# Patient Record
Sex: Female | Born: 1935 | Race: White | Hispanic: No | Smoking: Never smoker
Health system: Southern US, Community
[De-identification: ages and names within clinical notes are randomized; demographics above are authoritative.]

## PROBLEM LIST (undated history)

## (undated) DIAGNOSIS — Z974 Presence of external hearing-aid: Secondary | ICD-10-CM

## (undated) DIAGNOSIS — I10 Essential (primary) hypertension: Secondary | ICD-10-CM

## (undated) DIAGNOSIS — F32A Depression, unspecified: Secondary | ICD-10-CM

## (undated) DIAGNOSIS — G43909 Migraine, unspecified, not intractable, without status migrainosus: Secondary | ICD-10-CM

## (undated) DIAGNOSIS — F329 Major depressive disorder, single episode, unspecified: Secondary | ICD-10-CM

## (undated) DIAGNOSIS — E039 Hypothyroidism, unspecified: Secondary | ICD-10-CM

## (undated) DIAGNOSIS — M1990 Unspecified osteoarthritis, unspecified site: Secondary | ICD-10-CM

## (undated) DIAGNOSIS — C50919 Malignant neoplasm of unspecified site of unspecified female breast: Secondary | ICD-10-CM

## (undated) HISTORY — PX: JOINT REPLACEMENT: SHX530

## (undated) HISTORY — PX: BREAST LUMPECTOMY: SHX2

---

## 1997-08-19 ENCOUNTER — Ambulatory Visit (HOSPITAL_COMMUNITY): Admission: RE | Admit: 1997-08-19 | Discharge: 1997-08-19 | Payer: Self-pay | Admitting: Obstetrics & Gynecology

## 2004-01-21 ENCOUNTER — Ambulatory Visit: Payer: Self-pay | Admitting: Oncology

## 2004-05-10 ENCOUNTER — Ambulatory Visit: Payer: Self-pay | Admitting: Oncology

## 2004-06-01 ENCOUNTER — Ambulatory Visit: Payer: Self-pay | Admitting: Oncology

## 2004-10-11 ENCOUNTER — Ambulatory Visit: Payer: Self-pay | Admitting: Oncology

## 2005-01-11 ENCOUNTER — Ambulatory Visit: Payer: Self-pay | Admitting: Oncology

## 2005-04-14 ENCOUNTER — Ambulatory Visit: Payer: Self-pay | Admitting: Oncology

## 2005-05-04 ENCOUNTER — Ambulatory Visit: Payer: Self-pay | Admitting: Oncology

## 2005-10-13 ENCOUNTER — Ambulatory Visit: Payer: Self-pay | Admitting: Oncology

## 2005-11-01 ENCOUNTER — Ambulatory Visit: Payer: Self-pay | Admitting: Oncology

## 2006-01-12 ENCOUNTER — Ambulatory Visit: Payer: Self-pay | Admitting: Oncology

## 2006-04-17 ENCOUNTER — Ambulatory Visit: Payer: Self-pay | Admitting: Oncology

## 2006-04-21 ENCOUNTER — Ambulatory Visit: Payer: Self-pay | Admitting: Internal Medicine

## 2006-05-04 ENCOUNTER — Ambulatory Visit: Payer: Self-pay | Admitting: Oncology

## 2006-06-13 ENCOUNTER — Encounter: Payer: Self-pay | Admitting: Unknown Physician Specialty

## 2006-07-03 ENCOUNTER — Encounter: Payer: Self-pay | Admitting: Unknown Physician Specialty

## 2006-07-20 ENCOUNTER — Ambulatory Visit: Payer: Self-pay | Admitting: Physician Assistant

## 2006-10-02 ENCOUNTER — Ambulatory Visit: Payer: Self-pay | Admitting: Oncology

## 2006-10-15 ENCOUNTER — Ambulatory Visit: Payer: Self-pay | Admitting: Oncology

## 2006-11-02 ENCOUNTER — Ambulatory Visit: Payer: Self-pay | Admitting: Oncology

## 2007-01-14 ENCOUNTER — Ambulatory Visit: Payer: Self-pay | Admitting: Oncology

## 2007-01-23 ENCOUNTER — Emergency Department: Payer: Self-pay | Admitting: Emergency Medicine

## 2007-04-04 ENCOUNTER — Ambulatory Visit: Payer: Self-pay | Admitting: Oncology

## 2007-05-02 ENCOUNTER — Ambulatory Visit: Payer: Self-pay | Admitting: Oncology

## 2007-05-05 ENCOUNTER — Ambulatory Visit: Payer: Self-pay | Admitting: Oncology

## 2007-09-05 ENCOUNTER — Ambulatory Visit: Payer: Self-pay | Admitting: Oncology

## 2007-10-02 ENCOUNTER — Ambulatory Visit: Payer: Self-pay | Admitting: Oncology

## 2007-10-24 ENCOUNTER — Ambulatory Visit: Payer: Self-pay | Admitting: Oncology

## 2007-10-30 ENCOUNTER — Ambulatory Visit: Payer: Self-pay | Admitting: Unknown Physician Specialty

## 2007-11-02 ENCOUNTER — Ambulatory Visit: Payer: Self-pay | Admitting: Oncology

## 2008-01-16 ENCOUNTER — Ambulatory Visit: Payer: Self-pay | Admitting: Oncology

## 2008-04-07 ENCOUNTER — Ambulatory Visit: Payer: Self-pay | Admitting: Internal Medicine

## 2008-05-04 ENCOUNTER — Ambulatory Visit: Payer: Self-pay | Admitting: Oncology

## 2008-05-22 ENCOUNTER — Ambulatory Visit: Payer: Self-pay | Admitting: Oncology

## 2008-06-01 ENCOUNTER — Ambulatory Visit: Payer: Self-pay | Admitting: Oncology

## 2008-10-14 ENCOUNTER — Ambulatory Visit: Payer: Self-pay | Admitting: Unknown Physician Specialty

## 2008-11-17 ENCOUNTER — Ambulatory Visit: Payer: Self-pay | Admitting: Oncology

## 2008-12-02 ENCOUNTER — Ambulatory Visit: Payer: Self-pay | Admitting: Oncology

## 2009-01-18 ENCOUNTER — Ambulatory Visit: Payer: Self-pay | Admitting: Oncology

## 2009-05-04 ENCOUNTER — Ambulatory Visit: Payer: Self-pay | Admitting: Oncology

## 2009-05-25 ENCOUNTER — Ambulatory Visit: Payer: Self-pay | Admitting: Oncology

## 2009-06-01 ENCOUNTER — Ambulatory Visit: Payer: Self-pay | Admitting: Oncology

## 2010-01-19 ENCOUNTER — Ambulatory Visit: Payer: Self-pay | Admitting: Oncology

## 2010-01-26 ENCOUNTER — Ambulatory Visit: Payer: Self-pay | Admitting: Oncology

## 2010-02-01 ENCOUNTER — Ambulatory Visit: Payer: Self-pay | Admitting: Oncology

## 2010-10-11 ENCOUNTER — Emergency Department: Payer: Self-pay | Admitting: Emergency Medicine

## 2010-11-28 ENCOUNTER — Emergency Department: Payer: Self-pay | Admitting: Emergency Medicine

## 2011-01-23 ENCOUNTER — Ambulatory Visit: Payer: Self-pay | Admitting: Oncology

## 2011-01-31 ENCOUNTER — Ambulatory Visit: Payer: Self-pay | Admitting: Oncology

## 2011-02-17 ENCOUNTER — Ambulatory Visit: Payer: Self-pay | Admitting: Oncology

## 2011-03-04 ENCOUNTER — Ambulatory Visit: Payer: Self-pay | Admitting: Oncology

## 2011-05-27 ENCOUNTER — Emergency Department: Payer: Self-pay | Admitting: Emergency Medicine

## 2011-05-27 LAB — COMPREHENSIVE METABOLIC PANEL
BUN: 25 mg/dL — ABNORMAL HIGH (ref 7–18)
Bilirubin,Total: 0.8 mg/dL (ref 0.2–1.0)
Calcium, Total: 9.4 mg/dL (ref 8.5–10.1)
Chloride: 105 mmol/L (ref 98–107)
Co2: 27 mmol/L (ref 21–32)
Creatinine: 0.97 mg/dL (ref 0.60–1.30)
EGFR (African American): >60
EGFR (Non-African Amer.): 58 — ABNORMAL LOW
SGPT (ALT): 22 U/L
Sodium: 143 mmol/L (ref 136–145)
Total Protein: 7.6 g/dL (ref 6.4–8.2)

## 2011-05-27 LAB — CBC
HGB: 15.9 g/dL (ref 12.0–16.0)
MCH: 32 pg (ref 26.0–34.0)
MCV: 96 fL (ref 80–100)
Platelet: 232 10*3/uL (ref 150–440)
RBC: 4.96 10*6/uL (ref 3.80–5.20)
WBC: 12.4 10*3/uL — ABNORMAL HIGH (ref 3.6–11.0)

## 2011-05-27 LAB — URINALYSIS, COMPLETE
Bilirubin,UR: NEGATIVE
Blood: NEGATIVE
Ketone: NEGATIVE
Ph: 7 (ref 4.5–8.0)
Squamous Epithelial: <1

## 2012-01-31 ENCOUNTER — Ambulatory Visit: Payer: Self-pay | Admitting: Oncology

## 2012-02-16 ENCOUNTER — Ambulatory Visit: Payer: Self-pay | Admitting: Oncology

## 2012-02-16 LAB — COMPREHENSIVE METABOLIC PANEL
Albumin: 3.7 g/dL (ref 3.4–5.0)
Anion Gap: 7 (ref 7–16)
BUN: 22 mg/dL — ABNORMAL HIGH (ref 7–18)
Bilirubin,Total: 0.5 mg/dL (ref 0.2–1.0)
Co2: 31 mmol/L (ref 21–32)
Creatinine: 1.05 mg/dL (ref 0.60–1.30)
EGFR (Non-African Amer.): 47 — ABNORMAL LOW
Glucose: 90 mg/dL (ref 65–99)
Osmolality: 288 (ref 275–301)
Potassium: 3.9 mmol/L (ref 3.5–5.1)
SGOT(AST): 21 U/L (ref 15–37)
Sodium: 143 mmol/L (ref 136–145)
Total Protein: 7 g/dL (ref 6.4–8.2)

## 2012-02-16 LAB — CBC CANCER CENTER
Basophil %: 0.9 %
Eosinophil %: 3.2 %
HCT: 44.6 % (ref 35.0–47.0)
HGB: 15.1 g/dL (ref 12.0–16.0)
Lymphocyte #: 1.4 x10 3/mm (ref 1.0–3.6)
MCH: 32 pg (ref 26.0–34.0)
MCV: 95 fL (ref 80–100)
Monocyte #: 0.5 x10 3/mm (ref 0.2–0.9)
Monocyte %: 7.4 %
Neutrophil #: 4 x10 3/mm (ref 1.4–6.5)
RBC: 4.72 10*6/uL (ref 3.80–5.20)
RDW: 13.2 % (ref 11.5–14.5)
WBC: 6.1 x10 3/mm (ref 3.6–11.0)

## 2012-02-18 LAB — CANCER ANTIGEN 27.29: CA 27.29: 23.4 U/mL (ref 0.0–38.6)

## 2012-03-03 ENCOUNTER — Ambulatory Visit: Payer: Self-pay | Admitting: Oncology

## 2013-02-11 ENCOUNTER — Ambulatory Visit: Payer: Self-pay | Admitting: Oncology

## 2013-03-11 ENCOUNTER — Ambulatory Visit: Payer: Self-pay | Admitting: Oncology

## 2013-03-11 LAB — CBC CANCER CENTER
Basophil #: 0.1 x10 3/mm (ref 0.0–0.1)
Basophil %: 0.9 %
Eosinophil #: 0.2 x10 3/mm (ref 0.0–0.7)
HGB: 14.7 g/dL (ref 12.0–16.0)
Lymphocyte #: 2.2 x10 3/mm (ref 1.0–3.6)
MCH: 31.5 pg (ref 26.0–34.0)
MCHC: 33.1 g/dL (ref 32.0–36.0)
MCV: 95 fL (ref 80–100)
Neutrophil %: 62.4 %
RBC: 4.67 10*6/uL (ref 3.80–5.20)
RDW: 12.9 % (ref 11.5–14.5)

## 2013-03-11 LAB — COMPREHENSIVE METABOLIC PANEL
Albumin: 3.8 g/dL (ref 3.4–5.0)
Alkaline Phosphatase: 84 U/L
BUN: 24 mg/dL — ABNORMAL HIGH (ref 7–18)
Bilirubin,Total: 0.5 mg/dL (ref 0.2–1.0)
Calcium, Total: 9.8 mg/dL (ref 8.5–10.1)
Co2: 29 mmol/L (ref 21–32)
EGFR (Non-African Amer.): 59 — ABNORMAL LOW
Osmolality: 281 (ref 275–301)
Potassium: 4.2 mmol/L (ref 3.5–5.1)
SGOT(AST): 24 U/L (ref 15–37)
SGPT (ALT): 23 U/L (ref 12–78)
Total Protein: 7.4 g/dL (ref 6.4–8.2)

## 2013-04-03 ENCOUNTER — Ambulatory Visit: Payer: Self-pay | Admitting: Oncology

## 2013-07-17 ENCOUNTER — Ambulatory Visit: Payer: Self-pay | Admitting: General Practice

## 2014-02-12 ENCOUNTER — Ambulatory Visit: Payer: Self-pay | Admitting: Internal Medicine

## 2015-06-16 ENCOUNTER — Encounter: Payer: Self-pay | Admitting: *Deleted

## 2015-06-17 NOTE — Discharge Instructions (Signed)

## 2015-06-21 ENCOUNTER — Encounter
Admission: RE | Disposition: A | Discharge status: Home / Self Care | Payer: Self-pay | Source: Ambulatory Visit | Attending: Ophthalmology

## 2015-06-21 ENCOUNTER — Ambulatory Visit: Payer: Medicare Other | Admitting: Anesthesiology

## 2015-06-21 ENCOUNTER — Ambulatory Visit
Admission: RE | Admit: 2015-06-21 | Discharge: 2015-06-21 | Disposition: A | Discharge status: Home / Self Care | Payer: Medicare Other | Source: Ambulatory Visit | Attending: Ophthalmology | Admitting: Ophthalmology

## 2015-06-21 DIAGNOSIS — I10 Essential (primary) hypertension: Secondary | ICD-10-CM | POA: Diagnosis not present

## 2015-06-21 DIAGNOSIS — Z880 Allergy status to penicillin: Secondary | ICD-10-CM | POA: Diagnosis not present

## 2015-06-21 DIAGNOSIS — Z7982 Long term (current) use of aspirin: Secondary | ICD-10-CM | POA: Insufficient documentation

## 2015-06-21 DIAGNOSIS — Z853 Personal history of malignant neoplasm of breast: Secondary | ICD-10-CM | POA: Insufficient documentation

## 2015-06-21 DIAGNOSIS — H9193 Unspecified hearing loss, bilateral: Secondary | ICD-10-CM | POA: Diagnosis not present

## 2015-06-21 DIAGNOSIS — Z888 Allergy status to other drugs, medicaments and biological substances status: Secondary | ICD-10-CM | POA: Insufficient documentation

## 2015-06-21 DIAGNOSIS — Z9889 Other specified postprocedural states: Secondary | ICD-10-CM | POA: Insufficient documentation

## 2015-06-21 DIAGNOSIS — H269 Unspecified cataract: Secondary | ICD-10-CM | POA: Diagnosis present

## 2015-06-21 DIAGNOSIS — Z79899 Other long term (current) drug therapy: Secondary | ICD-10-CM | POA: Diagnosis not present

## 2015-06-21 DIAGNOSIS — Z885 Allergy status to narcotic agent status: Secondary | ICD-10-CM | POA: Diagnosis not present

## 2015-06-21 DIAGNOSIS — H2512 Age-related nuclear cataract, left eye: Secondary | ICD-10-CM | POA: Diagnosis not present

## 2015-06-21 DIAGNOSIS — Z91048 Other nonmedicinal substance allergy status: Secondary | ICD-10-CM | POA: Insufficient documentation

## 2015-06-21 DIAGNOSIS — Z96642 Presence of left artificial hip joint: Secondary | ICD-10-CM | POA: Diagnosis not present

## 2015-06-21 DIAGNOSIS — E079 Disorder of thyroid, unspecified: Secondary | ICD-10-CM | POA: Insufficient documentation

## 2015-06-21 HISTORY — DX: Essential (primary) hypertension: I10

## 2015-06-21 HISTORY — PX: CATARACT EXTRACTION W/PHACO: SHX586

## 2015-06-21 HISTORY — DX: Depression, unspecified: F32.A

## 2015-06-21 HISTORY — DX: Unspecified osteoarthritis, unspecified site: M19.90

## 2015-06-21 HISTORY — DX: Migraine, unspecified, not intractable, without status migrainosus: G43.909

## 2015-06-21 HISTORY — DX: Hypothyroidism, unspecified: E03.9

## 2015-06-21 HISTORY — DX: Malignant neoplasm of unspecified site of unspecified female breast: C50.919

## 2015-06-21 HISTORY — DX: Presence of external hearing-aid: Z97.4

## 2015-06-21 HISTORY — DX: Major depressive disorder, single episode, unspecified: F32.9

## 2015-06-21 SURGERY — PHACOEMULSIFICATION, CATARACT, WITH IOL INSERTION
Anesthesia: Monitor Anesthesia Care | Site: Eye | Laterality: Left | Wound class: Clean

## 2015-06-21 MED ORDER — LIDOCAINE HCL (PF) 4 % IJ SOLN
INTRAOCULAR | Status: DC | PRN
  Administered 2015-06-21: 4 mL via OPHTHALMIC

## 2015-06-21 MED ORDER — MOXIFLOXACIN HCL 0.5 % OP SOLN
OPHTHALMIC | Status: DC | PRN
  Administered 2015-06-21: 1 [drp] via OPHTHALMIC

## 2015-06-21 MED ORDER — ARMC OPHTHALMIC DILATING GEL
1.0000 "application " | OPHTHALMIC | Status: DC | PRN
  Administered 2015-06-21 (×2): 1 via OPHTHALMIC

## 2015-06-21 MED ORDER — MIDAZOLAM HCL 2 MG/2ML IJ SOLN
INTRAMUSCULAR | Status: DC | PRN
  Administered 2015-06-21: 1.5 mg via INTRAVENOUS

## 2015-06-21 MED ORDER — TIMOLOL MALEATE 0.5 % OP SOLN
OPHTHALMIC | Status: DC | PRN
  Administered 2015-06-21: 1 [drp] via OPHTHALMIC

## 2015-06-21 MED ORDER — TETRACAINE HCL 0.5 % OP SOLN
1.0000 [drp] | OPHTHALMIC | Status: DC | PRN
  Administered 2015-06-21: 1 [drp] via OPHTHALMIC

## 2015-06-21 MED ORDER — ACETAMINOPHEN 160 MG/5ML PO SOLN: 325.0000 mg | ORAL | Status: DC | PRN

## 2015-06-21 MED ORDER — ACETAMINOPHEN 325 MG PO TABS: 325.0000 mg | ORAL_TABLET | ORAL | Status: DC | PRN

## 2015-06-21 MED ORDER — NA HYALUR & NA CHOND-NA HYALUR 0.4-0.35 ML IO KIT
PACK | INTRAOCULAR | Status: DC | PRN
  Administered 2015-06-21: 1 mL via INTRAOCULAR

## 2015-06-21 MED ORDER — POVIDONE-IODINE 5 % OP SOLN
1.0000 "application " | OPHTHALMIC | Status: DC | PRN
  Administered 2015-06-21: 1 via OPHTHALMIC

## 2015-06-21 MED ORDER — BSS IO SOLN
INTRAOCULAR | Status: DC | PRN
  Administered 2015-06-21: 78 mL via OPHTHALMIC

## 2015-06-21 MED ORDER — BRIMONIDINE TARTRATE 0.2 % OP SOLN
OPHTHALMIC | Status: DC | PRN
  Administered 2015-06-21: 1 [drp] via OPHTHALMIC

## 2015-06-21 MED ORDER — LACTATED RINGERS IV SOLN: INTRAVENOUS | Status: DC

## 2015-06-21 MED ORDER — FENTANYL CITRATE (PF) 100 MCG/2ML IJ SOLN
INTRAMUSCULAR | Status: DC | PRN
  Administered 2015-06-21: 50 ug via INTRAVENOUS

## 2015-06-21 SURGICAL SUPPLY — 31 items
APL FBRTP 3 NS LF CTTN WD (MISCELLANEOUS) ×1
APPLICATOR COTTON TIP 3IN (MISCELLANEOUS) ×3 IMPLANT
CANNULA ANT/CHMB 27G (MISCELLANEOUS) ×1 IMPLANT
CANNULA ANT/CHMB 27GA (MISCELLANEOUS) ×3 IMPLANT
DISSECTOR HYDRO NUCLEUS 50X22 (MISCELLANEOUS) ×3 IMPLANT
GLOVE BIO SURGEON STRL SZ7 (GLOVE) ×3 IMPLANT
GLOVE SURG LX 6.5 MICRO (GLOVE) ×2
GLOVE SURG LX STRL 6.5 MICRO (GLOVE) ×1 IMPLANT
GOWN STRL REUS W/ TWL LRG LVL3 (GOWN DISPOSABLE) ×2 IMPLANT
GOWN STRL REUS W/TWL LRG LVL3 (GOWN DISPOSABLE) ×6
LENS IOL ACRYSOF IQ 21.5 (Intraocular Lens) ×2 IMPLANT
MARKER SKIN DUAL TIP RULER LAB (MISCELLANEOUS) ×3 IMPLANT
NDL FILTER BLUNT 18X1 1/2 (NEEDLE) ×1 IMPLANT
NEEDLE FILTER BLUNT 18X 1/2SAF (NEEDLE) ×2
NEEDLE FILTER BLUNT 18X1 1/2 (NEEDLE) ×1 IMPLANT
PACK CATARACT BRASINGTON (MISCELLANEOUS) ×3 IMPLANT
PACK EYE AFTER SURG (MISCELLANEOUS) ×3 IMPLANT
PACK OPTHALMIC (MISCELLANEOUS) ×3 IMPLANT
RING MALYGIN 7.0 (MISCELLANEOUS) IMPLANT
SOL BAL SALT 15ML (MISCELLANEOUS)
SOLUTION BAL SALT 15ML (MISCELLANEOUS) IMPLANT
SUT ETHILON 10-0 CS-B-6CS-B-6 (SUTURE)
SUT VICRYL  9 0 (SUTURE)
SUT VICRYL 9 0 (SUTURE) IMPLANT
SUTURE EHLN 10-0 CS-B-6CS-B-6 (SUTURE) IMPLANT
SYR 3ML LL SCALE MARK (SYRINGE) ×3 IMPLANT
SYR TB 1ML LUER SLIP (SYRINGE) ×3 IMPLANT
WATER STERILE IRR 250ML POUR (IV SOLUTION) ×3 IMPLANT
WATER STERILE IRR 500ML POUR (IV SOLUTION) IMPLANT
WICK EYE OCUCEL (MISCELLANEOUS) IMPLANT
WIPE NON LINTING 3.25X3.25 (MISCELLANEOUS) ×3 IMPLANT

## 2015-06-21 NOTE — Transfer of Care (Signed)
Immediate Anesthesia Transfer of Care Note  Patient: Destiny Neal  Procedure(s) Performed: Procedure(s): CATARACT EXTRACTION PHACO AND INTRAOCULAR LENS PLACEMENT ([facility]) (Left)  Patient Location: PACU  Anesthesia Type: MAC  Level of Consciousness: awake, alert  and patient cooperative  Airway and Oxygen Therapy: Patient Spontanous Breathing and Patient connected to supplemental oxygen  Post-op Assessment: Post-op Vital signs reviewed, Patient's Cardiovascular Status Stable, Respiratory Function Stable, Patent Airway and No signs of Nausea or vomiting  Post-op Vital Signs: Reviewed and stable  Complications: No apparent anesthesia complications

## 2015-06-21 NOTE — Anesthesia Procedure Notes (Signed)
Procedure Name: MAC Performed by: Ramzy Cappelletti Pre-anesthesia Checklist: Patient identified, Emergency Drugs available, Suction available, Timeout performed and Patient being monitored Patient Re-evaluated:Patient Re-evaluated prior to inductionOxygen Delivery Method: Nasal cannula Placement Confirmation: positive ETCO2       

## 2015-06-21 NOTE — Anesthesia Postprocedure Evaluation (Signed)
Anesthesia Post Note  Patient: Destiny Neal  Procedure(s) Performed: Procedure(s) (LRB): CATARACT EXTRACTION PHACO AND INTRAOCULAR LENS PLACEMENT ([place]) (Left)  Patient location during evaluation: PACU Anesthesia Type: MAC Level of consciousness: awake and alert Pain management: pain level controlled Vital Signs Assessment: post-procedure vital signs reviewed and stable Respiratory status: spontaneous breathing, nonlabored ventilation and respiratory function stable Cardiovascular status: stable and blood pressure returned to baseline Anesthetic complications: no    Trecia Rogers

## 2015-06-21 NOTE — H&P (Signed)
H+P reviewed and is up to date, please see paper chart.  

## 2015-06-21 NOTE — Op Note (Signed)
Date of Surgery: 06/21/2015  PREOPERATIVE DIAGNOSES: Visually significant nuclear sclerotic cataract, left eye.  POSTOPERATIVE DIAGNOSES: Same  PROCEDURES PERFORMED: Cataract extraction with intraocular lens implant, left eye.  SURGEON: Almon Hercules, M.D.  ANESTHESIA: MAC and topical  IMPLANTS: UltraSert +21.5 D  Implant Name Type Inv. Item Serial No. Manufacturer Lot No. LRB No. Used  acrysof iq ultrasert pre-loaded     [id number] [employer]   Left 1    COMPLICATIONS: None.  DESCRIPTION OF PROCEDURE: Therapeutic options were discussed with the patient preoperatively, including a discussion of risks and benefits of surgery. Informed consent was obtained. An IOL-Master and immersion biometry were used to take the lens measurements, and a dilated fundus exam was performed within 6 months of the surgical date.  The patient was premedicated and brought to the operating room and placed on the operating table in the supine position. After adequate anesthesia, the patient was prepped and draped in the usual sterile ophthalmic fashion. A wire lid speculum was inserted and the microscope was positioned. A Superblade was used to create a paracentesis site at the limbus and a small amount of dilute preservative free lidocaine was instilled into the anterior chamber, followed by dispersive viscoelastic. A clear corneal incision was created temporally using a 2.4 mm keratome blade. Capsulorrhexis was then performed. In situ phacoemulsification was performed.  Cortical material was removed with the irrigation-aspiration unit. Dispersive viscoelastic was instilled to open the capsular bag. A posterior chamber intraocular lens with the specifications above was inserted and positioned. Irrigation-aspiration was used to remove all viscoelastic. Vigamox 1cc was instilled into the anterior chamber, and the corneal incision was checked and found to be water tight. The eyelid speculum was removed.  The operative eye  was covered with protective goggles after instilling 1 drop of timolol and brimonidine. The patient tolerated the procedure well. There were no complications.

## 2015-06-21 NOTE — Anesthesia Preprocedure Evaluation (Signed)
Anesthesia Evaluation  Patient identified by MRN, date of birth, ID band Patient awake    Reviewed: Allergy & Precautions, H&P , NPO status , Patient's Chart, lab work & pertinent test results, reviewed documented beta blocker date and time   Airway Mallampati: III  TM Distance: >3 FB Neck ROM: full    Dental no notable dental hx.    Pulmonary neg pulmonary ROS,    Pulmonary exam normal breath sounds clear to auscultation       Cardiovascular Exercise Tolerance: Good hypertension, Normal cardiovascular exam Rhythm:regular Rate:Normal     Neuro/Psych PSYCHIATRIC DISORDERS negative neurological ROS     GI/Hepatic negative GI ROS, Neg liver ROS,   Endo/Other  Hypothyroidism   Renal/GU negative Renal ROS  negative genitourinary   Musculoskeletal   Abdominal   Peds  Hematology negative hematology ROS (+)   Anesthesia Other Findings   Reproductive/Obstetrics negative OB ROS                             Anesthesia Physical Anesthesia Plan  ASA: II  Anesthesia Plan: MAC   Post-op Pain Management:    Induction: Intravenous  Airway Management Planned: Nasal Cannula  Additional Equipment:   Intra-op Plan:   Post-operative Plan:   Informed Consent: I have reviewed the patients History and Physical, chart, labs and discussed the procedure including the risks, benefits and alternatives for the proposed anesthesia with the patient or authorized representative who has indicated his/her understanding and acceptance.   Dental Advisory Given  Plan Discussed with: CRNA  Anesthesia Plan Comments:         Anesthesia Quick Evaluation

## 2015-06-22 ENCOUNTER — Encounter: Payer: Self-pay | Admitting: Ophthalmology

## 2015-07-07 ENCOUNTER — Encounter: Payer: Self-pay | Admitting: *Deleted

## 2015-07-09 NOTE — Discharge Instructions (Signed)

## 2015-07-12 ENCOUNTER — Ambulatory Visit: Payer: Medicare Other | Admitting: Anesthesiology

## 2015-07-12 ENCOUNTER — Ambulatory Visit
Admission: RE | Admit: 2015-07-12 | Discharge: 2015-07-12 | Disposition: A | Discharge status: Home / Self Care | Payer: Medicare Other | Source: Ambulatory Visit | Attending: Ophthalmology | Admitting: Ophthalmology

## 2015-07-12 ENCOUNTER — Encounter
Admission: RE | Disposition: A | Discharge status: Home / Self Care | Payer: Self-pay | Source: Ambulatory Visit | Attending: Ophthalmology

## 2015-07-12 DIAGNOSIS — F329 Major depressive disorder, single episode, unspecified: Secondary | ICD-10-CM | POA: Insufficient documentation

## 2015-07-12 DIAGNOSIS — Z7982 Long term (current) use of aspirin: Secondary | ICD-10-CM | POA: Diagnosis not present

## 2015-07-12 DIAGNOSIS — I10 Essential (primary) hypertension: Secondary | ICD-10-CM | POA: Diagnosis not present

## 2015-07-12 DIAGNOSIS — Z880 Allergy status to penicillin: Secondary | ICD-10-CM | POA: Insufficient documentation

## 2015-07-12 DIAGNOSIS — Z9889 Other specified postprocedural states: Secondary | ICD-10-CM | POA: Diagnosis not present

## 2015-07-12 DIAGNOSIS — H2511 Age-related nuclear cataract, right eye: Secondary | ICD-10-CM | POA: Diagnosis not present

## 2015-07-12 DIAGNOSIS — H9193 Unspecified hearing loss, bilateral: Secondary | ICD-10-CM | POA: Diagnosis not present

## 2015-07-12 DIAGNOSIS — Z888 Allergy status to other drugs, medicaments and biological substances status: Secondary | ICD-10-CM | POA: Diagnosis not present

## 2015-07-12 DIAGNOSIS — Z96642 Presence of left artificial hip joint: Secondary | ICD-10-CM | POA: Diagnosis not present

## 2015-07-12 DIAGNOSIS — Z885 Allergy status to narcotic agent status: Secondary | ICD-10-CM | POA: Insufficient documentation

## 2015-07-12 DIAGNOSIS — H269 Unspecified cataract: Secondary | ICD-10-CM | POA: Diagnosis present

## 2015-07-12 DIAGNOSIS — E039 Hypothyroidism, unspecified: Secondary | ICD-10-CM | POA: Insufficient documentation

## 2015-07-12 DIAGNOSIS — Z79899 Other long term (current) drug therapy: Secondary | ICD-10-CM | POA: Insufficient documentation

## 2015-07-12 HISTORY — PX: CATARACT EXTRACTION W/PHACO: SHX586

## 2015-07-12 SURGERY — PHACOEMULSIFICATION, CATARACT, WITH IOL INSERTION
Anesthesia: Monitor Anesthesia Care | Site: Eye | Laterality: Right | Wound class: Clean

## 2015-07-12 MED ORDER — LACTATED RINGERS IV SOLN: INTRAVENOUS | Status: DC

## 2015-07-12 MED ORDER — FENTANYL CITRATE (PF) 100 MCG/2ML IJ SOLN
INTRAMUSCULAR | Status: DC | PRN
  Administered 2015-07-12: 50 ug via INTRAVENOUS
  Administered 2015-07-12 (×2): 25 ug via INTRAVENOUS

## 2015-07-12 MED ORDER — NA HYALUR & NA CHOND-NA HYALUR 0.4-0.35 ML IO KIT
PACK | INTRAOCULAR | Status: DC | PRN
Start: 2015-07-12 — End: 2015-07-12
  Administered 2015-07-12: 1 mL via INTRAOCULAR

## 2015-07-12 MED ORDER — ARMC OPHTHALMIC DILATING GEL
1.0000 | OPHTHALMIC | Status: DC | PRN
Start: 2015-07-12 — End: 2015-07-12
  Administered 2015-07-12 (×2): 1 via OPHTHALMIC

## 2015-07-12 MED ORDER — ACETAMINOPHEN 325 MG PO TABS: 325.0000 mg | ORAL_TABLET | ORAL | Status: DC | PRN

## 2015-07-12 MED ORDER — TETRACAINE HCL 0.5 % OP SOLN
1.0000 [drp] | OPHTHALMIC | Status: DC | PRN
  Administered 2015-07-12: 1 [drp] via OPHTHALMIC

## 2015-07-12 MED ORDER — BRIMONIDINE TARTRATE 0.2 % OP SOLN
OPHTHALMIC | Status: DC | PRN
  Administered 2015-07-12: 1 [drp] via OPHTHALMIC

## 2015-07-12 MED ORDER — MOXIFLOXACIN HCL 0.5 % OP SOLN
OPHTHALMIC | Status: DC | PRN
  Administered 2015-07-12: 1 [drp] via OPHTHALMIC

## 2015-07-12 MED ORDER — ACETAMINOPHEN 160 MG/5ML PO SOLN: 325.0000 mg | ORAL | Status: DC | PRN

## 2015-07-12 MED ORDER — POVIDONE-IODINE 5 % OP SOLN
1.0000 "application " | OPHTHALMIC | Status: DC | PRN
  Administered 2015-07-12: 1 via OPHTHALMIC

## 2015-07-12 MED ORDER — LIDOCAINE HCL (PF) 4 % IJ SOLN
INTRAMUSCULAR | Status: DC | PRN
  Administered 2015-07-12: 1 mL via OPHTHALMIC

## 2015-07-12 MED ORDER — EPINEPHRINE HCL 1 MG/ML IJ SOLN
INTRAMUSCULAR | Status: DC | PRN
  Administered 2015-07-12: 138 mL via OPHTHALMIC

## 2015-07-12 SURGICAL SUPPLY — 31 items
APL FBRTP 3 NS LF CTTN WD (MISCELLANEOUS) ×1
APPLICATOR COTTON TIP 3IN (MISCELLANEOUS) ×3 IMPLANT
CANNULA ANT/CHMB 27G (MISCELLANEOUS) ×1 IMPLANT
CANNULA ANT/CHMB 27GA (MISCELLANEOUS) ×3 IMPLANT
DISSECTOR HYDRO NUCLEUS 50X22 (MISCELLANEOUS) ×3 IMPLANT
GLOVE BIO SURGEON STRL SZ7 (GLOVE) ×3 IMPLANT
GLOVE SURG LX 6.5 MICRO (GLOVE) ×2
GLOVE SURG LX STRL 6.5 MICRO (GLOVE) ×1 IMPLANT
GOWN STRL REUS W/ TWL LRG LVL3 (GOWN DISPOSABLE) ×2 IMPLANT
GOWN STRL REUS W/TWL LRG LVL3 (GOWN DISPOSABLE) ×6
LENS IOL ACRYSOF IQ 22.0 (Intraocular Lens) ×2 IMPLANT
MARKER SKIN DUAL TIP RULER LAB (MISCELLANEOUS) ×3 IMPLANT
NDL FILTER BLUNT 18X1 1/2 (NEEDLE) ×1 IMPLANT
NEEDLE FILTER BLUNT 18X 1/2SAF (NEEDLE) ×2
NEEDLE FILTER BLUNT 18X1 1/2 (NEEDLE) ×1 IMPLANT
PACK CATARACT BRASINGTON (MISCELLANEOUS) ×3 IMPLANT
PACK EYE AFTER SURG (MISCELLANEOUS) ×3 IMPLANT
PACK OPTHALMIC (MISCELLANEOUS) ×3 IMPLANT
RING MALYGIN 7.0 (MISCELLANEOUS) IMPLANT
SOL BAL SALT 15ML (MISCELLANEOUS)
SOLUTION BAL SALT 15ML (MISCELLANEOUS) IMPLANT
SUT ETHILON 10-0 CS-B-6CS-B-6 (SUTURE)
SUT VICRYL  9 0 (SUTURE)
SUT VICRYL 9 0 (SUTURE) IMPLANT
SUTURE EHLN 10-0 CS-B-6CS-B-6 (SUTURE) IMPLANT
SYR 3ML LL SCALE MARK (SYRINGE) ×3 IMPLANT
SYR TB 1ML LUER SLIP (SYRINGE) ×3 IMPLANT
WATER STERILE IRR 250ML POUR (IV SOLUTION) ×3 IMPLANT
WATER STERILE IRR 500ML POUR (IV SOLUTION) IMPLANT
WICK EYE OCUCEL (MISCELLANEOUS) ×2 IMPLANT
WIPE NON LINTING 3.25X3.25 (MISCELLANEOUS) ×3 IMPLANT

## 2015-07-12 NOTE — Anesthesia Procedure Notes (Signed)
Procedure Name: MAC Performed by: Winston Misner Pre-anesthesia Checklist: Patient identified, Emergency Drugs available, Suction available, Timeout performed and Patient being monitored Patient Re-evaluated:Patient Re-evaluated prior to inductionOxygen Delivery Method: Nasal cannula Placement Confirmation: positive ETCO2     

## 2015-07-12 NOTE — Op Note (Signed)
Date of Surgery: 07/12/2015  PREOPERATIVE DIAGNOSES: Visually significant nuclear sclerotic cataract, right eye.  POSTOPERATIVE DIAGNOSES: Same  PROCEDURES PERFORMED: Cataract extraction with intraocular lens implant, right eye.  SURGEON: Almon Hercules, M.D.  ANESTHESIA: MAC and topical  IMPLANTS: [id number] +22.0 D   Implant Name Type Inv. Item Serial No. Manufacturer Lot No. LRB No. Used  [id number].IQ IOL PRE-LOADED     [id number] [facility]   Right 1     COMPLICATIONS: None.  DESCRIPTION OF PROCEDURE: Therapeutic options were discussed with the patient preoperatively, including a discussion of risks and benefits of surgery. Informed consent was obtained. An IOL-Master and immersion biometry were used to take the lens measurements, and a dilated fundus exam was performed within 6 months of the surgical date.  The patient was premedicated and brought to the operating room and placed on the operating table in the supine position. After adequate anesthesia, the patient was prepped and draped in the usual sterile ophthalmic fashion. A wire lid speculum was inserted and the microscope was positioned. A Superblade was used to create a paracentesis site at the limbus and a small amount of dilute preservative free lidocaine was instilled into the anterior chamber, followed by dispersive viscoelastic. A clear corneal incision was created temporally using a 2.4 mm keratome blade. Capsulorrhexis was then performed. In situ phacoemulsification was performed.  Cortical material was removed with the irrigation-aspiration unit. Dispersive viscoelastic was instilled to open the capsular bag. A posterior chamber intraocular lens with the specifications above was inserted and positioned. Irrigation-aspiration was used to remove all viscoelastic. Cefuroxime 1cc was instilled into the anterior chamber, and the corneal incision was checked and found to be water tight. The eyelid speculum was removed.  The  operative eye was covered with protective goggles after instilling 1 drop of timolol and brimonidine. The patient tolerated the procedure well. There were no complications.

## 2015-07-12 NOTE — Anesthesia Preprocedure Evaluation (Signed)
Anesthesia Evaluation  Patient identified by MRN, date of birth, ID band Patient awake    Reviewed: Allergy & Precautions, H&P , NPO status , Patient's Chart, lab work & pertinent test results  Airway Mallampati: II  TM Distance: >3 FB Neck ROM: full    Dental no notable dental hx.    Pulmonary neg pulmonary ROS,    Pulmonary exam normal        Cardiovascular hypertension, On Medications Normal cardiovascular exam     Neuro/Psych    GI/Hepatic negative GI ROS, Neg liver ROS,   Endo/Other  Hypothyroidism   Renal/GU negative Renal ROS     Musculoskeletal   Abdominal   Peds  Hematology negative hematology ROS (+)   Anesthesia Other Findings   Reproductive/Obstetrics                             Anesthesia Physical Anesthesia Plan  ASA: II  Anesthesia Plan: MAC   Post-op Pain Management:    Induction:   Airway Management Planned:   Additional Equipment:   Intra-op Plan:   Post-operative Plan:   Informed Consent: I have reviewed the patients History and Physical, chart, labs and discussed the procedure including the risks, benefits and alternatives for the proposed anesthesia with the patient or authorized representative who has indicated his/her understanding and acceptance.     Plan Discussed with: CRNA  Anesthesia Plan Comments:         Anesthesia Quick Evaluation

## 2015-07-12 NOTE — Transfer of Care (Signed)
Immediate Anesthesia Transfer of Care Note  Patient: Destiny Neal  Procedure(s) Performed: Procedure(s): CATARACT EXTRACTION PHACO AND INTRAOCULAR LENS PLACEMENT ([facility]) right (Right)  Patient Location: PACU  Anesthesia Type: MAC  Level of Consciousness: awake, alert  and patient cooperative  Airway and Oxygen Therapy: Patient Spontanous Breathing and Patient connected to supplemental oxygen  Post-op Assessment: Post-op Vital signs reviewed, Patient's Cardiovascular Status Stable, Respiratory Function Stable, Patent Airway and No signs of Nausea or vomiting  Post-op Vital Signs: Reviewed and stable  Complications: No apparent anesthesia complications

## 2015-07-12 NOTE — Anesthesia Postprocedure Evaluation (Signed)
Anesthesia Post Note  Patient: Destiny Neal  Procedure(s) Performed: Procedure(s) (LRB): CATARACT EXTRACTION PHACO AND INTRAOCULAR LENS PLACEMENT ([place]) right (Right)  Patient location during evaluation: PACU Anesthesia Type: MAC Level of consciousness: awake and alert Pain management: pain level controlled Vital Signs Assessment: post-procedure vital signs reviewed and stable Respiratory status: spontaneous breathing and respiratory function stable Cardiovascular status: stable Anesthetic complications: no    Jaydalynn Olivero, III,  Nicolaas Savo D

## 2015-07-13 ENCOUNTER — Emergency Department
Admission: EM | Admit: 2015-07-13 | Discharge: 2015-07-13 | Disposition: A | Discharge status: Home / Self Care | Payer: Medicare Other | Attending: Emergency Medicine | Admitting: Emergency Medicine

## 2015-07-13 ENCOUNTER — Emergency Department: Payer: Medicare Other

## 2015-07-13 ENCOUNTER — Encounter: Payer: Self-pay | Admitting: Ophthalmology

## 2015-07-13 DIAGNOSIS — M1990 Unspecified osteoarthritis, unspecified site: Secondary | ICD-10-CM | POA: Diagnosis not present

## 2015-07-13 DIAGNOSIS — F329 Major depressive disorder, single episode, unspecified: Secondary | ICD-10-CM | POA: Insufficient documentation

## 2015-07-13 DIAGNOSIS — C50919 Malignant neoplasm of unspecified site of unspecified female breast: Secondary | ICD-10-CM | POA: Diagnosis not present

## 2015-07-13 DIAGNOSIS — R519 Headache, unspecified: Secondary | ICD-10-CM

## 2015-07-13 DIAGNOSIS — E039 Hypothyroidism, unspecified: Secondary | ICD-10-CM | POA: Diagnosis not present

## 2015-07-13 DIAGNOSIS — Z7982 Long term (current) use of aspirin: Secondary | ICD-10-CM | POA: Diagnosis not present

## 2015-07-13 DIAGNOSIS — Z974 Presence of external hearing-aid: Secondary | ICD-10-CM | POA: Insufficient documentation

## 2015-07-13 DIAGNOSIS — R51 Headache: Secondary | ICD-10-CM | POA: Diagnosis present

## 2015-07-13 DIAGNOSIS — I10 Essential (primary) hypertension: Secondary | ICD-10-CM | POA: Diagnosis not present

## 2015-07-13 NOTE — ED Notes (Signed)
Pt reports headaches in the back of her head, had cataracts surgery recently

## 2015-07-13 NOTE — ED Provider Notes (Addendum)
[facility] Emergency Department Provider Note  ____________________________________________  Time seen: Approximately 2:06 PM  I have reviewed the triage vital signs and the nursing notes.   HISTORY  Chief Complaint Headache    HPI Destiny Neal is a 82 y.o. female who is quite well-appearing for her age and very functional at baseline who presents for evaluation of intermittent episodic headaches for the last 3-4 days.  She has not had any falls or trauma.  She reports that the onset is acute and the intensity is severe while it is occurring but it rarely lasts for more than 15-30 minutes.  It completely resolves and then will start again.  She describes it as a pulsatile sharp pain.  She denies visual changes, neck pain, nausea, vomiting, chest pain, shortness of breath, abdominal pain.  Nothing in particular makes it worse.  She states that many years ago she had migraines and this feels nothing like her migraines.  She reports having many bad reactions to medications so she is very cautious with which she takes; she has been taking 1 ibuprofen 200 mg tablet to help with the symptoms and says that it may help a little bitbut she is not certain.  She denies focal numbness and weakness in his had no difficulty with ambulation.  She had cataract surgery on one side about 3 weeks ago and she had the other side done yesterday.  She states that the headache started occurring after the first surgery of before the one yesterday.   Past Medical History  Diagnosis Date  . Migraines     none in 8-10 yrs  . Hypothyroidism   . Arthritis     hips  . Hypertension   . Depression   . Wears hearing aid     bilateral  . Breast cancer ([place])     There are no active problems to display for this patient.   Past Surgical History  Procedure Laterality Date  . Breast lumpectomy    . Joint replacement Left     total hip  . Cataract extraction w/phaco Left 06/21/2015   Procedure: CATARACT EXTRACTION PHACO AND INTRAOCULAR LENS PLACEMENT ([facility]);  Surgeon: Ronnell Freshwater, MD;  Location: [facility];  Service: Ophthalmology;  Laterality: Left;  . Cataract extraction w/phaco Right 07/12/2015    Procedure: CATARACT EXTRACTION PHACO AND INTRAOCULAR LENS PLACEMENT ([place]) right;  Surgeon: Ronnell Freshwater, MD;  Location: [facility];  Service: Ophthalmology;  Laterality: Right;    Current Outpatient Rx  Name  Route  Sig  Dispense  Refill  . amLODipine (NORVASC) 2.5 MG tablet   Oral   Take 2.5 mg by mouth daily.         Marland Kitchen aspirin 81 MG tablet   Oral   Take 81 mg by mouth daily.         . Calcium Citrate-Vitamin D (CALCIUM + D PO)   Oral   Take by mouth.         . dorzolamide (TRUSOPT) 2 % ophthalmic solution   Right Eye   Place 1 drop into the right eye 2 (two) times daily.         Marland Kitchen latanoprost (XALATAN) 0.005 % ophthalmic solution   Both Eyes   Place 1 drop into both eyes at bedtime.         Marland Kitchen levothyroxine (SYNTHROID, LEVOTHROID) 25 MCG tablet   Oral   Take 25 mcg by mouth daily before breakfast.         .  Multiple Vitamin (MULTIVITAMIN) capsule   Oral   Take 1 capsule by mouth daily.         . Nutritional Supplements (GRAPESEED EXTRACT PO)   Oral   Take by mouth daily.         Marland Kitchen PARoxetine (PAXIL) 10 MG tablet   Oral   Take 5 mg by mouth daily.         . vitamin B-12 (CYANOCOBALAMIN) 1000 MCG tablet   Oral   Take 2,000 mcg by mouth daily.           Allergies Lincocin; Codeine; Metronidazole; Penicillins; Tramadol; Betadine; and Iodine  History reviewed. No pertinent family history.  Social History Social History  Substance Use Topics  . Smoking status: Never Smoker   . Smokeless tobacco: None  . Alcohol Use: No    Review of Systems Constitutional: No fever/chills Eyes: No visual changes. ENT: No sore throat. Cardiovascular: Denies chest pain. Respiratory: Denies  shortness of breath. Gastrointestinal: No abdominal pain.  No nausea, no vomiting.  No diarrhea.  No constipation. Genitourinary: Negative for dysuria. Musculoskeletal: Negative for back pain. Skin: Negative for rash. Neurological: Intermittent episodic headaches  10-point ROS otherwise negative.  ____________________________________________   PHYSICAL EXAM:  VITAL SIGNS: ED Triage Vitals  Enc Vitals Group     BP 07/13/15 1300 186/98 mmHg     Pulse Rate 07/13/15 1300 64     Resp 07/13/15 1300 18     Temp 07/13/15 1300 97.9 F (36.6 C)     Temp Source 07/13/15 1300 Oral     SpO2 07/13/15 1300 96 %     Weight 07/13/15 1300 160 lb (72.576 kg)     Height 07/13/15 1300 5\' 5"  (1.651 m)     Head Cir --      Peak Flow --      Pain Score 07/13/15 1301 8     Pain Loc --      Pain Edu? --      Excl. in [place]? --     Constitutional: Alert and oriented. Well appearing and in no acute distress. Appears younger than chronological age. Eyes: Conjunctivae are Slightly injected but the patient just had cataract surgery yesterday. PERRL. EOMI. Head: Atraumatic.  No tenderness to palpation of the temples. Nose: No congestion/rhinnorhea. Mouth/Throat: Mucous membranes are moist.  Oropharynx non-erythematous. Neck: No stridor.  No meningeal signs.   Cardiovascular: Normal rate, regular rhythm. Good peripheral circulation. Grossly normal heart sounds.   Respiratory: Normal respiratory effort.  No retractions. Lungs CTAB. Gastrointestinal: Soft and nontender. No distention.  Musculoskeletal: No lower extremity tenderness nor edema. No gross deformities of extremities. Neurologic:  Normal speech and language. No gross focal neurologic deficits are appreciated.  Skin:  Skin is warm, dry and intact. No rash noted. Psychiatric: Mood and affect are normal. Speech and behavior are normal.  ____________________________________________   LABS (all labs ordered are listed, but only abnormal results  are displayed)  Labs Reviewed - No data to display ____________________________________________  EKG  None ____________________________________________  RADIOLOGY   Ct Head Wo Contrast  07/13/2015  CLINICAL DATA:  Acute headache without known injury. EXAM: CT HEAD WITHOUT CONTRAST TECHNIQUE: Contiguous axial images were obtained from the base of the skull through the vertex without intravenous contrast. COMPARISON:  CT scan of May 27, 2011. FINDINGS: Bony calvarium appears intact. Mild diffuse cortical atrophy is noted. Mild chronic ischemic white matter disease is noted. No mass effect or midline shift is noted. Ventricular size  is within normal limits. There is no evidence of mass lesion, hemorrhage or acute infarction. IMPRESSION: Mild diffuse cortical atrophy. Mild chronic ischemic white matter disease. No acute intracranial abnormality seen. Electronically Signed   By: Marijo Conception, M.D.   On: 07/13/2015 14:06    ____________________________________________   PROCEDURES  Procedure(s) performed: None  Critical Care performed: No ____________________________________________   INITIAL IMPRESSION / ASSESSMENT AND PLAN / ED COURSE  Pertinent labs & imaging results that were available during my care of the patient were reviewed by me and considered in my medical decision making (see chart for details).  The patient is well-appearing and in no acute distress and states she does not have a headache at this time.  She also states she does not want to be here and her daughter made her come.  CT is unremarkable with no acute findings.  I discussed various treatment options such as Tylenol together with her ibuprofen.  We also discussed treatment here, but she does not want anything and just wants to go home.  Her daughter is comfortable with this plan.  I gave my usual and customary return precautions.     ____________________________________________  FINAL CLINICAL  IMPRESSION(S) / ED DIAGNOSES  Final diagnoses:  Nonintractable episodic headache, unspecified headache type      NEW MEDICATIONS STARTED DURING THIS VISIT:  New Prescriptions   No medications on file      Note:  This document was prepared using Dragon voice recognition software and may include unintentional dictation errors.   Hinda Kehr, MD 07/13/15 1415  Hinda Kehr, MD 07/13/15 1420

## 2015-07-13 NOTE — Discharge Instructions (Signed)
You have been seen in the Emergency Department (ED) for a headache.  Your CT scan was reassuring and unremarkable.  Please use Tylenol or Motrin as needed for symptoms, but only as written on the box.  As we have discussed, please follow up with your primary care doctor as soon as possible regarding todays Emergency Department (ED) visit and your headache symptoms.    Call your doctor or return to the ED if you have a worsening headache, sudden and severe headache, confusion, slurred speech, facial droop, weakness or numbness in any arm or leg, extreme fatigue, vision problems, or other symptoms that concern you.   General Headache Without Cause A headache is pain or discomfort felt around the head or neck area. The specific cause of a headache may not be found. There are many causes and types of headaches. A few common ones are:  Tension headaches.  Migraine headaches.  Cluster headaches.  Chronic daily headaches. HOME CARE INSTRUCTIONS  Watch your condition for any changes. Take these steps to help with your condition: Managing Pain  Take over-the-counter and prescription medicines only as told by your health care provider.  Lie down in a dark, quiet room when you have a headache.  If directed, apply ice to the head and neck area:  Put ice in a plastic bag.  Place a towel between your skin and the bag.  Leave the ice on for 20 minutes, 2-3 times per day.  Use a heating pad or hot shower to apply heat to the head and neck area as told by your health care provider.  Keep lights dim if bright lights bother you or make your headaches worse. Eating and Drinking  Eat meals on a regular schedule.  Limit alcohol use.  Decrease the amount of caffeine you drink, or stop drinking caffeine. General Instructions  Keep all follow-up visits as told by your health care provider. This is important.  Keep a headache journal to help find out what may trigger your headaches. For example,  write down:  What you eat and drink.  How much sleep you get.  Any change to your diet or medicines.  Try massage or other relaxation techniques.  Limit stress.  Sit up straight, and do not tense your muscles.  Do not use tobacco products, including cigarettes, chewing tobacco, or e-cigarettes. If you need help quitting, ask your health care provider.  Exercise regularly as told by your health care provider.  Sleep on a regular schedule. Get 7-9 hours of sleep, or the amount recommended by your health care provider. SEEK MEDICAL CARE IF:   Your symptoms are not helped by medicine.  You have a headache that is different from the usual headache.  You have nausea or you vomit.  You have a fever. SEEK IMMEDIATE MEDICAL CARE IF:   Your headache becomes severe.  You have repeated vomiting.  You have a stiff neck.  You have a loss of vision.  You have problems with speech.  You have pain in the eye or ear.  You have muscular weakness or loss of muscle control.  You lose your balance or have trouble walking.  You feel faint or pass out.  You have confusion.   This information is not intended to replace advice given to you by your health care provider. Make sure you discuss any questions you have with your health care provider.   Document Released: 03/20/2005 Document Revised: 12/09/2014 Document Reviewed: 07/13/2014 Elsevier Interactive Patient Education 2016  [employer] ° °

## 2015-07-13 NOTE — ED Notes (Signed)
Pt from [facility] clinic with headache x 3-4 days. Denies injury or trauma. She states headache is intermittent pain to back of head.

## 2015-08-02 ENCOUNTER — Emergency Department: Payer: Medicare Other

## 2015-08-02 ENCOUNTER — Emergency Department
Admission: EM | Admit: 2015-08-02 | Discharge: 2015-08-02 | Disposition: A | Discharge status: Home / Self Care | Payer: Medicare Other | Attending: Emergency Medicine | Admitting: Emergency Medicine

## 2015-08-02 DIAGNOSIS — I10 Essential (primary) hypertension: Secondary | ICD-10-CM | POA: Diagnosis not present

## 2015-08-02 DIAGNOSIS — F329 Major depressive disorder, single episode, unspecified: Secondary | ICD-10-CM | POA: Insufficient documentation

## 2015-08-02 DIAGNOSIS — R51 Headache: Secondary | ICD-10-CM | POA: Insufficient documentation

## 2015-08-02 DIAGNOSIS — Z853 Personal history of malignant neoplasm of breast: Secondary | ICD-10-CM | POA: Diagnosis not present

## 2015-08-02 DIAGNOSIS — Z79899 Other long term (current) drug therapy: Secondary | ICD-10-CM | POA: Diagnosis not present

## 2015-08-02 DIAGNOSIS — Z7982 Long term (current) use of aspirin: Secondary | ICD-10-CM | POA: Diagnosis not present

## 2015-08-02 DIAGNOSIS — E039 Hypothyroidism, unspecified: Secondary | ICD-10-CM | POA: Insufficient documentation

## 2015-08-02 DIAGNOSIS — M542 Cervicalgia: Secondary | ICD-10-CM | POA: Insufficient documentation

## 2015-08-02 DIAGNOSIS — R519 Headache, unspecified: Secondary | ICD-10-CM

## 2015-08-02 LAB — CBC WITH DIFFERENTIAL/PLATELET
BASOS PCT: 0 %
Basophils Absolute: 0 10*3/uL (ref 0–0.1)
EOS ABS: 0.2 10*3/uL (ref 0–0.7)
Eosinophils Relative: 2 %
HEMATOCRIT: 42.5 % (ref 35.0–47.0)
Hemoglobin: 14.6 g/dL (ref 12.0–16.0)
LYMPHS PCT: 16 %
Lymphs Abs: 1.8 10*3/uL (ref 1.0–3.6)
MCH: 31.8 pg (ref 26.0–34.0)
MCHC: 34.3 g/dL (ref 32.0–36.0)
MCV: 92.5 fL (ref 80.0–100.0)
Monocytes Absolute: 0.6 10*3/uL (ref 0.2–0.9)
Monocytes Relative: 5 %
NEUTROS ABS: 8.4 10*3/uL — AB (ref 1.4–6.5)
Neutrophils Relative %: 77 %
Platelets: 210 10*3/uL (ref 150–440)
RBC: 4.6 MIL/uL (ref 3.80–5.20)
RDW: 13.1 % (ref 11.5–14.5)
WBC: 11 10*3/uL (ref 3.6–11.0)

## 2015-08-02 LAB — BASIC METABOLIC PANEL
Anion gap: 9 (ref 5–15)
BUN: 16 mg/dL (ref 6–20)
CALCIUM: 9.3 mg/dL (ref 8.9–10.3)
CO2: 25 mmol/L (ref 22–32)
CREATININE: 0.78 mg/dL (ref 0.44–1.00)
Chloride: 103 mmol/L (ref 101–111)
GFR calc Af Amer: >60 mL/min (ref >60)
GFR calc non Af Amer: >60 mL/min (ref >60)
GLUCOSE: 107 mg/dL — AB (ref 65–99)
Potassium: 3.7 mmol/L (ref 3.5–5.1)
Sodium: 137 mmol/L (ref 135–145)

## 2015-08-02 LAB — SEDIMENTATION RATE: Sed Rate: 18 mm/hr (ref 0–30)

## 2015-08-02 MED ORDER — GADOBENATE DIMEGLUMINE 529 MG/ML IV SOLN
20.0000 mL | Freq: Once | INTRAVENOUS | Status: AC | PRN
  Administered 2015-08-02: 16 mL via INTRAVENOUS

## 2015-08-02 MED ORDER — DIPHENHYDRAMINE HCL 50 MG/ML IJ SOLN
12.5000 mg | Freq: Once | INTRAMUSCULAR | Status: AC
  Administered 2015-08-02: 12.5 mg via INTRAVENOUS
  Filled 2015-08-02: qty 1

## 2015-08-02 MED ORDER — OXYCODONE HCL 5 MG PO TABS
2.5000 mg | ORAL_TABLET | Freq: Four times a day (QID) | ORAL | Status: DC | PRN
Start: 2015-08-02 — End: 2016-07-02

## 2015-08-02 MED ORDER — PROCHLORPERAZINE MALEATE 5 MG PO TABS: 5.0000 mg | ORAL_TABLET | Freq: Four times a day (QID) | ORAL | Status: DC | PRN

## 2015-08-02 MED ORDER — BUTALBITAL-APAP-CAFFEINE 50-325-40 MG PO TABS
1.0000 | ORAL_TABLET | Freq: Once | ORAL | Status: AC
  Administered 2015-08-02: 1 via ORAL

## 2015-08-02 MED ORDER — BUTALBITAL-APAP-CAFFEINE 50-325-40 MG PO TABS
ORAL_TABLET | ORAL | Status: AC
  Administered 2015-08-02: 1 via ORAL
  Filled 2015-08-02: qty 1

## 2015-08-02 NOTE — Discharge Instructions (Signed)

## 2015-08-02 NOTE — ED Provider Notes (Signed)
[facility] Emergency Department Provider Note  ____________________________________________  Time seen: 7:05 AM  I have reviewed the triage vital signs and the nursing notes.   HISTORY  Chief Complaint Headache    HPI Destiny Neal is a 83 y.o. female who complains of right neck and right occipital headache for the past 3 weeks. The headache started in between having to cataract procedures on her 2 eyes. She does not think that it's related. She was seen in the emergency department about 3 weeks ago at which time the symptoms were fairly mild and she was taking Tylenol and 200 mg ibuprofen for it. However, despite workup at that time and follow up with primary care and additional lab tests, her symptoms have continued and seemed to be worsening. She denies any trauma or vision changes. No numbness tingling or weakness. No vomiting. No syncope.  Pain is intermittent, lasting 30-60 minutes at a time. Sharp and aching. No aggravating or alleviating factors. Nonradiating.   Past Medical History  Diagnosis Date  . Migraines     none in 8-10 yrs  . Hypothyroidism   . Arthritis     hips  . Hypertension   . Depression   . Wears hearing aid     bilateral  . Breast cancer ([place])      There are no active problems to display for this patient.    Past Surgical History  Procedure Laterality Date  . Breast lumpectomy    . Joint replacement Left     total hip  . Cataract extraction w/phaco Left 06/21/2015    Procedure: CATARACT EXTRACTION PHACO AND INTRAOCULAR LENS PLACEMENT (IOC);  Surgeon: Ronnell Freshwater, MD;  Location: [facility];  Service: Ophthalmology;  Laterality: Left;  . Cataract extraction w/phaco Right 07/12/2015    Procedure: CATARACT EXTRACTION PHACO AND INTRAOCULAR LENS PLACEMENT ([place]) right;  Surgeon: Ronnell Freshwater, MD;  Location: [facility];  Service: Ophthalmology;  Laterality: Right;     Current  Outpatient Rx  Name  Route  Sig  Dispense  Refill  . amLODipine (NORVASC) 2.5 MG tablet   Oral   Take 2.5 mg by mouth daily.         Marland Kitchen aspirin 81 MG tablet   Oral   Take 81 mg by mouth daily.         . Calcium Citrate-Vitamin D (CALCIUM + D PO)   Oral   Take 1 tablet by mouth 2 (two) times daily.          . dorzolamide (TRUSOPT) 2 % ophthalmic solution   Right Eye   Place 1 drop into the right eye 2 (two) times daily.         Marland Kitchen latanoprost (XALATAN) 0.005 % ophthalmic solution   Both Eyes   Place 1 drop into both eyes at bedtime.         Marland Kitchen levothyroxine (SYNTHROID, LEVOTHROID) 25 MCG tablet   Oral   Take 25 mcg by mouth daily before breakfast.         . Multiple Vitamin (MULTIVITAMIN) capsule   Oral   Take 1 capsule by mouth daily.         . Nutritional Supplements (GRAPESEED EXTRACT PO)   Oral   Take 100 mg by mouth daily.          Marland Kitchen PARoxetine (PAXIL) 10 MG tablet   Oral   Take 5 mg by mouth daily.         Marland Kitchen  vitamin B-12 (CYANOCOBALAMIN) 1000 MCG tablet   Oral   Take 2,000 mcg by mouth daily.         Marland Kitchen oxyCODONE (ROXICODONE) 5 MG immediate release tablet   Oral   Take 0.5 tablets (2.5 mg total) by mouth every 6 (six) hours as needed for breakthrough pain (may increase to 67m if initial dose is ineffective.).   15 tablet   0   . prochlorperazine (COMPAZINE) 5 MG tablet   Oral   Take 1 tablet (5 mg total) by mouth every 6 (six) hours as needed for nausea or vomiting.   30 tablet   0      Allergies Lincocin; Codeine; Hydrocodone; Metronidazole; Penicillins; Tramadol; Betadine; and Iodine   No family history on file.  Social History Social History  Substance Use Topics  . Smoking status: Never Smoker   . Smokeless tobacco: Not on file  . Alcohol Use: No    Review of Systems  Constitutional:   No fever or chills.  Eyes:   No vision changes.  ENT:   No sore throat. No rhinorrhea. Cardiovascular:   No chest pain. Respiratory:    No dyspnea or cough. Gastrointestinal:   Negative for abdominal pain, vomiting and diarrhea.  Genitourinary:   Negative for dysuria or difficulty urinating. Musculoskeletal:   Negative for focal pain or swelling Neurological:   Positive as above for headache 10-point ROS otherwise negative.  ____________________________________________   PHYSICAL EXAM:  VITAL SIGNS: ED Triage Vitals  Enc Vitals Group     BP 08/02/15 0420 164/85 mmHg     Pulse Rate 08/02/15 0420 74     Resp 08/02/15 0420 22     Temp 08/02/15 0420 97.8 F (36.6 C)     Temp Source 08/02/15 0420 Oral     SpO2 08/02/15 0420 97 %     Weight 08/02/15 0417 170 lb (77.111 kg)     Height 08/02/15 0417 '5\' 5"'  (1.651 m)     Head Cir --      Peak Flow --      Pain Score 08/02/15 0417 10     Pain Loc --      Pain Edu? --      Excl. in [place] --     Vital signs reviewed, nursing assessments reviewed.   Constitutional:   Alert and oriented. Uncomfortable appearing. Eyes:   No scleral icterus. No conjunctival pallor. PERRL. EOMI.  No nystagmus. ENT   Head:   Normocephalic and atraumatic.   Nose:   No congestion/rhinnorhea. No septal hematoma   Mouth/Throat:   MMM, no pharyngeal erythema. No peritonsillar mass.    Neck:   No stridor. No SubQ emphysema. No meningismus. There is spasm and tenseness and tenderness of the right paraspinous cervical muscles. Palpation does reproduce her symptoms. Hematological/Lymphatic/Immunilogical:   No cervical lymphadenopathy. Cardiovascular:   RRR. Symmetric bilateral radial and DP pulses.  No murmurs.  Respiratory:   Normal respiratory effort without tachypnea nor retractions. Breath sounds are clear and equal bilaterally. No wheezes/rales/rhonchi. Gastrointestinal:   Soft and nontender. Non distended. There is no CVA tenderness.  No rebound, rigidity, or guarding. Genitourinary:   deferred Musculoskeletal:   Nontender with normal range of motion in all extremities. No joint  effusions.  No lower extremity tenderness.  No edema. Neurologic:   Normal speech and language.  CN 2-10 normal. Motor grossly intact. No gross focal neurologic deficits are appreciated.  Skin:    Skin is warm, dry and intact.  No rash noted.  No petechiae, purpura, or bullae.  ____________________________________________    LABS (pertinent positives/negatives) (all labs ordered are listed, but only abnormal results are displayed) Labs Reviewed  CBC WITH DIFFERENTIAL/PLATELET - Abnormal; Notable for the following:    Neutro Abs 8.4 (*)    All other components within normal limits  BASIC METABOLIC PANEL - Abnormal; Notable for the following:    Glucose, Bld 107 (*)    All other components within normal limits  SEDIMENTATION RATE   ____________________________________________   EKG    ____________________________________________    RADIOLOGY  MRI brain and MRA of the head and neck shows some stenosis and chronic microvascular disease but no acute findings. There is some moderate to severe stenosis in the area of the posterior communicating artery and MCA which does not correspond to her symptoms.  There is a questionable 2 or 3 mm left posterior communicating artery aneurysm.  ____________________________________________   PROCEDURES   ____________________________________________   INITIAL IMPRESSION / ASSESSMENT AND PLAN / ED COURSE  Pertinent labs & imaging results that were available during my care of the patient were reviewed by me and considered in my medical decision making (see chart for details).  Patient presents with subacute to chronic neck and neck pain and occipital headache. Considering the patient's symptoms, medical history, and physical examination today, I have low suspicion for ischemic stroke, intracranial hemorrhage, meningitis, encephalitis, carotid or vertebral dissection, venous sinus thrombosis, MS, intracranial hypertension, glaucoma, CRAO,  CRVO, or temporal arteritis. Despite the finding of possible aneurysm on MRI, I think that this 2 mm finding is not clinically significant at this time and not causing her symptoms. No evidence of subarachnoid hemorrhage. Lumbar puncture does not appear to be warranted at this time. Exam is highly consistent with cervical muscle spasm as the cause of her pain. I'll treat her with Compazine and oxycodone and have her follow-up with neurology and primary care. Labs are otherwise unremarkable including ESR.     ____________________________________________   FINAL CLINICAL IMPRESSION(S) / ED DIAGNOSES  Final diagnoses:  Neck pain, acute  Recurrent occipital headache       Portions of this note were generated with dragon dictation software. Dictation errors may occur despite best attempts at proofreading.   Carrie Mew, MD 08/02/15 1147

## 2015-08-02 NOTE — ED Notes (Addendum)
Patient reports pain to back of head, left side since approximately April 6th.  Patient denies any type of injury.  States pain was come and go at first but no it is constant and severe.  Per daughter she was seen approximately 3 weeks ago for same and had CT which was normal and followed up with her MD.  States pain had resolved but returned a few days ago.

## 2016-05-10 IMAGING — MR MR MRA HEAD W/O CM
10 of 11 series · 33 of 48 positions shown · IV contrast (multihance)
Comparison: None.

CLINICAL DATA: Posterior headaches beginning 4 weeks ago. Pain has
become more progressive vein consistent over time.

EXAM:
MRI HEAD WITHOUT CONTRAST
MRA HEAD WITHOUT CONTRAST
MRA NECK WITHOUT AND WITH CONTRAST
TECHNIQUE: Multiplanar, multiecho pulse sequences of the brain and surrounding
structures were obtained without intravenous contrast. Angiographic
images of the Circle of Willis were obtained using MRA technique
without intravenous contrast. Angiographic images of the neck were
obtained using MRA technique without and with intravenous contrast.
Carotid stenosis measurements (when applicable) are obtained
utilizing NASCET criteria, using the distal internal carotid
diameter as the denominator.
CONTRAST:  16mL MULTIHANCE GADOBENATE DIMEGLUMINE 529 MG/ML IV SOLN

[Series 2: T1 · sagittal · 5.0mm · 0.45mm/px · 2 of 27 slices shown (1 of 2)]
[im 1/27]
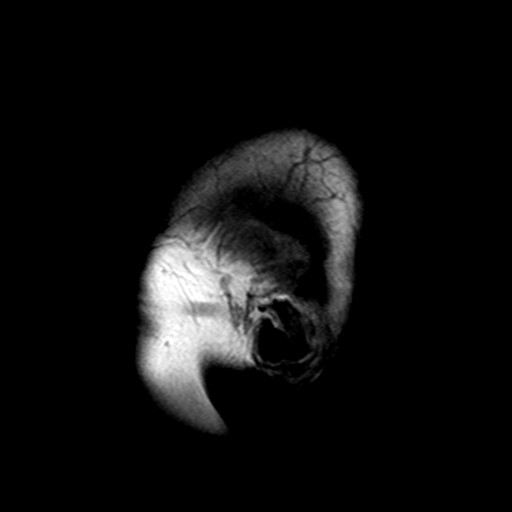
[im 27/27]
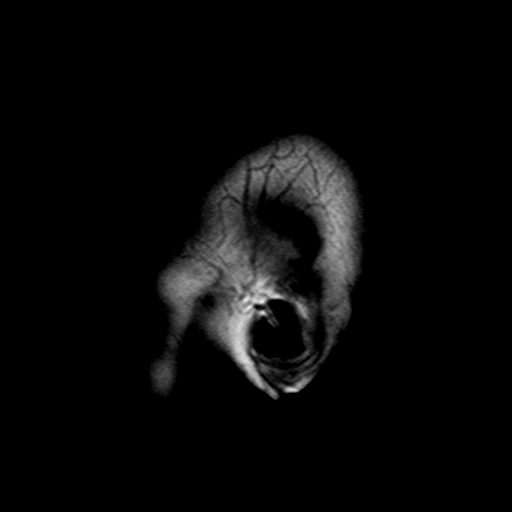

[Series 4: DWI · axial · 4.0mm · 0.94mm/px · z∈[-77,+93]mm · 4 of 43 slices shown (1 of 4)]
[im 1/43]
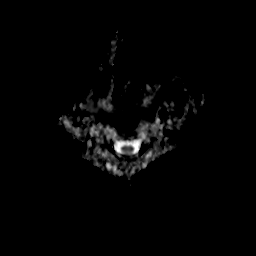
[im 15/43]
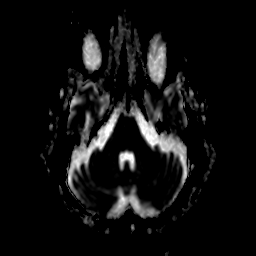
[im 29/43]
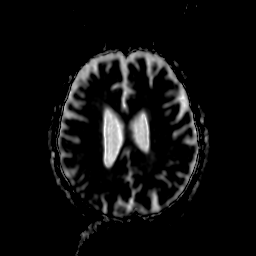
[im 43/43]
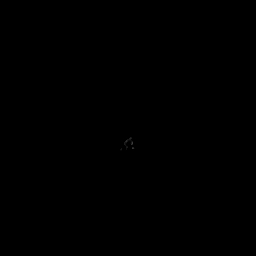

[Series 5: DWI · axial · 4.0mm · 0.94mm/px · z∈[-69,+89]mm · 4 of 41 slices shown (2 of 4)]
[im 1/41]
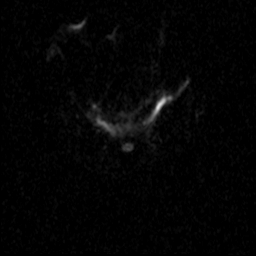
[im 14/41]
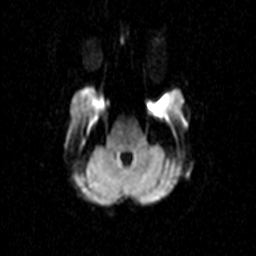
[im 27/41]
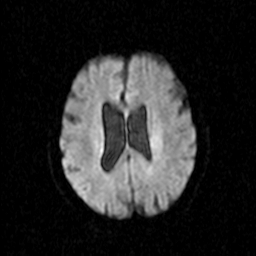
[im 41/41]
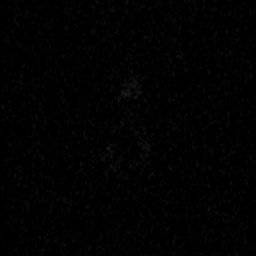

[Series 7: DWI · coronal · 5.0mm · 1.80mm/px · 4 of 37 slices shown (3 of 4)]
[im 1/37]
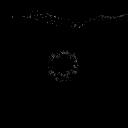
[im 13/37]
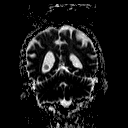
[im 25/37]
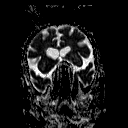
[im 37/37]
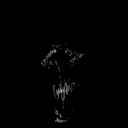

[Series 8: T2 · axial · 5.0mm · 0.45mm/px · z∈[-69,+92]mm · 3 of 26 slices shown (1 of 3)]
[im 1/26]
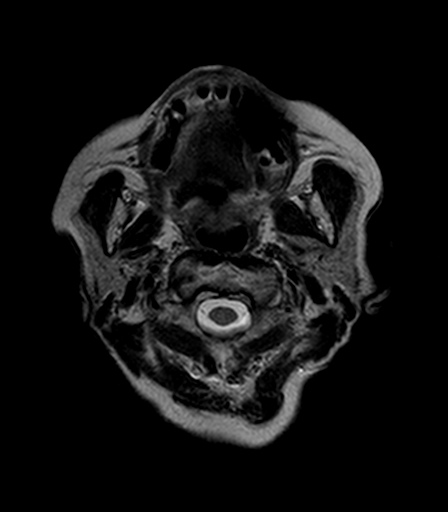
[im 13/26]
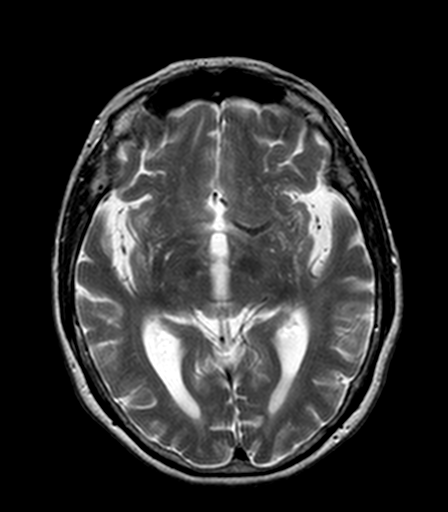
[im 26/26]
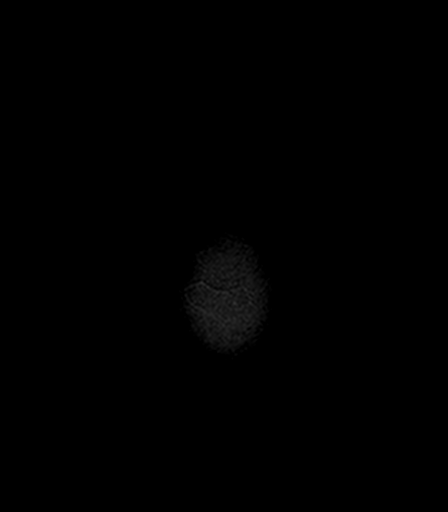

[Series 9: FLAIR · axial · 5.0mm · 0.90mm/px · z∈[-68,+93]mm · 3 of 26 slices shown]
[im 1/26]
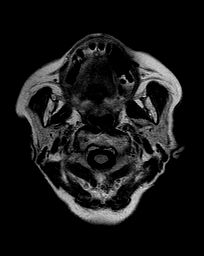
[im 13/26]
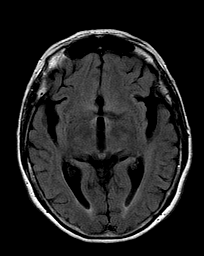
[im 26/26]
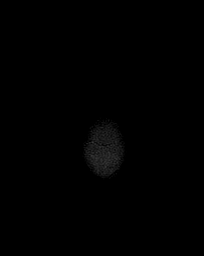

[Series 10: DWI · coronal · 5.0mm · 1.80mm/px · 4 of 36 slices shown (4 of 4)]
[im 1/36]
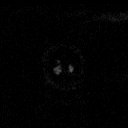
[im 12/36]
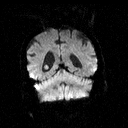
[im 24/36]
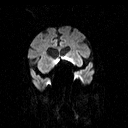
[im 36/36]
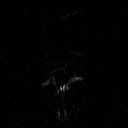

[Series 17: T2 · axial · 5.0mm · 0.45mm/px · z∈[-69,+92]mm · 3 of 26 slices shown (2 of 3)]
[im 1/26]
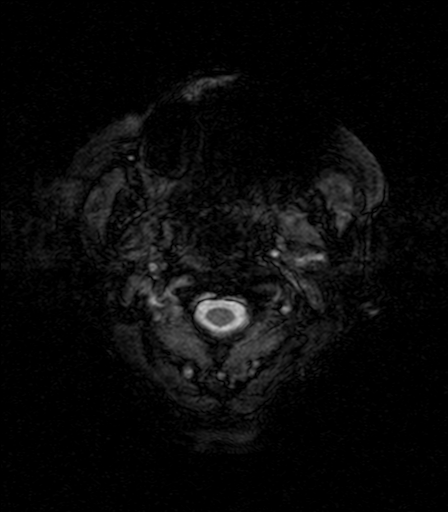
[im 13/26]
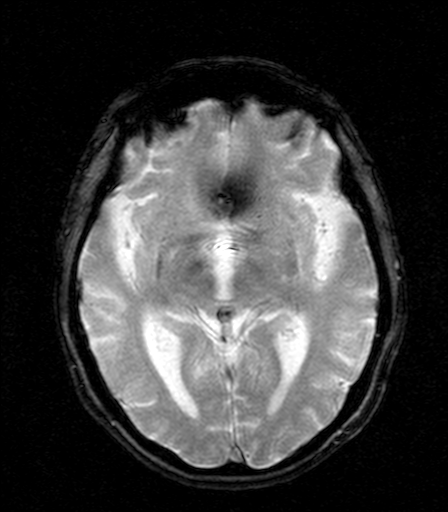
[im 26/26]
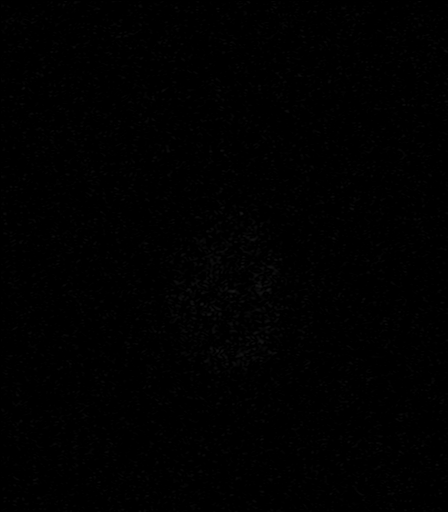

[Series 19: T1 · axial · 3.0mm · 0.45mm/px · z∈[-80,-6]mm · 3 of 64 slices shown (2 of 2)]
[im 1/64]
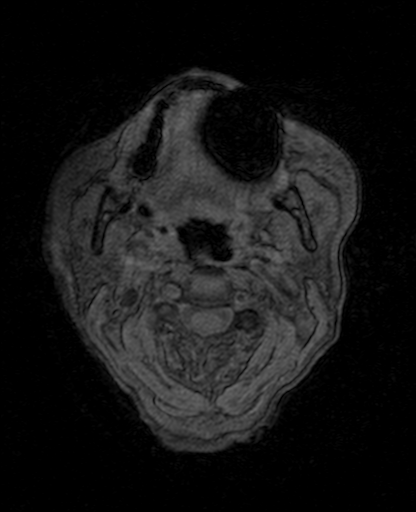
[im 13/64]
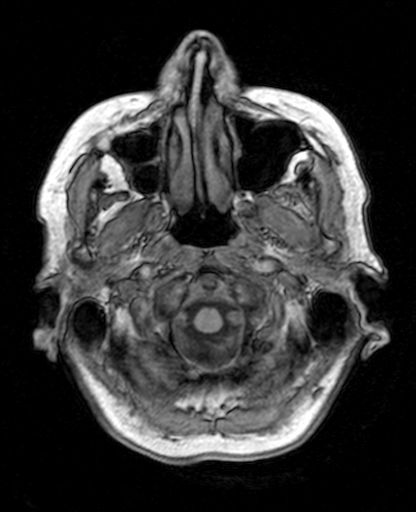
[im 26/64]
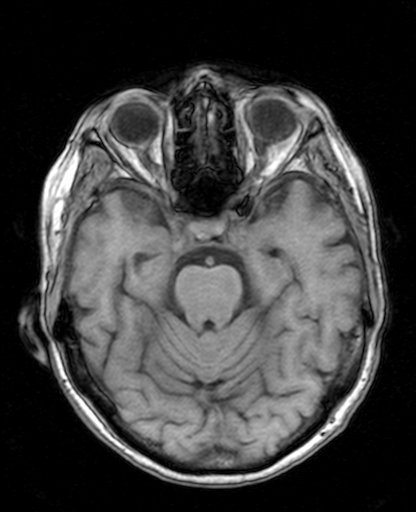

[Series 21: T2 · coronal · 5.0mm · 0.45mm/px · 3 of 28 slices shown (3 of 3)]
[im 1/28]
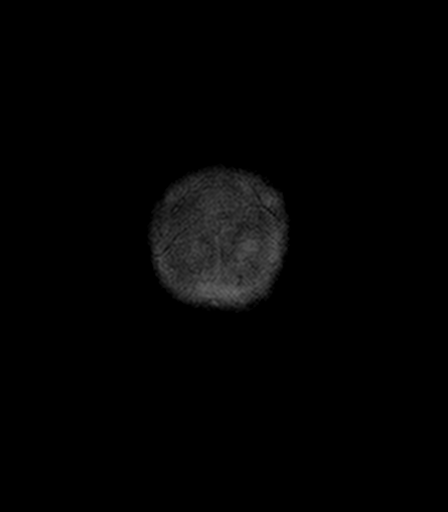
[im 14/28]
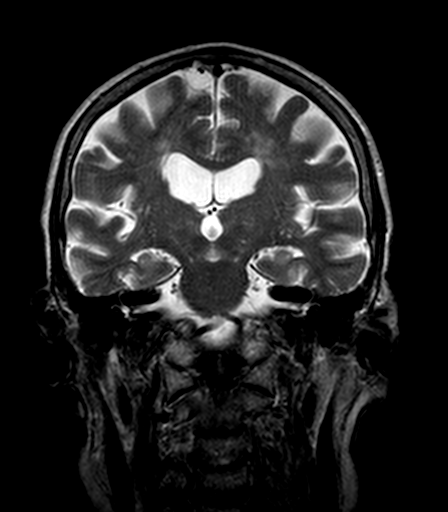
[im 28/28]
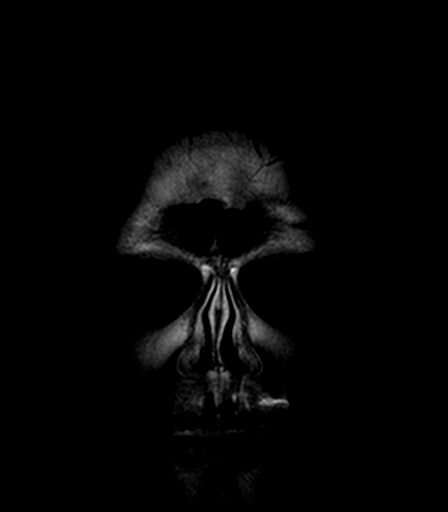

[33 of 48 positions shown; findings below may reference images not displayed]

FINDINGS: MRI HEAD FINDINGS

Mild atrophy and white matter changes are present bilaterally. No
acute infarct, hemorrhage, or mass lesion is present. The ventricles
are proportionate to the degree of atrophy. No significant
extra-axial fluid collection is present.

The brainstem and cerebellum are within normal limits. The internal
auditory canals are normal bilaterally.

Flow is present in the major intracranial arteries. Bilateral lens
replacements are evident bilaterally. Globes and orbits are
otherwise intact.

Bilateral lens replacements are present. The globes and orbits are
intact. The paranasal sinuses are clear. The skullbase is within
normal limits. Midline sagittal images demonstrate mild degenerative
changes in the upper cervical spine. No focal lesions are present.

MRA HEAD FINDINGS

Mild atherosclerotic changes are present in the cavernous internal
carotid arteries bilaterally. There is no significant stenosis
relative to the more distal vessel. A 2-3 mm left posterior
communicating artery aneurysm or infundibulum is noted. There is no
significant posterior communicating artery on the left. The left A1
segment is dominant. A moderate to severe distal right M1 segment
stenosis is present. There is moderate to severe proximal M2 segment
stenoses on the left. This results in significant attenuation of MCA
branch vessels bilaterally, left greater than right.

The left PICA origin is visualized and normal. The right vertebral
artery is the dominant vessel. There is a moderate stenosis in the
distal right vertebral artery. The basilar artery demonstrates mild
atherosclerotic changes proximally without a significant stenosis.
There moderate to high-grade stenoses in the proximal posterior
cerebral arteries bilaterally.

MRA NECK FINDINGS

The time-of-flight images demonstrate no significant flow
disturbance at either carotid bifurcation. Flow is antegrade in the
vertebral arteries bilaterally.

The right common carotid artery is within normal limits. Mild
atherosclerotic changes are present at the bifurcation without
significant stenosis. There is moderate to severe tortuosity of the
cervical right ICA proximally without a significant stenosis.

The left common carotid artery demonstrates mild tortuosity and
atherosclerotic change without significant stenosis. The bifurcation
is unremarkable. Mild tortuosity of the cervical left ICA is present
without significant stenosis.

Both vertebral arteries originate from the subclavian arteries.
Moderate tortuosity is present at the proximal left vertebral
artery. The vertebral arteries are codominant in the neck. There is
moderate stenosis of the left vertebral artery at the dural margin
and more distal lead, just proximal to the vertebrobasilar segment.
IMPRESSION: 1. Mild atrophy and periventricular white matter changes likely
reflect the sequela of chronic microvascular ischemia.
2. No acute intracranial abnormality or focal lesion to explain
headaches otherwise.
3. Moderate distal right M1 and proximal left M2 segment stenoses
with significant attenuation of MCA branch vessels bilaterally.
4. Two 3 mm left posterior communicating artery aneurysm.
Infundibulum is considered less likely without a significantly size
posterior communicating artery on the left.
5. Moderate stenosis of the proximal P2 segments bilaterally.
6. Marked tortuosity of the cervical internal carotid arteries
bilaterally, right greater than left.
7. Mild atherosclerotic changes at both carotid bifurcations without
significant stenosis.
8. Mild to moderate narrowing of the left vertebral artery at the
dural margin and again just proximal to the vertebrobasilar
junction.

## 2016-07-02 ENCOUNTER — Inpatient Hospital Stay
Admission: EM | Admit: 2016-07-02 | Discharge: 2016-07-06 | DRG: 066 | Disposition: A | Discharge status: Skilled Nursing Facility | Payer: Medicare Other | Attending: Internal Medicine | Admitting: Internal Medicine

## 2016-07-02 ENCOUNTER — Emergency Department: Payer: Medicare Other

## 2016-07-02 ENCOUNTER — Encounter: Payer: Self-pay | Admitting: Emergency Medicine

## 2016-07-02 ENCOUNTER — Observation Stay: Payer: Medicare Other

## 2016-07-02 DIAGNOSIS — I671 Cerebral aneurysm, nonruptured: Secondary | ICD-10-CM | POA: Diagnosis present

## 2016-07-02 DIAGNOSIS — Z885 Allergy status to narcotic agent status: Secondary | ICD-10-CM

## 2016-07-02 DIAGNOSIS — I639 Cerebral infarction, unspecified: Secondary | ICD-10-CM | POA: Diagnosis not present

## 2016-07-02 DIAGNOSIS — Z823 Family history of stroke: Secondary | ICD-10-CM

## 2016-07-02 DIAGNOSIS — R42 Dizziness and giddiness: Secondary | ICD-10-CM

## 2016-07-02 DIAGNOSIS — Z888 Allergy status to other drugs, medicaments and biological substances status: Secondary | ICD-10-CM

## 2016-07-02 DIAGNOSIS — R29700 NIHSS score 0: Secondary | ICD-10-CM | POA: Diagnosis present

## 2016-07-02 DIAGNOSIS — Z974 Presence of external hearing-aid: Secondary | ICD-10-CM

## 2016-07-02 DIAGNOSIS — Z833 Family history of diabetes mellitus: Secondary | ICD-10-CM

## 2016-07-02 DIAGNOSIS — Z8249 Family history of ischemic heart disease and other diseases of the circulatory system: Secondary | ICD-10-CM

## 2016-07-02 DIAGNOSIS — Z9841 Cataract extraction status, right eye: Secondary | ICD-10-CM

## 2016-07-02 DIAGNOSIS — R262 Difficulty in walking, not elsewhere classified: Secondary | ICD-10-CM | POA: Diagnosis present

## 2016-07-02 DIAGNOSIS — G459 Transient cerebral ischemic attack, unspecified: Secondary | ICD-10-CM | POA: Diagnosis present

## 2016-07-02 DIAGNOSIS — R680 Hypothermia, not associated with low environmental temperature: Secondary | ICD-10-CM | POA: Diagnosis present

## 2016-07-02 DIAGNOSIS — Z79899 Other long term (current) drug therapy: Secondary | ICD-10-CM

## 2016-07-02 DIAGNOSIS — H409 Unspecified glaucoma: Secondary | ICD-10-CM | POA: Diagnosis present

## 2016-07-02 DIAGNOSIS — E039 Hypothyroidism, unspecified: Secondary | ICD-10-CM | POA: Diagnosis present

## 2016-07-02 DIAGNOSIS — F329 Major depressive disorder, single episode, unspecified: Secondary | ICD-10-CM | POA: Diagnosis present

## 2016-07-02 DIAGNOSIS — Z91048 Other nonmedicinal substance allergy status: Secondary | ICD-10-CM

## 2016-07-02 DIAGNOSIS — M1611 Unilateral primary osteoarthritis, right hip: Secondary | ICD-10-CM | POA: Diagnosis present

## 2016-07-02 DIAGNOSIS — Z961 Presence of intraocular lens: Secondary | ICD-10-CM | POA: Diagnosis present

## 2016-07-02 DIAGNOSIS — I10 Essential (primary) hypertension: Secondary | ICD-10-CM | POA: Diagnosis present

## 2016-07-02 DIAGNOSIS — Z79891 Long term (current) use of opiate analgesic: Secondary | ICD-10-CM

## 2016-07-02 DIAGNOSIS — E785 Hyperlipidemia, unspecified: Secondary | ICD-10-CM | POA: Diagnosis present

## 2016-07-02 DIAGNOSIS — Z853 Personal history of malignant neoplasm of breast: Secondary | ICD-10-CM

## 2016-07-02 DIAGNOSIS — Z881 Allergy status to other antibiotic agents status: Secondary | ICD-10-CM

## 2016-07-02 DIAGNOSIS — Z9842 Cataract extraction status, left eye: Secondary | ICD-10-CM

## 2016-07-02 DIAGNOSIS — Z96642 Presence of left artificial hip joint: Secondary | ICD-10-CM | POA: Diagnosis present

## 2016-07-02 DIAGNOSIS — Z7982 Long term (current) use of aspirin: Secondary | ICD-10-CM

## 2016-07-02 DIAGNOSIS — B029 Zoster without complications: Secondary | ICD-10-CM | POA: Diagnosis present

## 2016-07-02 DIAGNOSIS — F419 Anxiety disorder, unspecified: Secondary | ICD-10-CM | POA: Diagnosis present

## 2016-07-02 DIAGNOSIS — Z880 Allergy status to penicillin: Secondary | ICD-10-CM

## 2016-07-02 LAB — CBC
HEMATOCRIT: 45.7 % (ref 35.0–47.0)
HEMOGLOBIN: 15.3 g/dL (ref 12.0–16.0)
MCH: 31.6 pg (ref 26.0–34.0)
MCHC: 33.6 g/dL (ref 32.0–36.0)
MCV: 94.1 fL (ref 80.0–100.0)
Platelets: 195 10*3/uL (ref 150–440)
RBC: 4.85 MIL/uL (ref 3.80–5.20)
RDW: 13 % (ref 11.5–14.5)
WBC: 12.1 10*3/uL — ABNORMAL HIGH (ref 3.6–11.0)

## 2016-07-02 LAB — COMPREHENSIVE METABOLIC PANEL
ALK PHOS: 64 U/L (ref 38–126)
ALT: 17 U/L (ref 14–54)
ANION GAP: 8 (ref 5–15)
AST: 26 U/L (ref 15–41)
Albumin: 4.1 g/dL (ref 3.5–5.0)
BILIRUBIN TOTAL: 0.6 mg/dL (ref 0.3–1.2)
BUN: 23 mg/dL — ABNORMAL HIGH (ref 6–20)
CALCIUM: 9.4 mg/dL (ref 8.9–10.3)
CO2: 26 mmol/L (ref 22–32)
Chloride: 103 mmol/L (ref 101–111)
Creatinine, Ser: 0.79 mg/dL (ref 0.44–1.00)
GFR calc non Af Amer: >60 mL/min (ref >60)
Glucose, Bld: 139 mg/dL — ABNORMAL HIGH (ref 65–99)
Potassium: 3.5 mmol/L (ref 3.5–5.1)
Sodium: 137 mmol/L (ref 135–145)
TOTAL PROTEIN: 7.3 g/dL (ref 6.5–8.1)

## 2016-07-02 LAB — TROPONIN I: Troponin I: <0.03 ng/mL (ref <0.03)

## 2016-07-02 MED ORDER — SODIUM CHLORIDE 0.9 % IV SOLN
INTRAVENOUS | Status: DC
  Administered 2016-07-03: 21:00:00 via INTRAVENOUS
  Administered 2016-07-03: 1 mL via INTRAVENOUS
  Administered 2016-07-03: 02:00:00 via INTRAVENOUS

## 2016-07-02 MED ORDER — ACETAMINOPHEN 325 MG PO TABS
650.0000 mg | ORAL_TABLET | ORAL | Status: DC | PRN
  Administered 2016-07-03: 14:00:00 650 mg via ORAL
  Filled 2016-07-02: qty 2

## 2016-07-02 MED ORDER — ACETAMINOPHEN 650 MG RE SUPP: 650.0000 mg | RECTAL | Status: DC | PRN

## 2016-07-02 MED ORDER — ACETAMINOPHEN 160 MG/5ML PO SOLN
650.0000 mg | ORAL | Status: DC | PRN
  Filled 2016-07-02: qty 20.3

## 2016-07-02 MED ORDER — SENNOSIDES-DOCUSATE SODIUM 8.6-50 MG PO TABS: 1.0000 | ORAL_TABLET | Freq: Every evening | ORAL | Status: DC | PRN

## 2016-07-02 MED ORDER — ENOXAPARIN SODIUM 40 MG/0.4ML ~~LOC~~ SOLN
40.0000 mg | SUBCUTANEOUS | Status: DC
  Administered 2016-07-03 – 2016-07-05 (×4): 40 mg via SUBCUTANEOUS
  Filled 2016-07-02 (×4): qty 0.4

## 2016-07-02 MED ORDER — STROKE: EARLY STAGES OF RECOVERY BOOK
Freq: Once | Status: AC
  Administered 2016-07-02

## 2016-07-02 NOTE — ED Provider Notes (Signed)
[facility] Emergency Department Provider Note   ____________________________________________    I have reviewed the triage vital signs and the nursing notes.   HISTORY  Chief Complaint Dizziness    HPI Destiny Neal is a 83 y.o. female who presents with complaints of dizziness. Patient reports approximately 6 PM she developed a sensation of the room spinning. It was so severe she was unable to walk, she crawled to her phone and called her friend. Patient does live alone and is able to care for herself. She denies headache. No neuro deficits. No chest pain. No palpitations. She does have nausea and vomiting. She has never had this before. No tinnitus   Past Medical History:  Diagnosis Date  . Arthritis    hips  . Breast cancer ([place])   . Depression   . Hypertension   . Hypothyroidism   . Migraines    none in 8-10 yrs  . Wears hearing aid    bilateral    There are no active problems to display for this patient.   Past Surgical History:  Procedure Laterality Date  . BREAST LUMPECTOMY    . CATARACT EXTRACTION W/PHACO Left 06/21/2015   Procedure: CATARACT EXTRACTION PHACO AND INTRAOCULAR LENS PLACEMENT (IOC);  Surgeon: Ronnell Freshwater, MD;  Location: [facility];  Service: Ophthalmology;  Laterality: Left;  . CATARACT EXTRACTION W/PHACO Right 07/12/2015   Procedure: CATARACT EXTRACTION PHACO AND INTRAOCULAR LENS PLACEMENT ([place]) right;  Surgeon: Ronnell Freshwater, MD;  Location: [facility];  Service: Ophthalmology;  Laterality: Right;  . JOINT REPLACEMENT Left    total hip    Prior to Admission medications   Medication Sig Start Date End Date Taking? Authorizing Provider  amLODipine (NORVASC) 2.5 MG tablet Take 2.5 mg by mouth daily.    Historical Provider, MD  aspirin 81 MG tablet Take 81 mg by mouth daily.    Historical Provider, MD  Calcium Citrate-Vitamin D (CALCIUM + D PO) Take 1 tablet by mouth 2 (two)  times daily.     Historical Provider, MD  dorzolamide (TRUSOPT) 2 % ophthalmic solution Place 1 drop into the right eye 2 (two) times daily.    Historical Provider, MD  latanoprost (XALATAN) 0.005 % ophthalmic solution Place 1 drop into both eyes at bedtime.    Historical Provider, MD  levothyroxine (SYNTHROID, LEVOTHROID) 25 MCG tablet Take 25 mcg by mouth daily before breakfast.    Historical Provider, MD  Multiple Vitamin (MULTIVITAMIN) capsule Take 1 capsule by mouth daily.    Historical Provider, MD  Nutritional Supplements (GRAPESEED EXTRACT PO) Take 100 mg by mouth daily.     Historical Provider, MD  oxyCODONE (ROXICODONE) 5 MG immediate release tablet Take 0.5 tablets (2.5 mg total) by mouth every 6 (six) hours as needed for breakthrough pain (may increase to 5mg  if initial dose is ineffective.). 08/02/15   Carrie Mew, MD  PARoxetine (PAXIL) 10 MG tablet Take 5 mg by mouth daily.    Historical Provider, MD  prochlorperazine (COMPAZINE) 5 MG tablet Take 1 tablet (5 mg total) by mouth every 6 (six) hours as needed for nausea or vomiting. 08/02/15 08/01/16  Carrie Mew, MD  vitamin B-12 (CYANOCOBALAMIN) 1000 MCG tablet Take 2,000 mcg by mouth daily.    Historical Provider, MD     Allergies Lincocin [lincomycin hcl]; Codeine; Hydrocodone; Metronidazole; Penicillins; Tramadol; Betadine [povidone iodine]; and Iodine  History reviewed. No pertinent family history.  Social History Social History  Substance Use  Topics  . Smoking status: Never Smoker  . Smokeless tobacco: Never Used  . Alcohol use No    Review of Systems  Constitutional: No fever/chills Eyes: No visual changes.   Cardiovascular: Denies chest pain.No palpitations Respiratory: Denies shortness of breath. Gastrointestinal: No abdominal pain.   Genitourinary: Negative for dysuria. Musculoskeletal: Negative for back pain. Skin: Negative for rash. Neurological: Negative for headaches   10-point ROS otherwise  negative.  ____________________________________________   PHYSICAL EXAM:  VITAL SIGNS: ED Triage Vitals  Enc Vitals Group     BP 07/02/16 2211 (!) 184/81     Pulse Rate 07/02/16 2149 64     Resp 07/02/16 2149 12     Temp 07/02/16 2149 (!) 94.4 F (34.7 C)     Temp Source 07/02/16 2149 Axillary     SpO2 07/02/16 2142 98 %     Weight 07/02/16 2154 178 lb (80.7 kg)     Height 07/02/16 2154 5\' 5"  (1.651 m)     Head Circumference --      Peak Flow --      Pain Score --      Pain Loc --      Pain Edu? --      Excl. in [place]? --     Constitutional: Alert and oriented. No acute distress. Pleasant and interactive Eyes: Conjunctivae are normal. EOMI, PERRLA  Nose: No congestion/rhinnorhea. Mouth/Throat: Mucous membranes are moist.    Cardiovascular: Normal rate, regular rhythm. Grossly normal heart sounds.  Good peripheral circulation. Respiratory: Normal respiratory effort.  No retractions. Lungs CTAB. Gastrointestinal: Soft and nontender. No distention.  No CVA tenderness. Genitourinary: deferred Musculoskeletal: No lower extremity tenderness nor edema.  Warm and well perfused Neurologic:  Normal speech and language. No gross focal neurologic deficits are appreciated.  Skin:  Skin is warm, dry and intact. No rash noted. Psychiatric: Mood and affect are normal. Speech and behavior are normal.  ____________________________________________   LABS (all labs ordered are listed, but only abnormal results are displayed)  Labs Reviewed  CBC - Abnormal; Notable for the following:       Result Value   WBC 12.1 (*)    All other components within normal limits  COMPREHENSIVE METABOLIC PANEL - Abnormal; Notable for the following:    Glucose, Bld 139 (*)    BUN 23 (*)    All other components within normal limits  TROPONIN I   ____________________________________________  EKG ED ECG REPORT I, Lavonia Drafts, the attending physician, personally viewed and interpreted this  ECG.  Date: 07/02/2016 EKG Time: 9:53 PM Rate: 61 Rhythm: normal sinus rhythm QRS Axis: normal Intervals: normal ST/T Wave abnormalities: normal Conduction Disturbances: none Narrative Interpretation: unremarkable  ____________________________________________  RADIOLOGY  CT head unremarkable ____________________________________________   PROCEDURES  Procedure(s) performed: No    Critical Care performed: No ____________________________________________   INITIAL IMPRESSION / ASSESSMENT AND PLAN / ED COURSE  Pertinent labs & imaging results that were available during my care of the patient were reviewed by me and considered in my medical decision making (see chart for details).  Patient presents with dizziness/vertigo-like symptoms. We will give IV fluids, check labs CT head and reevaluate. No focal weakness.  Lab work and CT are unremarkable. EKG is normal. Patient continues to complain of dizziness. I'll admit for further evaluation       ____________________________________________   FINAL CLINICAL IMPRESSION(S) / ED DIAGNOSES  Final diagnoses:  Vertigo      NEW MEDICATIONS STARTED DURING THIS VISIT:  New Prescriptions   No medications on file     Note:  This document was prepared using Dragon voice recognition software and may include unintentional dictation errors.    Lavonia Drafts, MD 07/02/16 2308

## 2016-07-02 NOTE — H&P (Signed)
[place] @ [facility] Admission History and Physical [employer], D.O.  ---------------------------------------------------------------------------------------------------------------------   PATIENT NAME: Destiny Neal MR#: [id number] DATE OF BIRTH: 1920-11-14 DATE OF ADMISSION: 07/02/2016 PRIMARY CARE PHYSICIAN: No primary care provider on file.  REQUESTING/REFERRING PHYSICIAN: ED Dr. Corky Downs  CHIEF COMPLAINT: Chief Complaint  Patient presents with  . Near Syncope    Pt. reports near syncope episode no LOC  . Nausea    Pt. reports nausea and vomiting today.    HISTORY OF PRESENT ILLNESS: Destiny Neal is a 83 y.o. female with a known history of OA, depression, HTN, hypothyroidism, migraines was in a usual state of health until This evening when she had sudden onset of dizziness associated with left lower extremity weakness, nausea and vomiting.. Patient lives alone and is functionally independent however at this time she was unable to ambulate secondary to dizziness and weakness. She states that she never had this before. She states that her symptoms are resolving however she still reports generalized weakness   Othe and left lower extremity weaknessrwise there has been no change in status. Patient has been taking medication as prescribed and there has been no recent change in medication or diet.  There has been no recent illness, travel or sick contacts.    Patient denies fevers/chills, weakness, dizziness, chest pain, shortness of breath, N/V/C/D, abdominal pain, dysuria/frequency, changes in mental status.   PAST MEDICAL HISTORY: Past Medical History:  Diagnosis Date  . Arthritis    hips  . Breast cancer ([place])   . Depression   . Hypertension   . Hypothyroidism   . Migraines    none in 8-10 yrs  . Wears hearing aid    bilateral      PAST SURGICAL HISTORY: Past Surgical History:  Procedure Laterality Date  . BREAST LUMPECTOMY    . CATARACT  EXTRACTION W/PHACO Left 06/21/2015   Procedure: CATARACT EXTRACTION PHACO AND INTRAOCULAR LENS PLACEMENT (IOC);  Surgeon: Ronnell Freshwater, MD;  Location: [facility];  Service: Ophthalmology;  Laterality: Left;  . CATARACT EXTRACTION W/PHACO Right 07/12/2015   Procedure: CATARACT EXTRACTION PHACO AND INTRAOCULAR LENS PLACEMENT ([place]) right;  Surgeon: Ronnell Freshwater, MD;  Location: [facility];  Service: Ophthalmology;  Laterality: Right;  . JOINT REPLACEMENT Left    total hip      SOCIAL HISTORY: Social History  Substance Use Topics  . Smoking status: Never Smoker  . Smokeless tobacco: Never Used  . Alcohol use No      MEDICATIONS AT HOME: Prior to Admission medications   Medication Sig Start Date End Date Taking? Authorizing Provider  amLODipine (NORVASC) 2.5 MG tablet Take 2.5 mg by mouth daily.    Historical Provider, MD  aspirin 81 MG tablet Take 81 mg by mouth daily.    Historical Provider, MD  Calcium Citrate-Vitamin D (CALCIUM + D PO) Take 1 tablet by mouth 2 (two) times daily.     Historical Provider, MD  dorzolamide (TRUSOPT) 2 % ophthalmic solution Place 1 drop into the right eye 2 (two) times daily.    Historical Provider, MD  latanoprost (XALATAN) 0.005 % ophthalmic solution Place 1 drop into both eyes at bedtime.    Historical Provider, MD  levothyroxine (SYNTHROID, LEVOTHROID) 25 MCG tablet Take 25 mcg by mouth daily before breakfast.    Historical Provider, MD  Multiple Vitamin (MULTIVITAMIN) capsule Take 1 capsule by mouth daily.    Historical Provider, MD  Nutritional Supplements (GRAPESEED EXTRACT PO) Take 100  mg by mouth daily.     Historical Provider, MD  oxyCODONE (ROXICODONE) 5 MG immediate release tablet Take 0.5 tablets (2.5 mg total) by mouth every 6 (six) hours as needed for breakthrough pain (may increase to 5mg  if initial dose is ineffective.). 08/02/15   Carrie Mew, MD  PARoxetine (PAXIL) 10 MG tablet Take 5 mg by mouth  daily.    Historical Provider, MD  prochlorperazine (COMPAZINE) 5 MG tablet Take 1 tablet (5 mg total) by mouth every 6 (six) hours as needed for nausea or vomiting. 08/02/15 08/01/16  Carrie Mew, MD  vitamin B-12 (CYANOCOBALAMIN) 1000 MCG tablet Take 2,000 mcg by mouth daily.    Historical Provider, MD      DRUG ALLERGIES: Allergies  Allergen Reactions  . Lincocin [Lincomycin Hcl] Swelling    angioedema  . Codeine     Severe headache  . Hydrocodone Nausea Only and Other (See Comments)    fainted  . Metronidazole Itching    severe  . Penicillins Swelling  . Tramadol Nausea And Vomiting  . Betadine [Povidone Iodine] Rash    Itching  . Iodine Rash    itching    Family History   Medical History Relation Name Comments  Diabetes Father    Diabetes Mother    High blood pressure (Hypertension) Mother    Stroke Mother     Relation Name Status Comments  Father  Deceased   Mother  Deceased       REVIEW OF SYSTEMS: CONSTITUTIONAL: Positive generalized weakness and dizziness. No fever/chills, fatigue, weight gain/loss, headache EYES: No blurry or double vision. ENT: No tinnitus, postnasal drip, redness or soreness of the oropharynx. RESPIRATORY: No cough, wheeze, hemoptysis, dyspnea. CARDIOVASCULAR: No chest pain, orthopnea, palpitations, syncope. GASTROINTESTINAL: No nausea, vomiting, constipation, diarrhea, abdominal pain, hematemesis, melena or hematochezia. GENITOURINARY: No dysuria or hematuria. ENDOCRINE: No polyuria or nocturia. No heat or cold intolerance. HEMATOLOGY: No anemia, bruising, bleeding. INTEGUMENTARY: No rashes, ulcers, lesions. MUSCULOSKELETAL: No arthritis, swelling, gout. NEUROLOGIC: Positive generalized weakness and left lower extremity weakness. No numbness, tingling. . No seizure-type activity. PSYCHIATRIC: No anxiety, depression, insomnia.  PHYSICAL EXAMINATION: VITAL SIGNS: Blood pressure (!) 184/81, pulse 63, temperature (!)  94.4 F (34.7 C), temperature source Axillary, resp. rate 14, height 5\' 5"  (1.651 m), weight 80.7 kg (178 lb), SpO2 97 %.  GENERAL: 83 y.o.-year-old female patient, well-developed, well-nourished lying in the bed in no acute distress.  Pleasant and cooperative.   HEENT: Head atraumatic, normocephalic. Pupils equal, round, reactive to light and accommodation. No scleral icterus. Extraocular muscles intact. Nares are patent. Oropharynx is clear. Mucus membranes moist. NECK: Supple, full range of motion. No JVD, no bruit heard. No thyroid enlargement, no tenderness, no cervical lymphadenopathy. CHEST: Normal breath sounds bilaterally. No wheezing, rales, rhonchi or crackles. No use of accessory muscles of respiration.  No reproducible chest wall tenderness.  CARDIOVASCULAR: S1, S2 normal. No murmurs, rubs, or gallops. Cap refill <2 seconds. ABDOMEN: Soft, nontender, nondistended. No rebound, guarding, rigidity. Normoactive bowel sounds present in all four quadrants. No organomegaly or mass. EXTREMITIES: Full range of motion. No pedal edema, cyanosis, or clubbing. NEUROLOGIC: Cranial nerves II through XII are grossly intact with no focal sensorimotor deficit. Muscle strength 5/5 in all extremities except left lower extremity which is 3/5. Sensation intact. Gait not checked. PSYCHIATRIC: The patient is alert and oriented x 3. Normal affect, mood, thought content. SKIN: Warm, dry, and intact without obvious rash, lesion, or ulcer.  LABORATORY PANEL:  CBC  Recent  Labs Lab 07/02/16 2202  WBC 12.1*  HGB 15.3  HCT 45.7  PLT 195   ----------------------------------------------------------------------------------------------------------------- Chemistries No results for input(s): NA, K, CL, CO2, GLUCOSE, BUN, CREATININE, CALCIUM, MG, AST, ALT, ALKPHOS, BILITOT in the last 168 hours.  Invalid input(s):  GFRCGP ------------------------------------------------------------------------------------------------------------------ Cardiac Enzymes No results for input(s): TROPONINI in the last 168 hours. ------------------------------------------------------------------------------------------------------------------  RADIOLOGY: No results found.  EKG: Sinus rhythm at 61 bpm with normal axis and nonspecific ST and T wave changes  IMPRESSION AND PLAN:  This is a 83 y.o. female with a history of OA, depression, HTN, hypothyroidism, migraines  now being admitted with:  1. TIA rule out CVA -  - Admit telemetry observation for neuro workup including: - Studies: MRA/MRI, Echo, Carotids - Labs: CBC, BMP, Lipids, TFTs, A1C - Nursing: Neurochecks, O2, dysphagia screen, permissive hypertension.  - Consults: Neurology, PT/OT, S/S consults.  - Meds: Daily aspirin 81mg .   - Fluids: IVNS@75cc /hr.   - Routine DVT Px: with Lovenox, SCDs, early ambulation  2. History of HTN - Continue Norvasc with hold parameters  3. H/O hypothyroidism - Continue Synthriod  4. History of depression - Continue Paxis  Continue eye drops, Vitamin B12, MVI, Ca+D  Admission status: Observation, telemetry IVFs: NS Diet: NPO: --> Heart healthy DVT Px: Lovenox, SCDs, early ambulation Code Status: Full Disposition Status: To home in <24 hours  All the records are reviewed and case discussed with ED provider. Management plans discussed with the patient and/or family who express understanding and agree with plan of care.   TOTAL TIME TAKING CARE OF THIS PATIENT: 60 minutes.   Marian Grandt D.O. on 07/02/2016 at 10:33 PM Between 7am to 6pm - Pager - [phone] After 6pm go to [email or web address] - Marketing executive [facility] Office [phone] CC: Primary care physician; No primary care provider on file.     Note: This dictation was prepared with Dragon dictation along with smaller  phrase technology. Any transcriptional errors that result from this process are unintentional.

## 2016-07-02 NOTE — ED Triage Notes (Signed)
Pt. Reports near syncope episode around 6:30 this evening.  Pt. Reports vomiting up an orange shortly after. Pt. Denies LOC or injury.

## 2016-07-03 ENCOUNTER — Observation Stay: Payer: Medicare Other

## 2016-07-03 ENCOUNTER — Observation Stay (HOSPITAL_COMMUNITY)
Admit: 2016-07-03 | Discharge: 2016-07-03 | Disposition: A | Discharge status: Home / Self Care | Payer: Medicare Other | Attending: Family Medicine | Admitting: Family Medicine

## 2016-07-03 DIAGNOSIS — I639 Cerebral infarction, unspecified: Principal | ICD-10-CM

## 2016-07-03 DIAGNOSIS — G459 Transient cerebral ischemic attack, unspecified: Secondary | ICD-10-CM

## 2016-07-03 LAB — URINALYSIS, DIPSTICK ONLY
Bilirubin Urine: NEGATIVE
GLUCOSE, UA: NEGATIVE mg/dL
KETONES UR: 5 mg/dL — AB
Nitrite: NEGATIVE
PROTEIN: NEGATIVE mg/dL
Specific Gravity, Urine: 1.012 (ref 1.005–1.030)
pH: 7 (ref 5.0–8.0)

## 2016-07-03 LAB — TROPONIN I
Troponin I: <0.03 ng/mL (ref <0.03)
Troponin I: <0.03 ng/mL (ref <0.03)
Troponin I: <0.03 ng/mL (ref <0.03)

## 2016-07-03 LAB — LIPID PANEL
Cholesterol: 190 mg/dL (ref 0–200)
HDL: 63 mg/dL (ref >40)
LDL CALC: 120 mg/dL — AB (ref 0–99)
TRIGLYCERIDES: 36 mg/dL (ref <150)
Total CHOL/HDL Ratio: 3 RATIO
VLDL: 7 mg/dL (ref 0–40)

## 2016-07-03 LAB — URINE DRUG SCREEN, QUALITATIVE (ARMC ONLY)
Amphetamines, Ur Screen: NOT DETECTED
Barbiturates, Ur Screen: NOT DETECTED
Benzodiazepine, Ur Scrn: NOT DETECTED
CANNABINOID 50 NG, UR ~~LOC~~: NOT DETECTED
COCAINE METABOLITE, UR ~~LOC~~: NOT DETECTED
MDMA (ECSTASY) UR SCREEN: NOT DETECTED
Methadone Scn, Ur: NOT DETECTED
Opiate, Ur Screen: NOT DETECTED
Phencyclidine (PCP) Ur S: NOT DETECTED
TRICYCLIC, UR SCREEN: NOT DETECTED

## 2016-07-03 MED ORDER — CALCIUM CITRATE-VITAMIN D 315-200 MG-UNIT PO TABS: 1.0000 | ORAL_TABLET | Freq: Two times a day (BID) | ORAL | Status: DC

## 2016-07-03 MED ORDER — VITAMIN B-12 1000 MCG PO TABS
2000.0000 ug | ORAL_TABLET | Freq: Every day | ORAL | Status: DC
  Administered 2016-07-03 – 2016-07-06 (×4): 2000 ug via ORAL
  Filled 2016-07-03 (×4): qty 2

## 2016-07-03 MED ORDER — MULTIVITAMINS PO CAPS: 1.0000 | ORAL_CAPSULE | Freq: Every day | ORAL | Status: DC

## 2016-07-03 MED ORDER — CALCIUM CARBONATE-VITAMIN D 500-200 MG-UNIT PO TABS
1.0000 | ORAL_TABLET | Freq: Every day | ORAL | Status: DC
  Administered 2016-07-03 – 2016-07-06 (×4): 1 via ORAL
  Filled 2016-07-03 (×4): qty 1

## 2016-07-03 MED ORDER — DORZOLAMIDE HCL-TIMOLOL MAL PF 22.3-6.8 MG/ML OP SOLN
1.0000 [drp] | Freq: Two times a day (BID) | OPHTHALMIC | Status: DC
  Administered 2016-07-03 (×2): 1 [drp] via OPHTHALMIC
  Filled 2016-07-03 (×3): qty 0.1

## 2016-07-03 MED ORDER — IBUPROFEN 600 MG PO TABS
600.0000 mg | ORAL_TABLET | Freq: Four times a day (QID) | ORAL | Status: DC | PRN
  Administered 2016-07-03: 600 mg via ORAL
  Filled 2016-07-03: qty 1

## 2016-07-03 MED ORDER — AMLODIPINE BESYLATE 5 MG PO TABS
2.5000 mg | ORAL_TABLET | Freq: Every day | ORAL | Status: DC
  Administered 2016-07-03: 2.5 mg via ORAL
  Filled 2016-07-03: qty 1

## 2016-07-03 MED ORDER — DORZOLAMIDE HCL-TIMOLOL MAL PF 22.3-6.8 MG/ML OP SOLN
1.0000 [drp] | Freq: Two times a day (BID) | OPHTHALMIC | Status: DC
  Administered 2016-07-03 – 2016-07-06 (×5): 1 [drp] via OPHTHALMIC
  Filled 2016-07-03 (×7): qty 0.1

## 2016-07-03 MED ORDER — CLOPIDOGREL BISULFATE 75 MG PO TABS
75.0000 mg | ORAL_TABLET | Freq: Every day | ORAL | Status: DC
  Administered 2016-07-03 – 2016-07-06 (×4): 75 mg via ORAL
  Filled 2016-07-03 (×4): qty 1

## 2016-07-03 MED ORDER — PAROXETINE HCL 10 MG PO TABS
5.0000 mg | ORAL_TABLET | Freq: Every day | ORAL | Status: DC
  Administered 2016-07-03 – 2016-07-06 (×4): 5 mg via ORAL
  Filled 2016-07-03 (×2): qty 1
  Filled 2016-07-03: qty 0.5
  Filled 2016-07-03: qty 1
  Filled 2016-07-03: qty 0.5

## 2016-07-03 MED ORDER — HYDROCORTISONE 1 % EX CREA
TOPICAL_CREAM | Freq: Two times a day (BID) | CUTANEOUS | Status: DC
  Administered 2016-07-03: 21:00:00 via TOPICAL
  Filled 2016-07-03: qty 28

## 2016-07-03 MED ORDER — ASPIRIN EC 81 MG PO TBEC
81.0000 mg | DELAYED_RELEASE_TABLET | Freq: Every day | ORAL | Status: DC
  Administered 2016-07-03 – 2016-07-06 (×4): 81 mg via ORAL
  Filled 2016-07-03 (×4): qty 1

## 2016-07-03 MED ORDER — LEVOTHYROXINE SODIUM 50 MCG PO TABS
25.0000 ug | ORAL_TABLET | Freq: Every day | ORAL | Status: DC
  Administered 2016-07-03 – 2016-07-06 (×4): 25 ug via ORAL
  Filled 2016-07-03 (×4): qty 1

## 2016-07-03 MED ORDER — ADULT MULTIVITAMIN W/MINERALS CH
1.0000 | ORAL_TABLET | Freq: Every day | ORAL | Status: DC
  Administered 2016-07-03 – 2016-07-06 (×4): 1 via ORAL
  Filled 2016-07-03 (×4): qty 1

## 2016-07-03 MED ORDER — LATANOPROST 0.005 % OP SOLN
1.0000 [drp] | Freq: Every day | OPHTHALMIC | Status: DC
  Administered 2016-07-03 – 2016-07-05 (×4): 1 [drp] via OPHTHALMIC
  Filled 2016-07-03: qty 2.5

## 2016-07-03 MED ORDER — DORZOLAMIDE HCL-TIMOLOL MAL 2-0.5 % OP SOLN
1.0000 [drp] | Freq: Two times a day (BID) | OPHTHALMIC | Status: DC
  Filled 2016-07-03: qty 10

## 2016-07-03 MED ORDER — ASPIRIN 325 MG PO TABS
325.0000 mg | ORAL_TABLET | Freq: Once | ORAL | Status: AC
  Administered 2016-07-03: 02:00:00 325 mg via ORAL
  Filled 2016-07-03: qty 1

## 2016-07-03 MED ORDER — DORZOLAMIDE HCL-TIMOLOL MAL PF 22.3-6.8 MG/ML OP SOLN: 1.0000 [drp] | Freq: Two times a day (BID) | OPHTHALMIC | Status: DC

## 2016-07-03 NOTE — Evaluation (Signed)
Occupational Therapy Evaluation Patient Details Name: Destiny Neal MRN: [id number] DOB: 09/13/1920 Today's Date: 07/03/2016    History of Present Illness 83yo female pt who presented to ED on 4/1 w/ dizziness. PMHx includes arthritis (hips), breast cancer 2009, depression, HTN, hypothyroidism, migraines (none in past 8-75yrs), and RUE fx (2015). CT results negative, MRI indicates acute L PICA infarct.   Clinical Impression   Pt seen for OT evaluation this date. Pt was an active, independent 83yo living by herself and independent with all ADL, IADL, driving and using a quad cane for mobility except for overnight when she used a 2WW for going to the bathroom. Daughter/pt report decreased energy over the past couple weeks, but still able to participate in normal daily activities and attend church. Pt reports having HHPT services to work on balance which "really helped" prior to this hospitalization. No reports of falls in past 12 months. Pt presents with pain (back of L hand where IV is- RN notified), impaired balance/strength/activity tolerance/knowledge of AE/DME, and increased need for assistance with ADL. Pt would benefit from skilled OT services to address noted impairments and functional deficits, including education/training in AE/DME for ADL, home/routines modifications, falls prevention, and energy conservation strategies to support maximal return to PLOF and minimize risk of falls. Pt is a good candidate for HHOT upon discharge.     Follow Up Recommendations  Home health OT    Equipment Recommendations  None recommended by OT    Recommendations for Other Services       Precautions / Restrictions Precautions Precautions: Fall Restrictions Weight Bearing Restrictions: No      Mobility Bed Mobility Overal bed mobility: Needs Assistance Bed Mobility: Supine to Sit;Sit to Supine     Supine to sit: Min assist;HOB elevated Sit to supine: Min guard   General bed mobility  comments: initial Min A for BLE mgt given by RN, but with additional time to perform, pt able to get back in bed with min guard  Transfers Overall transfer level: Needs assistance Equipment used: None Transfers: Stand Pivot Transfers   Stand pivot transfers: Min assist       General transfer comment: SPT to [facility]    Balance Overall balance assessment: Needs assistance Sitting-balance support: Single extremity supported;Feet supported Sitting balance-Leahy Scale: Good     Standing balance support: Bilateral upper extremity supported;During functional activity Standing balance-Leahy Scale: Fair                             ADL either performed or assessed with clinical judgement   ADL Overall ADL's : Needs assistance/impaired Eating/Feeding: Sitting;Set up   Grooming: Set up;Sitting   Upper Body Bathing: Sitting;Set up;Supervision/ safety   Lower Body Bathing: Minimal assistance;Sit to/from stand;Sitting/lateral leans;Set up   Upper Body Dressing : Sitting;Set up;Supervision/safety       Toilet Transfer: Minimal assistance;BSC;Stand-pivot   Toileting- Clothing Manipulation and Hygiene: Min guard;Sit to/from stand;Set up       Functional mobility during ADLs:  (deferred after toileting on BSC due to elevated BP once back in bed) General ADL Comments: Generally Min A for LB ADL     Vision Baseline Vision/History: Wears glasses;Glaucoma (reports glaucoma in L eye) Wears Glasses: At all times Patient Visual Report: Blurring of vision (slightly more blurry vision in past few weeks, has an appointment scheduled with eye doctor) Vision Assessment?: Yes Eye Alignment: Within Functional Limits Ocular Range of Motion: Within Functional Limits Alignment/Gaze  Preference: Within Defined Limits Tracking/Visual Pursuits: Able to track stimulus in all quads without difficulty Visual Fields: No apparent deficits     Perception     Praxis Praxis Praxis tested?:  Within functional limits    Pertinent Vitals/Pain Pain Assessment: 0-10 Pain Score: 6  Pain Location: back of L hand where IV site is Pain Descriptors / Indicators: Burning;Guarding;Grimacing Pain Intervention(s): Patient requesting pain meds-RN notified     Hand Dominance     Extremity/Trunk Assessment Upper Extremity Assessment Upper Extremity Assessment: Generalized weakness (grossly 4/5 bilat, ROM WFL)   Lower Extremity Assessment Lower Extremity Assessment: Defer to PT evaluation;Generalized weakness   Cervical / Trunk Assessment Cervical / Trunk Assessment: Normal   Communication Communication Communication: HOH (has bilateral hearing aids)   Cognition Arousal/Alertness: Awake/alert Behavior During Therapy: WFL for tasks assessed/performed Overall Cognitive Status: Within Functional Limits for tasks assessed                                     General Comments       Exercises     Shoulder Instructions      Home Living Family/patient expects to be discharged to:: Private residence Living Arrangements: Alone Available Help at Discharge: Family;Available 24 hours/day;Available PRN/intermittently Type of Home: House Home Access: Stairs to enter [employer] of Steps: 4 Entrance Stairs-Rails: Can reach both;Left;Right Home Layout: One level     Bathroom Shower/Tub: Tub/shower unit;Door;Walk-in shower (uses walk-in shower)   Bathroom Toilet: Standard     Home Equipment: Environmental consultant - 2 wheels;Cane - quad;Cane - single point;Bedside commode;Shower seat;Adaptive equipment;Hand held shower head;Grab bars - tub/shower Adaptive Equipment: Reacher        Prior Functioning/Environment Level of Independence: Independent with assistive device(s)        Comments: Pt was indep with all ADL, IADL, driving, active in church at baseline, ambulating with quad cane at all times except when using 2WW overnight when going to the bathroom         OT Problem List: Decreased strength;Decreased activity tolerance;Decreased knowledge of use of DME or AE;Impaired balance (sitting and/or standing)      OT Treatment/Interventions: Self-care/ADL training;Energy conservation;Patient/family education;DME and/or AE instruction    OT Goals(Current goals can be found in the care plan section) Acute Rehab OT Goals Patient Stated Goal: go home OT Goal Formulation: With patient/family Time For Goal Achievement: 07/17/16 Potential to Achieve Goals: Good  OT Frequency: Min 1X/week   Barriers to D/C:            Co-evaluation              End of Session Nurse Communication: Patient requests pain meds  Activity Tolerance: Patient tolerated treatment well;Other (comment) (treatment limited by elevated BP) Patient left: in bed;with call bell/phone within reach;with family/visitor present;Other (comment) (with neurologist in room)  OT Visit Diagnosis: Other abnormalities of gait and mobility (R26.89);Pain;Muscle weakness (generalized) (M62.81);Dizziness and giddiness (R42) Pain - Right/Left: Left Pain - part of body: Hand                Time: 1517-6160 OT Time Calculation (min): 28 min Charges:  OT General Charges $OT Visit: 1 Procedure OT Evaluation $OT Eval Low Complexity: 1 Procedure OT Treatments $Self Care/Home Management : 8-22 mins G-Codes: OT G-codes **NOT FOR INPATIENT CLASS** Functional Limitation: Self care Self Care Current Status (V3710): At least 20 percent but less than 40 percent impaired,  limited or restricted Self Care Goal Status [phone]): At least 1 percent but less than 20 percent impaired, limited or restricted   Jeni Salles, MPH, MS, OTR/L ascom [phone] 07/03/16, 12:48 PM

## 2016-07-03 NOTE — Evaluation (Signed)
Physical Therapy Evaluation Patient Details Name: Destiny Neal MRN: [id number] DOB: January 29, 1921 Today's Date: 07/03/2016   History of Present Illness  Pt is a 83 yo female pt who presented to ED on 4/1 w/ dizziness. PMHx includes arthritis (hips), breast cancer 2009, depression, HTN, hypothyroidism, migraines (none in past 8-46yrs), and RUE fx (2015). 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  Clinical Impression  Prior to hospital admission, pt was modified independent ambulating with quad cane during the day but used RW during the night.  Pt lives alone in 1 level home with stairs to enter.  Currently pt is CGA to min assist with bed mobility and transfers using RW (except mod assist to control descent sitting after pt c/o dizziness standing and needing to sit back down d/t feeling "weak").  Pt c/o dizziness after standing and dizziness did not resolve with sitting or laying back down in bed (nursing notified).  Pt would benefit from skilled PT to address noted impairments and functional limitations; anticipate pt would benefit from gaze stabilization exercises.  Recommend pt discharge to STR when medically appropriate.    Follow Up Recommendations SNF    Equipment Recommendations  Rolling walker with 5" wheels    Recommendations for Other Services       Precautions / Restrictions Precautions Precautions: Fall Precaution Comments: Seizures Restrictions Weight Bearing Restrictions: No      Mobility  Bed Mobility Overal bed mobility: Needs Assistance Bed Mobility: Supine to Sit;Sit to Supine     Supine to sit: Min assist;HOB elevated Sit to supine: Min guard   General bed mobility comments: assist for trunk and increased effort and time to perform by pt supine to sit; 2 assist to boost pt up in bed end of session  Transfers Overall transfer level: Needs assistance Equipment used: Rolling walker (2 wheeled) Transfers: Sit  to/from Stand   Stand pivot transfers: Min assist;Mod assist       General transfer comment: min assist to initiate stand up to RW; mod assist to control descent sitting back down onto bed  Ambulation/Gait Ambulation/Gait assistance: Min guard Ambulation Distance (Feet):  (pt marched in place x5 reps B LE's) Assistive device: Rolling walker (2 wheeled)       General Gait Details: CGA for safety; limited ambulation d/t pt c/o dizziness once standing and feeling "weak" needing to sit back down  Stairs            Wheelchair Mobility    Modified Rankin (Stroke Patients Only)       Balance Overall balance assessment: Needs assistance Sitting-balance support: No upper extremity supported;Feet supported Sitting balance-Leahy Scale: Good Sitting balance - Comments: sitting reaching within and outside of BOS   Standing balance support: Bilateral upper extremity supported;During functional activity Standing balance-Leahy Scale: Fair Standing balance comment: standing marching                             Pertinent Vitals/Pain Pain Assessment: 0-10 Pain Score: 5  Pain Location: headache Pain Descriptors / Indicators: Headache Pain Intervention(s): Limited activity within patient's tolerance;Monitored during session;Repositioned  BP 147/61-163/70 during session (nursing notified); HR and O2 on room air WFL during session.    Home Living Family/patient expects to be discharged to:: Private residence Living Arrangements: Alone Available Help at Discharge: Family;Available 24 hours/day;Available PRN/intermittently Type of Home: House Home Access: Stairs to enter Entrance Stairs-Rails: Can reach both;Left;Right Entrance  Stairs-Number of Steps: 3-4 Home Layout: One level Home Equipment: [place] - 2 wheels;Cane - quad;Cane - single point;Bedside commode;Shower seat;Adaptive equipment;Hand held shower head;Grab bars - tub/shower;Grab bars - toilet      Prior  Function Level of Independence: Independent with assistive device(s)         Comments: Pt was indep with all ADL, IADL, driving, active in church at baseline, ambulating with quad cane at all times except when using 2WW overnight when going to the bathroom     Hand Dominance        Extremity/Trunk Assessment   Upper Extremity Assessment Upper Extremity Assessment: Defer to OT evaluation    Lower Extremity Assessment Lower Extremity Assessment:  (4+/5 B hip flexion, knee flexion/extension, and DF strength; sensation intact B LE's; proprioception intact B LE's; normal LE tone; heel to shin coordination B LE's intact)    Cervical / Trunk Assessment Cervical / Trunk Assessment: Normal  Communication   Communication: HOH (wears B hearing aides)  Cognition Arousal/Alertness: Awake/alert Behavior During Therapy: WFL for tasks assessed/performed Overall Cognitive Status: Within Functional Limits for tasks assessed                                        General Comments General comments (skin integrity, edema, etc.): [name] daughter present during session.  Nursing cleared pt for participation in physical therapy.  Pt agreeable to PT session.    Exercises     Assessment/Plan    PT Assessment Patient needs continued PT services  PT Problem List Decreased balance;Decreased mobility;Decreased knowledge of precautions       PT Treatment Interventions DME instruction;Gait training;Stair training;Functional mobility training;Therapeutic activities;Therapeutic exercise;Balance training;Patient/family education;Neuromuscular re-education    PT Goals (Current goals can be found in the Care Plan section)  Acute Rehab PT Goals Patient Stated Goal: go home PT Goal Formulation: With patient Time For Goal Achievement: 07/17/16 Potential to Achieve Goals: Fair    Frequency 7X/week   Barriers to discharge Other (comment) Question level of assist available     Co-evaluation               End of Session Equipment Utilized During Treatment: Gait belt Activity Tolerance: Other (comment) (Limited d/t c/o dizziness when standing) Patient left: in bed;with call bell/phone within reach;with bed alarm set;with family/visitor present Nurse Communication: Mobility status;Precautions PT Visit Diagnosis: Difficulty in walking, not elsewhere classified (R26.2);Dizziness and giddiness (R42)    Time: 6761-9509 PT Time Calculation (min) (ACUTE ONLY): 43 min   Charges:   PT Evaluation $PT Eval Low Complexity: 1 Procedure     PT G Codes:   PT G-Codes **NOT FOR INPATIENT CLASS** Functional Assessment Tool Used: AM-PAC 6 Clicks Basic Mobility Functional Limitation: Mobility: Walking and moving around Mobility: Walking and Moving Around Current Status (T2671): At least 40 percent but less than 60 percent impaired, limited or restricted Mobility: Walking and Moving Around Goal Status [phone]): At least 1 percent but less than 20 percent impaired, limited or restricted    Leitha Bleak, PT 07/03/16, 4:53 PM [phone]

## 2016-07-03 NOTE — Consult Note (Signed)
Referring Physician: Vianne Bulls    Chief Complaint: Dizziness  HPI: Destiny Neal is an 83 y.o. female who reports sudden onset of dizziness on yesterday.  She describes the dizziness as being off balance.  Also reports that although she has some difficulties with her left leg this worsened about a week ago when her leg gave out on her.  She does not feel that her strength has returned to baseline.  Initial NIHSS of 0.    Date last known well: Date: 07/02/2016 Time last known well: Time: 17:30 tPA Given: No: Outside time window  Past Medical History:  Diagnosis Date  . Arthritis    hips  . Breast cancer ([place])   . Depression   . Hypertension   . Hypothyroidism   . Migraines    none in 8-10 yrs  . Wears hearing aid    bilateral    Past Surgical History:  Procedure Laterality Date  . BREAST LUMPECTOMY    . CATARACT EXTRACTION W/PHACO Left 06/21/2015   Procedure: CATARACT EXTRACTION PHACO AND INTRAOCULAR LENS PLACEMENT (IOC);  Surgeon: Ronnell Freshwater, MD;  Location: [facility];  Service: Ophthalmology;  Laterality: Left;  . CATARACT EXTRACTION W/PHACO Right 07/12/2015   Procedure: CATARACT EXTRACTION PHACO AND INTRAOCULAR LENS PLACEMENT ([place]) right;  Surgeon: Ronnell Freshwater, MD;  Location: [facility];  Service: Ophthalmology;  Laterality: Right;  . JOINT REPLACEMENT Left    total hip    Family history: Mother with DM, HTN and stroke.  Father with DM.  Both deceased.    Social History:  reports that she has never smoked. She has never used smokeless tobacco. She reports that she does not drink alcohol. Her drug history is not on file.  Allergies:  Allergies  Allergen Reactions  . Lincocin [Lincomycin Hcl] Swelling    angioedema  . Codeine     Severe headache  . Hydrocodone Nausea Only and Other (See Comments)    fainted  . Metronidazole Itching    severe  . Penicillins Swelling  . Tramadol Nausea And Vomiting  . Betadine [Povidone  Iodine] Rash    Itching  . Iodine Rash    itching    Medications:  I have reviewed the patient's current medications. Prior to Admission:  Prescriptions Prior to Admission  Medication Sig Dispense Refill Last Dose  . amLODipine (NORVASC) 2.5 MG tablet Take 2.5 mg by mouth daily.   08/01/2015 at Unknown time  . aspirin 81 MG tablet Take 81 mg by mouth daily.   08/01/2015 at Unknown time  . Calcium Citrate-Vitamin D (CALCIUM + D PO) Take 1 tablet by mouth 2 (two) times daily.    08/01/2015 at Unknown time  . Cyanocobalamin (B-12 IJ) Inject 1,000 Units as directed every 30 (thirty) days.     . dorzolamide-timolol (COSOPT) 22.3-6.8 MG/ML ophthalmic solution Place 1 drop into both eyes 2 (two) times daily.     Marland Kitchen latanoprost (XALATAN) 0.005 % ophthalmic solution Place 1 drop into both eyes at bedtime.   08/01/2015 at Unknown time  . levothyroxine (SYNTHROID, LEVOTHROID) 25 MCG tablet Take 25 mcg by mouth daily before breakfast.   08/01/2015 at Unknown time  . Multiple Vitamin (MULTIVITAMIN) capsule Take 1 capsule by mouth daily.   08/01/2015 at Unknown time  . Nutritional Supplements (GRAPESEED EXTRACT PO) Take 100 mg by mouth daily.    08/01/2015 at Unknown time  . PARoxetine (PAXIL) 10 MG tablet Take 5 mg by mouth daily.   08/01/2015  at Unknown time  . vitamin B-12 (CYANOCOBALAMIN) 1000 MCG tablet Take 2,000 mcg by mouth daily.   08/01/2015 at Unknown time   Scheduled: . amLODipine  2.5 mg Oral Daily  . aspirin EC  81 mg Oral Daily  . calcium-vitamin D  1 tablet Oral Q breakfast  . dorzolamidel-timolol  1 drop Both Eyes BID  . enoxaparin (LOVENOX) injection  40 mg Subcutaneous Q24H  . latanoprost  1 drop Both Eyes QHS  . levothyroxine  25 mcg Oral QAC breakfast  . multivitamin with minerals  1 tablet Oral Daily  . PARoxetine  5 mg Oral Daily  . vitamin B-12  2,000 mcg Oral Daily    ROS: History obtained from the patient  General ROS: negative for - chills, fatigue, fever, night sweats,  weight gain or weight loss Psychological ROS: negative for - behavioral disorder, hallucinations, memory difficulties, mood swings or suicidal ideation Ophthalmic ROS: negative for - blurry vision, double vision, eye pain or loss of vision ENT ROS: negative for - epistaxis, nasal discharge, oral lesions, sore throat, tinnitus or vertigo Allergy and Immunology ROS: negative for - hives or itchy/watery eyes Hematological and Lymphatic ROS: negative for - bleeding problems, bruising or swollen lymph nodes Endocrine ROS: negative for - galactorrhea, hair pattern changes, polydipsia/polyuria or temperature intolerance Respiratory ROS: negative for - cough, hemoptysis, shortness of breath or wheezing Cardiovascular ROS: negative for - chest pain, dyspnea on exertion, edema or irregular heartbeat Gastrointestinal ROS: negative for - abdominal pain, diarrhea, hematemesis, nausea/vomiting or stool incontinence Genito-Urinary ROS: negative for - dysuria, hematuria, incontinence or urinary frequency/urgency Musculoskeletal ROS: left hip pain Neurological ROS: as noted in HPI Dermatological ROS: negative for rash and skin lesion changes  Physical Examination: Blood pressure (!) 156/62, pulse 65, temperature 98 F (36.7 C), temperature source Oral, resp. rate 20, height 5\' 5"  (1.651 m), weight 79.3 kg (174 lb 12.8 oz), SpO2 97 %.  HEENT-  Normocephalic, no lesions, without obvious abnormality.  Normal external eye and conjunctiva.  Normal TM's bilaterally.  Normal auditory canals and external ears. Normal external nose, mucus membranes and septum.  Normal pharynx. Cardiovascular- S1, S2 normal, pulses palpable throughout   Lungs- chest clear, no wheezing, rales, normal symmetric air entry Abdomen- soft, non-tender; bowel sounds normal; no masses,  no organomegaly Extremities- no edema Lymph-no adenopathy palpable Musculoskeletal-no joint tenderness, deformity or swelling Skin-warm and dry, no  hyperpigmentation, vitiligo, or suspicious lesions  Neurological Examination   Mental Status: Alert, oriented, thought content appropriate.  Speech fluent without evidence of aphasia.  Able to follow 3 step commands without difficulty. Cranial Nerves: II: Discs flat bilaterally; Visual fields grossly normal, pupils equal, round, reactive to light and accommodation III,IV, VI: ptosis not present, extra-ocular motions intact bilaterally V,VII: mild decrease in the left NLF, facial light touch sensation normal bilaterally VIII: hearing normal bilaterally IX,X: gag reflex present XI: bilateral shoulder shrug XII: midline tongue extension Motor: Right : Upper extremity   5/5    Left:     Upper extremity   5/5  Lower extremity   5/5     Lower extremity   4-/5 Tone and bulk:normal tone throughout; no atrophy noted Sensory: Pinprick and light touch intact throughout, bilaterally Deep Tendon Reflexes: 2+ and symmetric with absent AJ's bilaterally Plantars: Right: mute   Left: mute Cerebellar: Normal finger-to-nose and normal heel-to-shin testing bilaterally Gait: not tested due to safety concerns    Laboratory Studies:  Basic Metabolic Panel:  Recent Labs Lab 07/02/16  2202  NA 137  K 3.5  CL 103  CO2 26  GLUCOSE 139*  BUN 23*  CREATININE 0.79  CALCIUM 9.4    Liver Function Tests:  Recent Labs Lab 07/02/16 2202  AST 26  ALT 17  ALKPHOS 64  BILITOT 0.6  PROT 7.3  ALBUMIN 4.1   No results for input(s): LIPASE, AMYLASE in the last 168 hours. No results for input(s): AMMONIA in the last 168 hours.  CBC:  Recent Labs Lab 07/02/16 2202  WBC 12.1*  HGB 15.3  HCT 45.7  MCV 94.1  PLT 195    Cardiac Enzymes:  Recent Labs Lab 07/02/16 2202 07/03/16 0407  TROPONINI <0.03 <0.03    BNP: Invalid input(s): POCBNP  CBG: No results for input(s): GLUCAP in the last 168 hours.  Microbiology: No results found for this or any previous visit.  Coagulation  Studies: No results for input(s): LABPROT, INR in the last 72 hours.  Urinalysis:  Recent Labs Lab 07/03/16 0108  COLORURINE YELLOW*  LABSPEC 1.012  PHURINE 7.0  GLUCOSEU NEGATIVE  HGBUR SMALL*  BILIRUBINUR NEGATIVE  KETONESUR 5*  PROTEINUR NEGATIVE  NITRITE NEGATIVE  LEUKOCYTESUR LARGE*    Lipid Panel:    Component Value Date/Time   CHOL 190 07/03/2016 0407   TRIG 36 07/03/2016 0407   HDL 63 07/03/2016 0407   CHOLHDL 3.0 07/03/2016 0407   VLDL 7 07/03/2016 0407   LDLCALC 120 (H) 07/03/2016 0407    HgbA1C: No results found for: HGBA1C  Urine Drug Screen:     Component Value Date/Time   LABOPIA NONE DETECTED 07/03/2016 0108   COCAINSCRNUR NONE DETECTED 07/03/2016 0108   LABBENZ NONE DETECTED 07/03/2016 0108   AMPHETMU NONE DETECTED 07/03/2016 0108   THCU NONE DETECTED 07/03/2016 0108   LABBARB NONE DETECTED 07/03/2016 0108    Alcohol Level: No results for input(s): ETH in the last 168 hours.   Imaging: Dg Chest 2 View  Result Date: 07/03/2016 CLINICAL DATA:  Transient ischemic attack, emphysema EXAM: CHEST  2 VIEW COMPARISON:  None. FINDINGS: Emphysematous hyperinflation of the lungs with scarring in the left upper lobe. No pneumonic consolidation or CHF. No pneumothorax or effusion. The heart is top-normal in size. There is aortic atherosclerosis. There is dextroconvex curvature of the lower thoracic spine. Small to moderate-sized hiatal hernia with air-fluid level is noted. Chronic impacted right humeral neck fracture with healing. IMPRESSION: 1. Emphysematous hyperinflation of lungs with left upper lobe scarring. 2. Chronic fracture of the right surgical neck. 3. Small to moderate-sized hiatal hernia 4. Aortic atherosclerosis Electronically Signed   By: Ashley Royalty M.D.   On: 07/03/2016 00:09   Ct Head Wo Contrast  Result Date: 07/02/2016 CLINICAL DATA:  Near syncopal episode this evening, subsequently vomited. No loss of consciousness. History of migraines,  hypertension and breast cancer. EXAM: CT HEAD WITHOUT CONTRAST TECHNIQUE: Contiguous axial images were obtained from the base of the skull through the vertex without intravenous contrast. COMPARISON:  MRI of the head Aug 02, 2015 FINDINGS: BRAIN: No intraparenchymal hemorrhage, mass effect nor midline shift. The ventricles and sulci are normal for age. Patchy supratentorial white matter hypodensities less than expected for patient's age, though non-specific are most compatible with chronic small vessel ischemic disease. No acute large vascular territory infarcts. No abnormal extra-axial fluid collections. Basal cisterns are patent. VASCULAR: Mild calcific atherosclerosis of the carotid siphons. SKULL: No skull fracture. Mild temporomandibular osteoarthrosis. No significant scalp soft tissue swelling. SINUSES/ORBITS: Partially imaged trace LEFT sphenoid sinus  air-fluid level. Imaged mastoid air cells are well aerated. The included ocular globes and orbital contents are non-suspicious. OTHER: None. IMPRESSION: Negative CT HEAD for age. Electronically Signed   By: Elon Alas M.D.   On: 07/02/2016 22:54   Mr Brain Wo Contrast  Result Date: 07/03/2016 CLINICAL DATA:  Near syncope. History of migraine headache, hypertension, breast cancer EXAM: MRI HEAD WITHOUT CONTRAST MRA HEAD WITHOUT CONTRAST TECHNIQUE: Multiplanar, multiecho pulse sequences of the brain and surrounding structures were obtained without intravenous contrast. Angiographic images of the head were obtained using MRA technique without contrast. COMPARISON:  CT head 07/02/2016 FINDINGS: MRI HEAD FINDINGS Brain: Acute infarct in the left inferior cerebellum in the left PICA territory. No other acute infarct identified. Cerebral atrophy. Chronic microvascular ischemic changes in the white matter and left anterior basal ganglia. Negative for hemorrhage or mass. Vascular: Normal arterial flow voids. Skull and upper cervical spine: Negative  Sinuses/Orbits: Mild mucosal edema paranasal sinuses. Bilateral cataract extraction. Other: None MRA HEAD FINDINGS Occlusion of the left vertebral artery which may be acute and the cause of the left PICA infarct. There is reconstitution of the distal left vertebral artery. Right vertebral artery is patent. Basilar is diffusely disease with mild stenosis in the midportion. Occlusion of the right posterior cerebral artery. Severe stenosis in the left posterior cerebral artery. Atherosclerotic disease and mild stenosis of the right cavernous carotid. Atherosclerotic disease in the right anterior cerebral artery with moderate stenosis the low right A2 segment. Diffuse disease of the right middle cerebral artery with disease in the right M1 segment at the right middle cerebral artery bifurcation extending into both branches. Left internal carotid artery patent. Mild disease in the left anterior cerebral artery. Severe stenosis in the left M1. Extensive disease distal left M1 segment extending in the proximal MCA branches. 2 x 3 mm infundibulum versus aneurysm of the left posterior cerebral artery origin. IMPRESSION: Acute left PICA infarct. Distal left vertebral artery is occluded in the area of left PICA. Chronic microvascular ischemic change is of a mild-to-moderate degree Extensive intracranial atherosclerotic disease as described above 2 x 3 mm infundibulum versus aneurysm of the left posterior cerebral artery origin. Electronically Signed   By: Franchot Gallo M.D.   On: 07/03/2016 10:59   US Carotid Bilateral  Result Date: 07/03/2016 CLINICAL DATA:  Dizziness, hypertension, syncope EXAM: BILATERAL CAROTID DUPLEX ULTRASOUND TECHNIQUE: Pearline Cables scale imaging, color Doppler and duplex ultrasound were performed of bilateral carotid and vertebral arteries in the neck. COMPARISON:  07/03/2016 brain MRI FINDINGS: Criteria: Quantification of carotid stenosis is based on velocity parameters that correlate the residual  internal carotid diameter with NASCET-based stenosis levels, using the diameter of the distal internal carotid lumen as the denominator for stenosis measurement. The following velocity measurements were obtained: RIGHT ICA:  54/11 cm/sec CCA:  93/8 cm/sec SYSTOLIC ICA/CCA RATIO:  1.01 DIASTOLIC ICA/CCA RATIO:  7.51 ECA:  107 cm/sec LEFT ICA:  95/10 cm/sec CCA:  02/5 cm/sec SYSTOLIC ICA/CCA RATIO:  8.52 DIASTOLIC ICA/CCA RATIO:  7.78 ECA:  105 cm/sec RIGHT CAROTID ARTERY: Minor scattered partially calcified atherosclerosis. right distal ICA is tortuous. No hemodynamically significant right ICA stenosis, velocity elevation, or turbulent flow. Degree of narrowing less than 50%. RIGHT VERTEBRAL ARTERY:  Antegrade LEFT CAROTID ARTERY: Similar scattered minor echogenic plaque formation. No hemodynamically significant left ICA stenosis, velocity elevation, or turbulent flow. LEFT VERTEBRAL ARTERY:  Antegrade IMPRESSION: Minor scattered carotid atherosclerosis. No hemodynamically significant ICA stenosis. Degree of narrowing less than 50% bilaterally. Patent  antegrade vertebral flow bilaterally Electronically Signed   By: Jerilynn Mages.  Shick M.D.   On: 07/03/2016 12:25   Mr Jodene Nam Head/brain QQ Cm  Result Date: 07/03/2016 CLINICAL DATA:  Near syncope. History of migraine headache, hypertension, breast cancer EXAM: MRI HEAD WITHOUT CONTRAST MRA HEAD WITHOUT CONTRAST TECHNIQUE: Multiplanar, multiecho pulse sequences of the brain and surrounding structures were obtained without intravenous contrast. Angiographic images of the head were obtained using MRA technique without contrast. COMPARISON:  CT head 07/02/2016 FINDINGS: MRI HEAD FINDINGS Brain: Acute infarct in the left inferior cerebellum in the left PICA territory. No other acute infarct identified. Cerebral atrophy. Chronic microvascular ischemic changes in the white matter and left anterior basal ganglia. Negative for hemorrhage or mass. Vascular: Normal arterial flow voids.  Skull and upper cervical spine: Negative Sinuses/Orbits: Mild mucosal edema paranasal sinuses. Bilateral cataract extraction. Other: None MRA HEAD FINDINGS Occlusion of the left vertebral artery which may be acute and the cause of the left PICA infarct. There is reconstitution of the distal left vertebral artery. Right vertebral artery is patent. Basilar is diffusely disease with mild stenosis in the midportion. Occlusion of the right posterior cerebral artery. Severe stenosis in the left posterior cerebral artery. Atherosclerotic disease and mild stenosis of the right cavernous carotid. Atherosclerotic disease in the right anterior cerebral artery with moderate stenosis the low right A2 segment. Diffuse disease of the right middle cerebral artery with disease in the right M1 segment at the right middle cerebral artery bifurcation extending into both branches. Left internal carotid artery patent. Mild disease in the left anterior cerebral artery. Severe stenosis in the left M1. Extensive disease distal left M1 segment extending in the proximal MCA branches. 2 x 3 mm infundibulum versus aneurysm of the left posterior cerebral artery origin. IMPRESSION: Acute left PICA infarct. Distal left vertebral artery is occluded in the area of left PICA. Chronic microvascular ischemic change is of a mild-to-moderate degree Extensive intracranial atherosclerotic disease as described above 2 x 3 mm infundibulum versus aneurysm of the left posterior cerebral artery origin. Electronically Signed   By: Franchot Gallo M.D.   On: 07/03/2016 10:59    Assessment: 84 y.o. female presenting with dizziness and left sided weakness.  MRI of the brain reviewed and shows an acute left cerebellar infarct.  Likely secondary to small vessel disease.  Left PICA and left vertebral occluded as well on MRA.  Carotid dopplers show no evidence of hemodynamically significant stenosis.  Echocardiogram pending.  A1c pending, LDL 120.    Stroke Risk  Factors - hyperlipidemia and hypertension  Plan: 1. HgbA1c pending 2. Statin for lipid management with target LDL<70. 3. PT consult, OT consult, Speech consult 4. Echocardiogram 5. Prophylactic therapy-ASA 81mg  and Plavix 75mg  daily 7. NPO until RN stroke swallow screen 8. Telemetry monitoring 9. Frequent neuro checks   Alexis Goodell, MD Neurology [phone] 07/03/2016, 12:51 PM

## 2016-07-03 NOTE — Care Management (Signed)
Admitted to [facility] with the diagnosis of TIA under observation status. Lives alone. Last seen Dr. Ginette Pitman 03/13/16. Daughter is Serrina Minogue [phone]), Prescriptions are filled at [facility]. Grab bars (beside toilet), bedside commode, rolling walker, 4-pronge cane, and Life Alert in the home. Home Health in the past. Doesn't remember name of agency. Daughter will call back with name of agency. 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 No home oxygen. Fell prior to admission. Good appetite. Takes care of all basic activities of daily living herself, drives. Family will transport. Worked in [employer] in the past. Shelbie Ammons RN MSN CCM Care Management

## 2016-07-03 NOTE — Care Management Obs Status (Signed)
[place] NOTIFICATION   Patient Details  Name: AMORETTE CHARRETTE MRN: [id number] Date of Birth: 10-25-1920   Medicare Observation Status Notification Given:  Yes    Shelbie Ammons, RN 07/03/2016, 11:33 AM

## 2016-07-03 NOTE — Progress Notes (Signed)
[place] [facility] [place] NAME: Destiny Neal    MR#:  [id number]  DATE OF BIRTH:  1920/12/02  SUBJECTIVE: Admitted for Sudden onset of dizziness associated with nausea, vomiting, weakness in the legs. Patient didn't have any slurred speech or weakness of one side of the body, double vision, tingling or numbness. no nausea or abdominal pain today. Has dizziness all the time.   CHIEF COMPLAINT:   Chief Complaint  Patient presents with  . Near Syncope    Pt. reports near syncope episode no LOC  . Nausea    Pt. reports nausea and vomiting today.    REVIEW OF SYSTEMS:   ROS CONSTITUTIONAL: No fever, fatigue or weakness.  EYES: No blurred or double vision.  EARS, NOSE, AND THROAT: No tinnitus or ear pain.  RESPIRATORY: No cough, shortness of breath, wheezing or hemoptysis.  CARDIOVASCULAR: No chest pain, orthopnea, edema.  GASTROINTESTINAL: No nausea, vomiting, diarrhea or abdominal pain.  GENITOURINARY: No dysuria, hematuria.  ENDOCRINE: No polyuria, nocturia,  HEMATOLOGY: No anemia, easy bruising or bleeding SKIN: No rash or lesion. MUSCULOSKELETAL: No joint pain or arthritis.   NEUROLOGIC: No tingling, numbness, weakness.  PSYCHIATRY: No anxiety or depression.   DRUG ALLERGIES:   Allergies  Allergen Reactions  . Lincocin [Lincomycin Hcl] Swelling    angioedema  . Codeine     Severe headache  . Hydrocodone Nausea Only and Other (See Comments)    fainted  . Metronidazole Itching    severe  . Penicillins Swelling  . Tramadol Nausea And Vomiting  . Betadine [Povidone Iodine] Rash    Itching  . Iodine Rash    itching    VITALS:  Blood pressure (!) 146/57, pulse 66, temperature 98.3 F (36.8 C), temperature source Oral, resp. rate 20, height 5\' 5"  (1.651 m), weight 79.3 kg (174 lb 12.8 oz), SpO2 96 %.  PHYSICAL EXAMINATION:  GENERAL:  83 y.o.-year-old patient lying in the bed with no acute distress.  EYES: Pupils  equal, round, reactive to light and accommodation. No scleral icterus. Extraocular muscles intact.  HEENT: Head atraumatic, normocephalic. Oropharynx and nasopharynx clear.  NECK:  Supple, no jugular venous distention. No thyroid enlargement, no tenderness.  LUNGS: Normal breath sounds bilaterally, no wheezing, rales,rhonchi or crepitation. No use of accessory muscles of respiration.  CARDIOVASCULAR: S1, S2 normal. No murmurs, rubs, or gallops.  ABDOMEN: Soft, nontender, nondistended. Bowel sounds present. No organomegaly or mass.  EXTREMITIES: No pedal edema, cyanosis, or clubbing.  NEUROLOGIC: Cranial nerves II through XII are intact. Muscle strength 5/5 in all extremities. Sensation intact. Gait not checked.  PSYCHIATRIC: The patient is alert and oriented x 3.  SKIN: No obvious rash, lesion, or ulcer.    LABORATORY PANEL:   CBC  Recent Labs Lab 07/02/16 2202  WBC 12.1*  HGB 15.3  HCT 45.7  PLT 195   ------------------------------------------------------------------------------------------------------------------  Chemistries   Recent Labs Lab 07/02/16 2202  NA 137  K 3.5  CL 103  CO2 26  GLUCOSE 139*  BUN 23*  CREATININE 0.79  CALCIUM 9.4  AST 26  ALT 17  ALKPHOS 64  BILITOT 0.6   ------------------------------------------------------------------------------------------------------------------  Cardiac Enzymes  Recent Labs Lab 07/03/16 0407  TROPONINI <0.03   ------------------------------------------------------------------------------------------------------------------  RADIOLOGY:  Dg Chest 2 View  Result Date: 07/03/2016 CLINICAL DATA:  Transient ischemic attack, emphysema EXAM: CHEST  2 VIEW COMPARISON:  None. FINDINGS: Emphysematous hyperinflation of the lungs with scarring in the left upper lobe. No  pneumonic consolidation or CHF. No pneumothorax or effusion. The heart is top-normal in size. There is aortic atherosclerosis. There is dextroconvex  curvature of the lower thoracic spine. Small to moderate-sized hiatal hernia with air-fluid level is noted. Chronic impacted right humeral neck fracture with healing. IMPRESSION: 1. Emphysematous hyperinflation of lungs with left upper lobe scarring. 2. Chronic fracture of the right surgical neck. 3. Small to moderate-sized hiatal hernia 4. Aortic atherosclerosis Electronically Signed   By: Ashley Royalty M.D.   On: 07/03/2016 00:09   Ct Head Wo Contrast  Result Date: 07/02/2016 CLINICAL DATA:  Near syncopal episode this evening, subsequently vomited. No loss of consciousness. History of migraines, hypertension and breast cancer. EXAM: CT HEAD WITHOUT CONTRAST TECHNIQUE: Contiguous axial images were obtained from the base of the skull through the vertex without intravenous contrast. COMPARISON:  MRI of the head Aug 02, 2015 FINDINGS: BRAIN: No intraparenchymal hemorrhage, mass effect nor midline shift. The ventricles and sulci are normal for age. Patchy supratentorial white matter hypodensities less than expected for patient's age, though non-specific are most compatible with chronic small vessel ischemic disease. No acute large vascular territory infarcts. No abnormal extra-axial fluid collections. Basal cisterns are patent. VASCULAR: Mild calcific atherosclerosis of the carotid siphons. SKULL: No skull fracture. Mild temporomandibular osteoarthrosis. No significant scalp soft tissue swelling. SINUSES/ORBITS: Partially imaged trace LEFT sphenoid sinus air-fluid level. Imaged mastoid air cells are well aerated. The included ocular globes and orbital contents are non-suspicious. OTHER: None. IMPRESSION: Negative CT HEAD for age. Electronically Signed   By: Elon Alas M.D.   On: 07/02/2016 22:54    EKG:   Orders placed or performed during the hospital encounter of 07/02/16  . ED EKG  . ED EKG    ASSESSMENT AND PLAN:   1,sudden onset of dizziness with nausea, vomiting rule out posterior circulation  stroke';continue aspirin. LDL is more than 120: Continue statins. Patient is 83 year old still independent and drives and lives alone. Physical therapy consult. Check MRI and MRA of the brain, echo, ultrasound of carotids. Discussed the plan with patient's daughter.  2. Essential hypertension: Norvasc to be continued with holding parameters'   All the records are reviewed and case discussed with Care Management/Social Workerr. Management plans discussed with the patient, family and they are in agreement.  CODE STATUS:full  TOTAL TIME TAKING CARE OF THIS PATIENT: 35 minutes.   POSSIBLE D/C IN 1-2DAYS, DEPENDING ON CLINICAL CONDITION.   Epifanio Lesches M.D on 07/03/2016 at 9:51 AM  Between 7am to 6pm - Pager - [phone]  After 6pm go to [email or web address] - password [email or web address] [facility]  Office  [phone]  CC: Primary care physician; [facility]   Note: This dictation was prepared with Dragon dictation along with smaller phrase technology. Any transcriptional errors that result from this process are unintentional.

## 2016-07-03 NOTE — Evaluation (Signed)
Speech Language Pathology Screening Patient Details  Name: Destiny Neal MRN: [id number] Date of Birth: 1920-12-14  Today's Date: 07/03/2016 Time:    09:40   SLP Screening:       SLP consulted nursing who states pt does not have any appearance of cognitive linguistic difficulties or swallowing issues.  Pt is able to answer questions and appears oriented appropriately at baseline. No evaluation necessary at this time. NSG educated to notify ST if changes in status occur.   [name] 07/03/2016,11:40 AM

## 2016-07-03 NOTE — Progress Notes (Signed)
OT Cancellation Note  Patient Details Name: Destiny Neal MRN: [id number] DOB: [birth year]   Cancelled Treatment:    Reason Eval/Treat Not Completed: Patient at procedure or test/ unavailable. Pt working with PT upon arrival, OT left handout on energy conservation strategies with pt/daughter. Will re-attempt OT treatment at later date/time as available.  Jeni Salles, MPH, MS, OTR/L ascom [phone] 07/03/16, 2:27 PM

## 2016-07-04 DIAGNOSIS — F329 Major depressive disorder, single episode, unspecified: Secondary | ICD-10-CM | POA: Diagnosis present

## 2016-07-04 DIAGNOSIS — I671 Cerebral aneurysm, nonruptured: Secondary | ICD-10-CM | POA: Diagnosis present

## 2016-07-04 DIAGNOSIS — R29700 NIHSS score 0: Secondary | ICD-10-CM | POA: Diagnosis present

## 2016-07-04 DIAGNOSIS — H409 Unspecified glaucoma: Secondary | ICD-10-CM | POA: Diagnosis present

## 2016-07-04 DIAGNOSIS — Z91048 Other nonmedicinal substance allergy status: Secondary | ICD-10-CM | POA: Diagnosis not present

## 2016-07-04 DIAGNOSIS — B029 Zoster without complications: Secondary | ICD-10-CM | POA: Diagnosis present

## 2016-07-04 DIAGNOSIS — Z961 Presence of intraocular lens: Secondary | ICD-10-CM | POA: Diagnosis present

## 2016-07-04 DIAGNOSIS — Z880 Allergy status to penicillin: Secondary | ICD-10-CM | POA: Diagnosis not present

## 2016-07-04 DIAGNOSIS — Z7982 Long term (current) use of aspirin: Secondary | ICD-10-CM | POA: Diagnosis not present

## 2016-07-04 DIAGNOSIS — Z853 Personal history of malignant neoplasm of breast: Secondary | ICD-10-CM | POA: Diagnosis not present

## 2016-07-04 DIAGNOSIS — Z96642 Presence of left artificial hip joint: Secondary | ICD-10-CM | POA: Diagnosis present

## 2016-07-04 DIAGNOSIS — E039 Hypothyroidism, unspecified: Secondary | ICD-10-CM | POA: Diagnosis present

## 2016-07-04 DIAGNOSIS — R262 Difficulty in walking, not elsewhere classified: Secondary | ICD-10-CM | POA: Diagnosis present

## 2016-07-04 DIAGNOSIS — G459 Transient cerebral ischemic attack, unspecified: Secondary | ICD-10-CM | POA: Diagnosis present

## 2016-07-04 DIAGNOSIS — Z974 Presence of external hearing-aid: Secondary | ICD-10-CM | POA: Diagnosis not present

## 2016-07-04 DIAGNOSIS — I10 Essential (primary) hypertension: Secondary | ICD-10-CM | POA: Diagnosis present

## 2016-07-04 DIAGNOSIS — Z9842 Cataract extraction status, left eye: Secondary | ICD-10-CM | POA: Diagnosis not present

## 2016-07-04 DIAGNOSIS — Z881 Allergy status to other antibiotic agents status: Secondary | ICD-10-CM | POA: Diagnosis not present

## 2016-07-04 DIAGNOSIS — M1611 Unilateral primary osteoarthritis, right hip: Secondary | ICD-10-CM | POA: Diagnosis present

## 2016-07-04 DIAGNOSIS — Z888 Allergy status to other drugs, medicaments and biological substances status: Secondary | ICD-10-CM | POA: Diagnosis not present

## 2016-07-04 DIAGNOSIS — Z9841 Cataract extraction status, right eye: Secondary | ICD-10-CM | POA: Diagnosis not present

## 2016-07-04 DIAGNOSIS — I639 Cerebral infarction, unspecified: Secondary | ICD-10-CM | POA: Diagnosis present

## 2016-07-04 DIAGNOSIS — Z79899 Other long term (current) drug therapy: Secondary | ICD-10-CM | POA: Diagnosis not present

## 2016-07-04 DIAGNOSIS — F419 Anxiety disorder, unspecified: Secondary | ICD-10-CM | POA: Diagnosis present

## 2016-07-04 DIAGNOSIS — Z79891 Long term (current) use of opiate analgesic: Secondary | ICD-10-CM | POA: Diagnosis not present

## 2016-07-04 LAB — ECHOCARDIOGRAM COMPLETE
Height: 65 in
Weight: 2796.8 oz

## 2016-07-04 LAB — HEMOGLOBIN A1C
HEMOGLOBIN A1C: 5.8 % — AB (ref 4.8–5.6)
Mean Plasma Glucose: 120 mg/dL

## 2016-07-04 MED ORDER — AMLODIPINE BESYLATE 5 MG PO TABS
5.0000 mg | ORAL_TABLET | Freq: Every day | ORAL | Status: DC
  Administered 2016-07-04 – 2016-07-06 (×3): 5 mg via ORAL
  Filled 2016-07-04 (×3): qty 1

## 2016-07-04 MED ORDER — ROSUVASTATIN CALCIUM 5 MG PO TABS
5.0000 mg | ORAL_TABLET | Freq: Every day | ORAL | Status: DC
  Administered 2016-07-04 – 2016-07-05 (×2): 5 mg via ORAL
  Filled 2016-07-04 (×2): qty 1

## 2016-07-04 MED ORDER — VALACYCLOVIR HCL 500 MG PO TABS
1000.0000 mg | ORAL_TABLET | Freq: Two times a day (BID) | ORAL | Status: DC
  Administered 2016-07-04 – 2016-07-06 (×5): 1000 mg via ORAL
  Filled 2016-07-04 (×5): qty 2

## 2016-07-04 NOTE — Progress Notes (Addendum)
[name] at [place] NAME: Destiny Neal    MR#:  [id number]  DATE OF BIRTH:  Aug 17, 1920  SUBJECTIVE: Admitted for Sudden onset of dizziness associated with nausea, vomiting, weakness in the legs. Patient didn't have any slurred speech or weakness of one side of the body, double vision, tingling or numbness. no nausea or abdominal pain today. Has dizziness all the time.   CHIEF COMPLAINT:   Chief Complaint  Patient presents with  . Near Syncope    Pt. reports near syncope episode no LOC  . Nausea    Pt. reports nausea and vomiting today.    REVIEW OF SYSTEMS:   ROS CONSTITUTIONAL: No fever, fatigue or weakness.  EYES: No blurred or double vision.  EARS, NOSE, AND THROAT: No tinnitus or ear pain.  RESPIRATORY: No cough, shortness of breath, wheezing or hemoptysis.  CARDIOVASCULAR: No chest pain, orthopnea, edema.  GASTROINTESTINAL: No nausea, vomiting, diarrhea or abdominal pain.  GENITOURINARY: No dysuria, hematuria.  ENDOCRINE: No polyuria, nocturia,  HEMATOLOGY: No anemia, easy bruising or bleeding SKIN: No rash or lesion. MUSCULOSKELETAL: No joint pain or arthritis.   NEUROLOGIC: No tingling, numbness, weakness.  PSYCHIATRY: No anxiety or depression.   DRUG ALLERGIES:   Allergies  Allergen Reactions  . Lincocin [Lincomycin Hcl] Swelling    angioedema  . Codeine     Severe headache  . Hydrocodone Nausea Only and Other (See Comments)    fainted  . Metronidazole Itching    severe  . Penicillins Swelling  . Tramadol Nausea And Vomiting  . Betadine [Povidone Iodine] Rash    Itching  . Iodine Rash    itching    VITALS:  Blood pressure (!) 178/70, pulse 70, temperature 98.2 F (36.8 C), temperature source Oral, resp. rate 18, height 5\' 5"  (1.651 m), weight 79.3 kg (174 lb 12.8 oz), SpO2 94 %.  PHYSICAL EXAMINATION:  GENERAL:  83 y.o.-year-old patient lying in the bed with no acute distress.  EYES: Pupils  equal, round, reactive to light and accommodation. No scleral icterus. Extraocular muscles intact.  HEENT: Head atraumatic, normocephalic. Oropharynx and nasopharynx clear.  NECK:  Supple, no jugular venous distention. No thyroid enlargement, no tenderness.  LUNGS: Normal breath sounds bilaterally, no wheezing, rales,rhonchi or crepitation. No use of accessory muscles of respiration.  CARDIOVASCULAR: S1, S2 normal. No murmurs, rubs, or gallops.  ABDOMEN: Soft, nontender, nondistended. Bowel sounds present. No organomegaly or mass.  EXTREMITIES: No pedal edema, cyanosis, or clubbing.  NEUROLOGIC: Cranial nerves II through XII are intact. Muscle strength 5/5 in all extremities. Sensation intact. Gait not checked.  PSYCHIATRIC: The patient is alert and oriented x 3.  SKIN: No obvious rash, lesion, or ulcer.    LABORATORY PANEL:   CBC  Recent Labs Lab 07/02/16 2202  WBC 12.1*  HGB 15.3  HCT 45.7  PLT 195   ------------------------------------------------------------------------------------------------------------------  Chemistries   Recent Labs Lab 07/02/16 2202  NA 137  K 3.5  CL 103  CO2 26  GLUCOSE 139*  BUN 23*  CREATININE 0.79  CALCIUM 9.4  AST 26  ALT 17  ALKPHOS 64  BILITOT 0.6   ------------------------------------------------------------------------------------------------------------------  Cardiac Enzymes  Recent Labs Lab 07/03/16 1811  TROPONINI <0.03   ------------------------------------------------------------------------------------------------------------------  RADIOLOGY:  Dg Chest 2 View  Result Date: 07/03/2016 CLINICAL DATA:  Transient ischemic attack, emphysema EXAM: CHEST  2 VIEW COMPARISON:  None. FINDINGS: Emphysematous hyperinflation of the lungs with scarring in the left upper lobe. No  pneumonic consolidation or CHF. No pneumothorax or effusion. The heart is top-normal in size. There is aortic atherosclerosis. There is dextroconvex  curvature of the lower thoracic spine. Small to moderate-sized hiatal hernia with air-fluid level is noted. Chronic impacted right humeral neck fracture with healing. IMPRESSION: 1. Emphysematous hyperinflation of lungs with left upper lobe scarring. 2. Chronic fracture of the right surgical neck. 3. Small to moderate-sized hiatal hernia 4. Aortic atherosclerosis Electronically Signed   By: Ashley Royalty M.D.   On: 07/03/2016 00:09   Ct Head Wo Contrast  Result Date: 07/02/2016 CLINICAL DATA:  Near syncopal episode this evening, subsequently vomited. No loss of consciousness. History of migraines, hypertension and breast cancer. EXAM: CT HEAD WITHOUT CONTRAST TECHNIQUE: Contiguous axial images were obtained from the base of the skull through the vertex without intravenous contrast. COMPARISON:  MRI of the head Aug 02, 2015 FINDINGS: BRAIN: No intraparenchymal hemorrhage, mass effect nor midline shift. The ventricles and sulci are normal for age. Patchy supratentorial white matter hypodensities less than expected for patient's age, though non-specific are most compatible with chronic small vessel ischemic disease. No acute large vascular territory infarcts. No abnormal extra-axial fluid collections. Basal cisterns are patent. VASCULAR: Mild calcific atherosclerosis of the carotid siphons. SKULL: No skull fracture. Mild temporomandibular osteoarthrosis. No significant scalp soft tissue swelling. SINUSES/ORBITS: Partially imaged trace LEFT sphenoid sinus air-fluid level. Imaged mastoid air cells are well aerated. The included ocular globes and orbital contents are non-suspicious. OTHER: None. IMPRESSION: Negative CT HEAD for age. Electronically Signed   By: Elon Alas M.D.   On: 07/02/2016 22:54   Mr Brain Wo Contrast  Result Date: 07/03/2016 CLINICAL DATA:  Near syncope. History of migraine headache, hypertension, breast cancer EXAM: MRI HEAD WITHOUT CONTRAST MRA HEAD WITHOUT CONTRAST TECHNIQUE: Multiplanar,  multiecho pulse sequences of the brain and surrounding structures were obtained without intravenous contrast. Angiographic images of the head were obtained using MRA technique without contrast. COMPARISON:  CT head 07/02/2016 FINDINGS: MRI HEAD FINDINGS Brain: Acute infarct in the left inferior cerebellum in the left PICA territory. No other acute infarct identified. Cerebral atrophy. Chronic microvascular ischemic changes in the white matter and left anterior basal ganglia. Negative for hemorrhage or mass. Vascular: Normal arterial flow voids. Skull and upper cervical spine: Negative Sinuses/Orbits: Mild mucosal edema paranasal sinuses. Bilateral cataract extraction. Other: None MRA HEAD FINDINGS Occlusion of the left vertebral artery which may be acute and the cause of the left PICA infarct. There is reconstitution of the distal left vertebral artery. Right vertebral artery is patent. Basilar is diffusely disease with mild stenosis in the midportion. Occlusion of the right posterior cerebral artery. Severe stenosis in the left posterior cerebral artery. Atherosclerotic disease and mild stenosis of the right cavernous carotid. Atherosclerotic disease in the right anterior cerebral artery with moderate stenosis the low right A2 segment. Diffuse disease of the right middle cerebral artery with disease in the right M1 segment at the right middle cerebral artery bifurcation extending into both branches. Left internal carotid artery patent. Mild disease in the left anterior cerebral artery. Severe stenosis in the left M1. Extensive disease distal left M1 segment extending in the proximal MCA branches. 2 x 3 mm infundibulum versus aneurysm of the left posterior cerebral artery origin. IMPRESSION: Acute left PICA infarct. Distal left vertebral artery is occluded in the area of left PICA. Chronic microvascular ischemic change is of a mild-to-moderate degree Extensive intracranial atherosclerotic disease as described above  2 x 3 mm infundibulum versus  aneurysm of the left posterior cerebral artery origin. Electronically Signed   By: Franchot Gallo M.D.   On: 07/03/2016 10:59   US Carotid Bilateral  Result Date: 07/03/2016 CLINICAL DATA:  Dizziness, hypertension, syncope EXAM: BILATERAL CAROTID DUPLEX ULTRASOUND TECHNIQUE: Pearline Cables scale imaging, color Doppler and duplex ultrasound were performed of bilateral carotid and vertebral arteries in the neck. COMPARISON:  07/03/2016 brain MRI FINDINGS: Criteria: Quantification of carotid stenosis is based on velocity parameters that correlate the residual internal carotid diameter with NASCET-based stenosis levels, using the diameter of the distal internal carotid lumen as the denominator for stenosis measurement. The following velocity measurements were obtained: RIGHT ICA:  54/11 cm/sec CCA:  29/5 cm/sec SYSTOLIC ICA/CCA RATIO:  2.84 DIASTOLIC ICA/CCA RATIO:  1.32 ECA:  107 cm/sec LEFT ICA:  95/10 cm/sec CCA:  44/0 cm/sec SYSTOLIC ICA/CCA RATIO:  1.02 DIASTOLIC ICA/CCA RATIO:  7.25 ECA:  105 cm/sec RIGHT CAROTID ARTERY: Minor scattered partially calcified atherosclerosis. right distal ICA is tortuous. No hemodynamically significant right ICA stenosis, velocity elevation, or turbulent flow. Degree of narrowing less than 50%. RIGHT VERTEBRAL ARTERY:  Antegrade LEFT CAROTID ARTERY: Similar scattered minor echogenic plaque formation. No hemodynamically significant left ICA stenosis, velocity elevation, or turbulent flow. LEFT VERTEBRAL ARTERY:  Antegrade IMPRESSION: Minor scattered carotid atherosclerosis. No hemodynamically significant ICA stenosis. Degree of narrowing less than 50% bilaterally. Patent antegrade vertebral flow bilaterally Electronically Signed   By: Jerilynn Mages.  Shick M.D.   On: 07/03/2016 12:25   Mr Jodene Nam Head/brain DG Cm  Result Date: 07/03/2016 CLINICAL DATA:  Near syncope. History of migraine headache, hypertension, breast cancer EXAM: MRI HEAD WITHOUT CONTRAST MRA HEAD WITHOUT  CONTRAST TECHNIQUE: Multiplanar, multiecho pulse sequences of the brain and surrounding structures were obtained without intravenous contrast. Angiographic images of the head were obtained using MRA technique without contrast. COMPARISON:  CT head 07/02/2016 FINDINGS: MRI HEAD FINDINGS Brain: Acute infarct in the left inferior cerebellum in the left PICA territory. No other acute infarct identified. Cerebral atrophy. Chronic microvascular ischemic changes in the white matter and left anterior basal ganglia. Negative for hemorrhage or mass. Vascular: Normal arterial flow voids. Skull and upper cervical spine: Negative Sinuses/Orbits: Mild mucosal edema paranasal sinuses. Bilateral cataract extraction. Other: None MRA HEAD FINDINGS Occlusion of the left vertebral artery which may be acute and the cause of the left PICA infarct. There is reconstitution of the distal left vertebral artery. Right vertebral artery is patent. Basilar is diffusely disease with mild stenosis in the midportion. Occlusion of the right posterior cerebral artery. Severe stenosis in the left posterior cerebral artery. Atherosclerotic disease and mild stenosis of the right cavernous carotid. Atherosclerotic disease in the right anterior cerebral artery with moderate stenosis the low right A2 segment. Diffuse disease of the right middle cerebral artery with disease in the right M1 segment at the right middle cerebral artery bifurcation extending into both branches. Left internal carotid artery patent. Mild disease in the left anterior cerebral artery. Severe stenosis in the left M1. Extensive disease distal left M1 segment extending in the proximal MCA branches. 2 x 3 mm infundibulum versus aneurysm of the left posterior cerebral artery origin. IMPRESSION: Acute left PICA infarct. Distal left vertebral artery is occluded in the area of left PICA. Chronic microvascular ischemic change is of a mild-to-moderate degree Extensive intracranial  atherosclerotic disease as described above 2 x 3 mm infundibulum versus aneurysm of the left posterior cerebral artery origin. Electronically Signed   By: Franchot Gallo M.D.   On: 07/03/2016  10:59    EKG:   Orders placed or performed during the hospital encounter of 07/02/16  . ED EKG  . ED EKG    ASSESSMENT AND PLAN:   1,sudden onset of dizziness with nausea, vomiting due to acute  Left PICA stroke;;;Added aspirin, Plavix, statins, physical therapy recommended skilled nursing. Echocardiogram pending. Ultrasound of carotids not show any hemodynamically significant stenosis. Patient still is feeling nauseous, poor by mouth intake so continue IV hydration. Discussed the plan with patient's daughter.  2. 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 A description of standard precautions is presented elsewhere. (See "Infection prevention: Precautions for preventing transmission of infection", section on 'Standard precautions'.) Suspected airborne or droplet transmission has been reported which suggests that covering herpes zoster lesions with gauze may sometimes be inadequate to prevent aerosolization of varicella virus in immunocompetent hosts [10,11,13,14,16]. However, we support the current CDC recommendations, as only a few reported infections have occurred when CDC recommendations were followed. (See   . All the records are reviewed and case discussed with Care Management/Social  Workerr. Management plans discussed with the patient, family and they are in agreement.  CODE STATUS:full  TOTAL TIME TAKING CARE OF THIS PATIENT: 35 minutes.   POSSIBLE D/C IN 1-2DAYS, DEPENDING ON CLINICAL CONDITION.   Epifanio Lesches M.D on 07/04/2016 at 8:38 AM  Between 7am to 6pm - Pager - [phone]  After 6pm go to [email or web address] - password [email or web address] [place] [facility]  Office  [phone]  CC: Primary care physician; [facility]   Note: This dictation was prepared with Dragon dictation along with smaller phrase technology. Any transcriptional errors that result from this process are unintentional.

## 2016-07-04 NOTE — Progress Notes (Signed)
[name] rash to upper right arm confirmed to be shingles by Dr. Vianne Bulls.  This nurse asked Dr. Vianne Bulls if pt needed to be moved to airborne isolation room and Dr. Vianne Bulls stated that pt did not need isolation due to shingles being crusted over.  Clarise Cruz, RN

## 2016-07-04 NOTE — Progress Notes (Signed)
Physical Therapy Treatment Patient Details Name: Destiny Neal MRN: [id number] DOB: 06-22-1920 Today's Date: 07/04/2016    History of Present Illness Pt is a 83 yo female pt who presented to ED on 4/1 w/ dizziness. PMHx includes arthritis (hips), breast cancer 2009, depression, HTN, hypothyroidism, migraines (none in past 8-20yrs), and RUE fx (2015). 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    PT Comments    Pt in bed with family in room upon entry. Pt very pleasant and agreeable to PT treatment. Pt reported no dizziness lying in bed and anxious/impulsive to sit EOB. Pt was modified independent for bed mobility and CGA to min assist with transfers and ambulation. Pt reported unchanging dizziness throughout session after sitting EOB. Pt exhibits R trunk flexion in sitting and standing. Pt  presented with L eye scanning that was mildly saccadic and impaired convergence with visual tracking. [name] dizziness improved at end of session after VOR x1 exercise. Pt will address gaze stabilization to address dizziness, increase strength (particularly in L trunk muscles in sitting and standing), and increase activity tolerance with ambulation during next session. Recommendation remains that pt d/c to SNF d/t limited physical assistance at home, pt's weakness, pt's decreased balance, and pt's decreased activity tolerance.      Follow Up Recommendations  SNF     Equipment Recommendations  Rolling walker with 5" wheels    Recommendations for Other Services       Precautions / Restrictions Precautions Precautions: Fall Precaution Comments: Seizures, aspiration Restrictions Weight Bearing Restrictions: No    Mobility  Bed Mobility Overal bed mobility: Modified Independent (pt used bed rails and HOB elevated.) Bed Mobility: Supine to Sit;Sit to Supine     Supine to sit: HOB elevated;Min assist Sit to supine: Modified independent  (Device/Increase time);HOB elevated   General bed mobility comments: Pt used bed rails and a HHA to sit EOB and did not reuire assistence to lay back down, but required increased effort and time; pt reported mild increase in dizziness with sitting from supine that did not change with time.  Transfers Overall transfer level: Needs assistance Equipment used: Rolling walker (2 wheeled) Transfers: Sit to/from Stand Sit to Stand: Min guard (for safety.)         General transfer comment: Pt anxious to stand almost impulsive d/t standing before giving pt cues to stand; pt reported no change in dizziness with standing.   Ambulation/Gait Ambulation/Gait assistance: Min assist Ambulation Distance (Feet): 80 Feet Assistive device: Rolling walker (2 wheeled)     Gait velocity interpretation: Below normal speed for age/gender General Gait Details: Pt ambulated with R trunk flexion, decreased B step length, and pt drifted to the left requiring physical assistance to redirect RW; pt reported no change in dizziness with ambulation.    Stairs            Wheelchair Mobility    Modified Rankin (Stroke Patients Only)       Balance Overall balance assessment: Needs assistance Sitting-balance support: No upper extremity supported;Feet supported Sitting balance-Leahy Scale: Good Sitting balance - Comments: Pt sitting with R trunk flexion and increased weight shift on the L hip.   Standing balance support: Bilateral upper extremity supported Standing balance-Leahy Scale: Fair                              Cognition Arousal/Alertness: Awake/alert Behavior During Therapy: WFL for tasks  assessed/performed Overall Cognitive Status: Within Functional Limits for tasks assessed                                        Exercises Other Exercises Other Exercises: VOR x1 with horizontal head turns; 1 min; x1 (Pt reported decreased dizziness with exercise.)     General Comments General comments (skin integrity, edema, etc.): [name] family present during session.      Pertinent Vitals/Pain Pain Assessment: No/denies pain    Home Living                      Prior Function            PT Goals (current goals can now be found in the care plan section) Acute Rehab PT Goals Patient Stated Goal: go home PT Goal Formulation: With patient Time For Goal Achievement: 07/17/16 Potential to Achieve Goals: Fair Additional Goals Additional Goal #1: Perform objective balance assessment. Progress towards PT goals: Progressing toward goals    Frequency    7X/week      PT Plan Current plan remains appropriate    Co-evaluation             End of Session Equipment Utilized During Treatment: Gait belt Activity Tolerance: Patient limited by fatigue;Patient tolerated treatment well (Pt fatigued during ambulation, asking to turn around and go back to bed.) Patient left: in bed;with call bell/phone within reach;with bed alarm set;with family/visitor present Nurse Communication: Mobility status;Precautions (exercise prescription via white board.) PT Visit Diagnosis: Difficulty in walking, not elsewhere classified (R26.2);Dizziness and giddiness (R42)     Time: 9629-5284 PT Time Calculation (min) (ACUTE ONLY): 26 min  Charges:                       G Codes:         Matika Bartell, SPT 07/04/2016, 3:03 PM

## 2016-07-04 NOTE — Progress Notes (Signed)
Suspected shingles in the right arm, dried and crusted with no active drainage. Spoke with Destiny Neal wall.no isolation recommended,. Airborne , contact isolation only if the lesions are draining.

## 2016-07-05 LAB — CBC
HEMATOCRIT: 42.4 % (ref 35.0–47.0)
Hemoglobin: 14.5 g/dL (ref 12.0–16.0)
MCH: 31.6 pg (ref 26.0–34.0)
MCHC: 34.2 g/dL (ref 32.0–36.0)
MCV: 92.2 fL (ref 80.0–100.0)
Platelets: 213 10*3/uL (ref 150–440)
RBC: 4.59 MIL/uL (ref 3.80–5.20)
RDW: 13.2 % (ref 11.5–14.5)
WBC: 8.3 10*3/uL (ref 3.6–11.0)

## 2016-07-05 LAB — CREATININE, SERUM
Creatinine, Ser: 0.82 mg/dL (ref 0.44–1.00)
GFR calc non Af Amer: 60 mL/min — ABNORMAL LOW (ref >60)

## 2016-07-05 MED ORDER — HYDRALAZINE HCL 25 MG PO TABS: 25.0000 mg | ORAL_TABLET | Freq: Three times a day (TID) | ORAL | 0 refills | Status: DC

## 2016-07-05 MED ORDER — AMLODIPINE BESYLATE 5 MG PO TABS: 5.0000 mg | ORAL_TABLET | Freq: Every day | ORAL | 0 refills | Status: AC

## 2016-07-05 MED ORDER — VALACYCLOVIR HCL 1 G PO TABS: 1000.0000 mg | ORAL_TABLET | Freq: Two times a day (BID) | ORAL | 0 refills | Status: DC

## 2016-07-05 MED ORDER — CLOPIDOGREL BISULFATE 75 MG PO TABS: 75.0000 mg | ORAL_TABLET | Freq: Every day | ORAL | 0 refills | Status: AC

## 2016-07-05 MED ORDER — ROSUVASTATIN CALCIUM 5 MG PO TABS: 5.0000 mg | ORAL_TABLET | Freq: Every day | ORAL | 0 refills | Status: AC

## 2016-07-05 NOTE — NC FL2 (Signed)
[place] LEVEL OF CARE SCREENING TOOL     IDENTIFICATION  Patient Name: Destiny Neal Birthdate: 01/23/1921 Sex: female Admission Date (Current Location): 07/02/2016  [place] and [place] Number:  Engineering geologist and Address:  [facility], [address] [place], [place] [address]      Provider Number: [id number]  Attending Physician Name and Address:  Epifanio Lesches, MD  Relative Name and Phone Number:       Current Level of Care: Hospital Recommended Level of Care: [place] Prior Approval Number:    Date Approved/Denied:   PASRR Number:    Discharge Plan: SNF    Current Diagnoses: Patient Active Problem List   Diagnosis Date Noted  . CVA (cerebral vascular accident) ([place]) 07/04/2016  . TIA (transient ischemic attack) 07/02/2016    Orientation RESPIRATION BLADDER Height & Weight     Self, Time, Situation, Place  Normal Continent Weight: 174 lb 12.8 oz (79.3 kg) Height:  5\' 5"  (165.1 cm)  BEHAVIORAL SYMPTOMS/MOOD NEUROLOGICAL BOWEL NUTRITION STATUS      Continent Diet (Heart Healthy, Thin Liquids)  AMBULATORY STATUS COMMUNICATION OF NEEDS Skin   Limited Assist Verbally Normal                       Personal Care Assistance Level of Assistance  Bathing, Feeding, Dressing Bathing Assistance: Limited assistance Feeding assistance: Independent Dressing Assistance: Limited assistance     Functional Limitations Info  Sight, Hearing, Speech Sight Info: Adequate Hearing Info: Adequate Speech Info: Adequate    SPECIAL CARE FACTORS FREQUENCY  PT (By licensed PT)     PT Frequency: 5              Contractures Contractures Info: Not present    Additional Factors Info  Code Status, Allergies, Psychotropic Code Status Info: Full Code Allergies Info: Lincocin Lincomycin Hcl, Codeine, Hydrocodone, Metronidazole, Penicillins, Tramadol, Betadine Povidone Iodine, Iodine Psychotropic Info:  Medications:  Paxil         Current Medications (07/05/2016):  This is the current hospital active medication list Current Facility-Administered Medications  Medication Dose Route Frequency Provider Last Rate Last Dose  . acetaminophen (TYLENOL) tablet 650 mg  650 mg Oral Q4H PRN Alexis Hugelmeyer, DO   650 mg at 07/03/16 1404   Or  . acetaminophen (TYLENOL) solution 650 mg  650 mg Per Tube Q4H PRN Alexis Hugelmeyer, DO       Or  . acetaminophen (TYLENOL) suppository 650 mg  650 mg Rectal Q4H PRN Alexis Hugelmeyer, DO      . amLODipine (NORVASC) tablet 5 mg  5 mg Oral Daily Epifanio Lesches, MD   5 mg at 07/05/16 1123  . aspirin EC tablet 81 mg  81 mg Oral Daily Alexis Hugelmeyer, DO   81 mg at 07/05/16 1123  . calcium-vitamin D (OSCAL WITH D) 500-200 MG-UNIT per tablet 1 tablet  1 tablet Oral Q breakfast Alexis Hugelmeyer, DO   1 tablet at 07/05/16 1123  . clopidogrel (PLAVIX) tablet 75 mg  75 mg Oral Daily Alexis Goodell, MD   75 mg at 07/05/16 1124  . dorzolamidel-timolol (COSOPT) 22.3-6.8 MG/ML ophthalmic solution SOLN 1 drop  1 drop Left Eye BID Epifanio Lesches, MD   1 drop at 07/04/16 2035  . enoxaparin (LOVENOX) injection 40 mg  40 mg Subcutaneous Q24H Alexis Hugelmeyer, DO   40 mg at 07/04/16 2035  . latanoprost (XALATAN) 0.005 % ophthalmic solution 1 drop  1 drop Both Eyes QHS Alexis Hugelmeyer, DO   1 drop at 07/04/16 2035  . levothyroxine (SYNTHROID, LEVOTHROID) tablet 25 mcg  25 mcg Oral QAC breakfast Alexis Hugelmeyer, DO   25 mcg at 07/05/16 1125  . multivitamin with minerals tablet 1 tablet  1 tablet Oral Daily Alexis Hugelmeyer, DO   1 tablet at 07/05/16 1124  . PARoxetine (PAXIL) tablet 5 mg  5 mg Oral Daily Alexis Hugelmeyer, DO   5 mg at 07/05/16 1124  . rosuvastatin (CRESTOR) tablet 5 mg  5 mg Oral q1800 Epifanio Lesches, MD   5 mg at 07/05/16 1656  . senna-docusate (Senokot-S) tablet 1 tablet  1 tablet Oral QHS PRN Alexis Hugelmeyer, DO      . valACYclovir  (VALTREX) tablet 1,000 mg  1,000 mg Oral BID Epifanio Lesches, MD   1,000 mg at 07/05/16 1123  . vitamin B-12 (CYANOCOBALAMIN) tablet 2,000 mcg  2,000 mcg Oral Daily Alexis Hugelmeyer, DO   2,000 mcg at 07/05/16 1124     Discharge Medications: Please see discharge summary for a list of discharge medications.  Relevant Imaging Results:  Relevant Lab Results:   Additional Information SSN:  [id number]  Darden Dates, LCSW

## 2016-07-05 NOTE — Progress Notes (Signed)
Pt stable for discharge

## 2016-07-05 NOTE — Progress Notes (Signed)
Physical Therapy Treatment Patient Details Name: Destiny Neal MRN: [id number] DOB: 02-Mar-1921 Today's Date: 07/05/2016    History of Present Illness Pt is a 83 yo female pt who presented to ED on 4/1 w/ dizziness. PMHx includes arthritis (hips), breast cancer 2009, depression, HTN, hypothyroidism, migraines (none in past 8-24yrs), and RUE fx (2015). 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    PT Comments    Pt found in bed talking with her son and daughter upon entry. Pt very pleasant and agreeable to PT treatment. Pt reported that she was not experiencing any dizziness laying in bed at the beginning of session. Pt required supervision with bed mobility for safety and CGA to min for transfers. Pt experienced mild dizziness upon sitting EOB that only resolved after gaze stabilization exercises were performed when she reported that she no longer felt dizzy. Pt experienced mild dizziness again when standing that did not resolve, but improved after gaze stabilization exercises were performed. Pt required CGA to min assist for basic standing balance exercises with RW available for support. Pt posture is noted to be elongated on the left side of the trunk, improved with sitting and standing interventions and v/c's. PT will continue to work on gaze stabilization to address pt's dizziness, increase level of difficulty of balance exercises for improved balance, and increase ambulation distance during next session. Recommendation for pt to d/c to SNF remains appropriate d/t pts limited activity tolerance, decreased balance, and dizziness.       Follow Up Recommendations  SNF     Equipment Recommendations  Rolling walker with 5" wheels    Recommendations for Other Services       Precautions / Restrictions Precautions Precautions: Fall Precaution Comments: Seizures, aspiration Restrictions Weight Bearing Restrictions: No    Mobility  Bed  Mobility Overal bed mobility: Needs Assistance (pt used bed rails with HOB elevated) Bed Mobility: Supine to Sit;Sit to Supine     Supine to sit: Supervision;HOB elevated Sit to supine: Supervision;HOB elevated   General bed mobility comments: Pt required use of bed rails with HOB elevated for all bed mobility; Pt required mod assist of 2 to slide up in bed at end of session; pt reported mild increase in dizziness with sitting from supine that did not change after ~2 min sitting EOB.  Transfers Overall transfer level: Needs assistance Equipment used: Rolling walker (2 wheeled) Transfers: Sit to/from Stand Sit to Stand: Min guard         General transfer comment: Pt followed cues to stand better today, pt waited for therapist to cue her to stand before standing; pt reported mild dizziness with standing that did not go away in standing; v/c's provided for pt to pull her L shoulder down toward her hip until she reached neutral to correct L lateral trunk elongation in standing posture.  Ambulation/Gait             General Gait Details: Pt did not ambulate today d/t pt fatigue; focus of PT treatment was on gaze stabilization and balance exercises and pt state she was 'very tired' after these exercises.   Stairs            Wheelchair Mobility    Modified Rankin (Stroke Patients Only)       Balance Overall balance assessment: Needs assistance Sitting-balance support: No upper extremity supported;Feet supported Sitting balance-Leahy Scale: Good Sitting balance - Comments: Pt sitting with L trunk elongated causing R trunk  flexion and weight shift into L hip; placed folded blanket under pt's L hip to activate L lateral trunk muscles and promote normal sitting posture.    Standing balance support: Single extremity supported;During functional activity Standing balance-Leahy Scale: Fair Standing balance comment: Pt stood w/o UE supported with feet shoulder width apart for >10s  with eyes open and with eyes closed; Pt stood w/o UE supported with feet together for 10 seconds with eyes open with notibly wavering and for 2 second with eyes closed; pt performed reaching to the low left diagonal with the L arm and high right diabonal with the right arm to touch therapist's hand with requiring increasing reach with each attempt challenging balance and working to strengthen L lateral turnk muscles; pt tolerated balance exercises well with minimal LOB with RW accessable and CGA to min assist from therapist.                            Cognition Arousal/Alertness: Awake/alert Behavior During Therapy: WFL for tasks assessed/performed Overall Cognitive Status: Within Functional Limits for tasks assessed                                        Exercises Other Exercises Other Exercises: Gaze stabilization exercises: VOR x1 with horizontal head movements; 1 min; x1; in sitting and standing (Pt reported decrease in dizziness after 1 trial in sitting; pt reported no change in dizziness in standing and had increased difficulty maintaning gaze compaired to sitting.) Other Exercises: Gaze stabilization exercises: VOR x1 with vertical head movements; 1 min; x1; in sitting and standing (Pt reported no dizziness after 1 trial in sitting; pt reported no change in dizziness after 1 trial in standing and had increased difficulty maintaning gaze compared to sitting.)    General Comments        Pertinent Vitals/Pain Pain Assessment: No/denies pain    Home Living                      Prior Function            PT Goals (current goals can now be found in the care plan section) Acute Rehab PT Goals Patient Stated Goal: go home PT Goal Formulation: With patient Time For Goal Achievement: 07/17/16 Potential to Achieve Goals: Fair Additional Goals Additional Goal #1: Perform objective balance assessment. Progress towards PT goals: Progressing toward  goals    Frequency    7X/week      PT Plan Current plan remains appropriate    Co-evaluation             End of Session Equipment Utilized During Treatment: Gait belt Activity Tolerance: Patient limited by fatigue;Patient tolerated treatment well Patient left: in bed;with call bell/phone within reach;with bed alarm set;with family/visitor present ([name] son and daughter present for full treatment session.) Nurse Communication:  (Exercise prescription via white board.) PT Visit Diagnosis: Difficulty in walking, not elsewhere classified (R26.2);Dizziness and giddiness (R42)     Time: 3159-4585 PT Time Calculation (min) (ACUTE ONLY): 24 min  Charges:                       G Codes:         Orrie Lascano, SPT 07/05/2016, 1:46 PM

## 2016-07-05 NOTE — Discharge Summary (Addendum)
Destiny Neal, is a 83 y.o. female  DOB Apr 28, 1920  MRN [id number].  Admission date:  07/02/2016  Admitting Physician  Harvie Bridge, DO  Discharge Date:  07/06/2016   Primary MD  [facility]  Recommendations for primary care physician for things to follow:    follow Up with primary doctor Dr. Ginette Pitman in one week.,   Admission Diagnosis  TIA (transient ischemic attack) [G45.9] Vertigo [R42] Hypothermia not due to cold exposure [R68.0]   Discharge Diagnosis  TIA (transient ischemic attack) [G45.9] Vertigo [R42] Hypothermia not due to cold exposure [R68.0]    Active Problems:   TIA (transient ischemic attack)   CVA (cerebral vascular accident) [facility])      Past Medical History:  Diagnosis Date  . Arthritis    hips  . Breast cancer ([place])   . Depression   . Hypertension   . Hypothyroidism   . Migraines    none in 8-10 yrs  . Wears hearing aid    bilateral    Past Surgical History:  Procedure Laterality Date  . BREAST LUMPECTOMY    . CATARACT EXTRACTION W/PHACO Left 06/21/2015   Procedure: CATARACT EXTRACTION PHACO AND INTRAOCULAR LENS PLACEMENT (IOC);  Surgeon: Ronnell Freshwater, MD;  Location: [place];  Service: Ophthalmology;  Laterality: Left;  . CATARACT EXTRACTION W/PHACO Right 07/12/2015   Procedure: CATARACT EXTRACTION PHACO AND INTRAOCULAR LENS PLACEMENT ([place]) right;  Surgeon: Ronnell Freshwater, MD;  Location: [place];  Service: Ophthalmology;  Laterality: Right;  . JOINT REPLACEMENT Left    total hip       History of present illness and  Hospital Course:     Kindly see H&P for history of present illness and admission details, please review complete Labs, Consult reports and Test reports for all details in brief  HPI  from the history and  physical done on the day of admission 83 year old female patient admitted for sudden onset of dizziness,  Off balance,   Hospital Course  #1 dizziness, unstable gait secondary to acute stroke left PICA/cerebellar.; Neurology, patient started on aspirin, Plavix, statins, physical therapy recommended skilled nursing. Patient is medically stable for discharge today. Continue aspirin, Plavix, statins. Her echocardiogram showed EF more than 65%, ultrasound of carotids showed no hemodynamically significant stenosis.  #2 essential hypertension: Initially allowed permissive hypertension but the blood pressure is up at 170/80, increase the Norvasc to 5 mg, added hydralazine.  #3 suspected shingles in the right arm: Started on Valtrex 1 g by mouth twice a day, patient finished 3 day so far. Continue for 4 more days and finish the course. The patient had dry /crusted lesion with out discharge. No need for isolation    Discharge Condition:  stable   Allergies as of 07/06/2016      Reactions   Lincocin [lincomycin Hcl] Swelling   angioedema   Codeine    Severe headache   Hydrocodone Nausea Only, Other (See Comments)   fainted   Metronidazole Itching   severe   Penicillins Swelling   Tramadol Nausea And Vomiting   Betadine [povidone Iodine] Rash   Itching   Iodine Rash   itching      Medication List    TAKE these medications   amLODipine 5 MG tablet Commonly known as:  NORVASC Take 1 tablet (5 mg total) by mouth daily. What changed:  medication strength  how much to take   aspirin 81 MG tablet Take 81 mg by mouth  daily.   CALCIUM + D PO Take 1 tablet by mouth 2 (two) times daily.   clopidogrel 75 MG tablet Commonly known as:  PLAVIX Take 1 tablet (75 mg total) by mouth daily.   docusate sodium 100 MG capsule Commonly known as:  COLACE Take 1 capsule (100 mg total) by mouth daily.   dorzolamide-timolol 22.3-6.8 MG/ML ophthalmic solution Commonly known as:  COSOPT Place 1  drop into both eyes 2 (two) times daily.   GRAPESEED EXTRACT PO Take 100 mg by mouth daily.   hydrALAZINE 25 MG tablet Commonly known as:  APRESOLINE Take 1 tablet (25 mg total) by mouth 3 (three) times daily.   latanoprost 0.005 % ophthalmic solution Commonly known as:  XALATAN Place 1 drop into both eyes at bedtime.   levothyroxine 25 MCG tablet Commonly known as:  SYNTHROID, LEVOTHROID Take 25 mcg by mouth daily before breakfast.   multivitamin capsule Take 1 capsule by mouth daily.   PARoxetine 10 MG tablet Commonly known as:  PAXIL Take 5 mg by mouth daily.   rosuvastatin 5 MG tablet Commonly known as:  CRESTOR Take 1 tablet (5 mg total) by mouth daily at 6 PM.   valACYclovir 1000 MG tablet Commonly known as:  VALTREX Take 1 tablet (1,000 mg total) by mouth 2 (two) times daily.   vitamin B-12 1000 MCG tablet Commonly known as:  CYANOCOBALAMIN Take 2,000 mcg by mouth daily.   B-12 IJ Inject 1,000 Units as directed every 30 (thirty) days.         Diet and Activity recommendation Heart healthy diet  Activity as tolerated with assistance   Consults obtained - neuro,PT,   Major procedures and Radiology Reports - PLEASE review detailed and final reports for all details, in brief -      Dg Chest 2 View  Result Date: 07/03/2016 CLINICAL DATA:  Transient ischemic attack, emphysema EXAM: CHEST  2 VIEW COMPARISON:  None. FINDINGS: Emphysematous hyperinflation of the lungs with scarring in the left upper lobe. No pneumonic consolidation or CHF. No pneumothorax or effusion. The heart is top-normal in size. There is aortic atherosclerosis. There is dextroconvex curvature of the lower thoracic spine. Small to moderate-sized hiatal hernia with air-fluid level is noted. Chronic impacted right humeral neck fracture with healing. IMPRESSION: 1. Emphysematous hyperinflation of lungs with left upper lobe scarring. 2. Chronic fracture of the right surgical neck. 3. Small to  moderate-sized hiatal hernia 4. Aortic atherosclerosis Electronically Signed   By: Ashley Royalty M.D.   On: 07/03/2016 00:09   Ct Head Wo Contrast  Result Date: 07/02/2016 CLINICAL DATA:  Near syncopal episode this evening, subsequently vomited. No loss of consciousness. History of migraines, hypertension and breast cancer. EXAM: CT HEAD WITHOUT CONTRAST TECHNIQUE: Contiguous axial images were obtained from the base of the skull through the vertex without intravenous contrast. COMPARISON:  MRI of the head Aug 02, 2015 FINDINGS: BRAIN: No intraparenchymal hemorrhage, mass effect nor midline shift. The ventricles and sulci are normal for age. Patchy supratentorial white matter hypodensities less than expected for patient's age, though non-specific are most compatible with chronic small vessel ischemic disease. No acute large vascular territory infarcts. No abnormal extra-axial fluid collections. Basal cisterns are patent. VASCULAR: Mild calcific atherosclerosis of the carotid siphons. SKULL: No skull fracture. Mild temporomandibular osteoarthrosis. No significant scalp soft tissue swelling. SINUSES/ORBITS: Partially imaged trace LEFT sphenoid sinus air-fluid level. Imaged mastoid air cells are well aerated. The included ocular globes and orbital contents are non-suspicious. OTHER:  None. IMPRESSION: Negative CT HEAD for age. Electronically Signed   By: Elon Alas M.D.   On: 07/02/2016 22:54   Mr Brain Wo Contrast  Result Date: 07/03/2016 CLINICAL DATA:  Near syncope. History of migraine headache, hypertension, breast cancer EXAM: MRI HEAD WITHOUT CONTRAST MRA HEAD WITHOUT CONTRAST TECHNIQUE: Multiplanar, multiecho pulse sequences of the brain and surrounding structures were obtained without intravenous contrast. Angiographic images of the head were obtained using MRA technique without contrast. COMPARISON:  CT head 07/02/2016 FINDINGS: MRI HEAD FINDINGS Brain: Acute infarct in the left inferior cerebellum in  the left PICA territory. No other acute infarct identified. Cerebral atrophy. Chronic microvascular ischemic changes in the white matter and left anterior basal ganglia. Negative for hemorrhage or mass. Vascular: Normal arterial flow voids. Skull and upper cervical spine: Negative Sinuses/Orbits: Mild mucosal edema paranasal sinuses. Bilateral cataract extraction. Other: None MRA HEAD FINDINGS Occlusion of the left vertebral artery which may be acute and the cause of the left PICA infarct. There is reconstitution of the distal left vertebral artery. Right vertebral artery is patent. Basilar is diffusely disease with mild stenosis in the midportion. Occlusion of the right posterior cerebral artery. Severe stenosis in the left posterior cerebral artery. Atherosclerotic disease and mild stenosis of the right cavernous carotid. Atherosclerotic disease in the right anterior cerebral artery with moderate stenosis the low right A2 segment. Diffuse disease of the right middle cerebral artery with disease in the right M1 segment at the right middle cerebral artery bifurcation extending into both branches. Left internal carotid artery patent. Mild disease in the left anterior cerebral artery. Severe stenosis in the left M1. Extensive disease distal left M1 segment extending in the proximal MCA branches. 2 x 3 mm infundibulum versus aneurysm of the left posterior cerebral artery origin. IMPRESSION: Acute left PICA infarct. Distal left vertebral artery is occluded in the area of left PICA. Chronic microvascular ischemic change is of a mild-to-moderate degree Extensive intracranial atherosclerotic disease as described above 2 x 3 mm infundibulum versus aneurysm of the left posterior cerebral artery origin. Electronically Signed   By: Franchot Gallo M.D.   On: 07/03/2016 10:59   US Carotid Bilateral  Result Date: 07/03/2016 CLINICAL DATA:  Dizziness, hypertension, syncope EXAM: BILATERAL CAROTID DUPLEX ULTRASOUND TECHNIQUE:  Pearline Cables scale imaging, color Doppler and duplex ultrasound were performed of bilateral carotid and vertebral arteries in the neck. COMPARISON:  07/03/2016 brain MRI FINDINGS: Criteria: Quantification of carotid stenosis is based on velocity parameters that correlate the residual internal carotid diameter with NASCET-based stenosis levels, using the diameter of the distal internal carotid lumen as the denominator for stenosis measurement. The following velocity measurements were obtained: RIGHT ICA:  54/11 cm/sec CCA:  16/0 cm/sec SYSTOLIC ICA/CCA RATIO:  7.37 DIASTOLIC ICA/CCA RATIO:  1.06 ECA:  107 cm/sec LEFT ICA:  95/10 cm/sec CCA:  26/9 cm/sec SYSTOLIC ICA/CCA RATIO:  4.85 DIASTOLIC ICA/CCA RATIO:  4.62 ECA:  105 cm/sec RIGHT CAROTID ARTERY: Minor scattered partially calcified atherosclerosis. right distal ICA is tortuous. No hemodynamically significant right ICA stenosis, velocity elevation, or turbulent flow. Degree of narrowing less than 50%. RIGHT VERTEBRAL ARTERY:  Antegrade LEFT CAROTID ARTERY: Similar scattered minor echogenic plaque formation. No hemodynamically significant left ICA stenosis, velocity elevation, or turbulent flow. LEFT VERTEBRAL ARTERY:  Antegrade IMPRESSION: Minor scattered carotid atherosclerosis. No hemodynamically significant ICA stenosis. Degree of narrowing less than 50% bilaterally. Patent antegrade vertebral flow bilaterally Electronically Signed   By: Jerilynn Mages.  Shick M.D.   On: 07/03/2016 12:25  Mr Jodene Nam Head/brain LO Cm  Result Date: 07/03/2016 CLINICAL DATA:  Near syncope. History of migraine headache, hypertension, breast cancer EXAM: MRI HEAD WITHOUT CONTRAST MRA HEAD WITHOUT CONTRAST TECHNIQUE: Multiplanar, multiecho pulse sequences of the brain and surrounding structures were obtained without intravenous contrast. Angiographic images of the head were obtained using MRA technique without contrast. COMPARISON:  CT head 07/02/2016 FINDINGS: MRI HEAD FINDINGS Brain: Acute infarct in  the left inferior cerebellum in the left PICA territory. No other acute infarct identified. Cerebral atrophy. Chronic microvascular ischemic changes in the white matter and left anterior basal ganglia. Negative for hemorrhage or mass. Vascular: Normal arterial flow voids. Skull and upper cervical spine: Negative Sinuses/Orbits: Mild mucosal edema paranasal sinuses. Bilateral cataract extraction. Other: None MRA HEAD FINDINGS Occlusion of the left vertebral artery which may be acute and the cause of the left PICA infarct. There is reconstitution of the distal left vertebral artery. Right vertebral artery is patent. Basilar is diffusely disease with mild stenosis in the midportion. Occlusion of the right posterior cerebral artery. Severe stenosis in the left posterior cerebral artery. Atherosclerotic disease and mild stenosis of the right cavernous carotid. Atherosclerotic disease in the right anterior cerebral artery with moderate stenosis the low right A2 segment. Diffuse disease of the right middle cerebral artery with disease in the right M1 segment at the right middle cerebral artery bifurcation extending into both branches. Left internal carotid artery patent. Mild disease in the left anterior cerebral artery. Severe stenosis in the left M1. Extensive disease distal left M1 segment extending in the proximal MCA branches. 2 x 3 mm infundibulum versus aneurysm of the left posterior cerebral artery origin. IMPRESSION: Acute left PICA infarct. Distal left vertebral artery is occluded in the area of left PICA. Chronic microvascular ischemic change is of a mild-to-moderate degree Extensive intracranial atherosclerotic disease as described above 2 x 3 mm infundibulum versus aneurysm of the left posterior cerebral artery origin. Electronically Signed   By: Franchot Gallo M.D.   On: 07/03/2016 10:59    Micro Results     No results found for this or any previous visit (from the past 240 hour(s)).     Today    Subjective:   Valerie Salts today  Stable For discharge.  Objective:   Blood pressure (!) 170/64, pulse 73, temperature 97.7 F (36.5 C), temperature source Oral, resp. rate 18, height 5\' 5"  (1.651 m), weight 79.3 kg (174 lb 12.8 oz), SpO2 91 %.   Intake/Output Summary (Last 24 hours) at 07/05/16 1400 Last data filed at 07/05/16 0600  Gross per 24 hour  Intake              240 ml  Output                0 ml  Net              240 ml    Exam Awake Alert, Oriented x 3, Normal affect [place].AT,PERRAL Supple Neck,No JVD, No cervical lymphadenopathy appriciated.  Symmetrical Chest wall movement, Good air movement bilaterally, CTAB RRR,No Gallops,Rubs or new Murmurs, No Parasternal Heave +ve B.Sounds, Abd Soft, Non tender, No organomegaly appriciated,  Crusted rash RUE  Data Review   CBC w Diff: Lab Results  Component Value Date   WBC 8.3 07/05/2016   HGB 14.5 07/05/2016   HGB 14.7 03/11/2013   HCT 42.4 07/05/2016   HCT 44.4 03/11/2013   PLT 213 07/05/2016   PLT 210 03/11/2013   LYMPHOPCT 16  08/02/2015   LYMPHOPCT 25.9 03/11/2013   MONOPCT 5 08/02/2015   MONOPCT 8.3 03/11/2013   EOSPCT 2 08/02/2015   EOSPCT 2.5 03/11/2013   BASOPCT 0 08/02/2015   BASOPCT 0.9 03/11/2013    CMP:  Lab Results  Component Value Date   NA 137 07/02/2016   NA 139 03/11/2013   K 3.5 07/02/2016   K 4.2 03/11/2013   CL 103 07/02/2016   CL 102 03/11/2013   CO2 26 07/02/2016   CO2 29 03/11/2013   BUN 23 (H) 07/02/2016   BUN 24 (H) 03/11/2013   CREATININE 0.82 07/05/2016   CREATININE 0.87 03/11/2013   PROT 7.3 07/02/2016   PROT 7.4 03/11/2013   ALBUMIN 4.1 07/02/2016   ALBUMIN 3.8 03/11/2013   BILITOT 0.6 07/02/2016   BILITOT 0.5 03/11/2013   ALKPHOS 64 07/02/2016   ALKPHOS 84 03/11/2013   AST 26 07/02/2016   AST 24 03/11/2013   ALT 17 07/02/2016   ALT 23 03/11/2013  .   Total Time in preparing paper work, data evaluation and todays exam - 35 minutes  Hillary Bow R M.D  on 07/06/2016 at 10:09 AM    Note: This dictation was prepared with Dragon dictation along with smaller phrase technology. Any transcriptional errors that result from this process are unintentional.

## 2016-07-06 ENCOUNTER — Encounter
Admission: RE | Admit: 2016-07-06 | Discharge: 2016-07-06 | Disposition: A | Discharge status: Home / Self Care | Payer: Medicare Other | Source: Ambulatory Visit | Attending: Internal Medicine | Admitting: Internal Medicine

## 2016-07-06 MED ORDER — DOCUSATE SODIUM 100 MG PO CAPS
100.0000 mg | ORAL_CAPSULE | Freq: Every day | ORAL | Status: DC
  Administered 2016-07-06: 11:00:00 100 mg via ORAL
  Filled 2016-07-06: qty 1

## 2016-07-06 MED ORDER — VALACYCLOVIR HCL 1 G PO TABS: 1000.0000 mg | ORAL_TABLET | Freq: Two times a day (BID) | ORAL | 0 refills | Status: DC

## 2016-07-06 MED ORDER — DOCUSATE SODIUM 100 MG PO CAPS: 100.0000 mg | ORAL_CAPSULE | Freq: Every day | ORAL | 0 refills | Status: DC

## 2016-07-06 NOTE — Progress Notes (Signed)
MD order in [facility] to discharge pt to SNF today; Care Management previously completed paperwork for pt discharge; attempted to call report to [facility] [phone] for Room 203B, Benjamine Mola RN unable to take report at this time, left number for return telephone call

## 2016-07-06 NOTE — Progress Notes (Signed)
Pt discharged via wheelchair by auxillary to the visitor's entrance 

## 2016-07-06 NOTE — Discharge Instructions (Signed)
-  patient to be discharged to [place] [place]

## 2016-07-06 NOTE — Progress Notes (Signed)
Telephone report called to [facility] [phone] spoke with [facility] RN; pt is now going to be in Room 207A; report given, no questions verbalized; advised her that the pt would be transported via private automobile

## 2016-07-06 NOTE — Progress Notes (Signed)
Physical Therapy Treatment Patient Details Name: Destiny Neal MRN: [id number] DOB: 03-15-1921 Today's Date: 07/06/2016    History of Present Illness Pt is a 83 yo female pt who presented to ED on 4/1 w/ dizziness. PMH includes arthritis (hips), s/p L THR, breast cancer 2009, depression, HTN, hypothyroidism, migraines (none in past 8-7yrs), and RUE fx (2015). CT results negative, MRI indicates acute cerebellar infarct (L PICA and L vertebral artery occluded).    PT Comments    Pt awake with family at bedside upon entry. Pt very pleasant and agreeable to PT treatment. Pt did not report any dizziness during treatment today (monitored throughout session). Pt reported that she has been doing her gaze stabilization exercises in bed. Pt required supervision to min assist for bed mobility and CGA to min assist for transfers and ambulation. Pt continuing to present with L lateral trunk elongation in sitting and standing and drifts to the L with ambulation suspect d/t L lateral trunk weakness and compensation with R lateral trunk muscles. Pt also presented with posterior lean in sitting today,and pt reports not being aware that she is doing this or the L trunk elongation. Pt improving with sitting balance exercises, but still requiring assistance with moderate standing balance exercises that simulate reaching overhead. PT will continue to assess dizziness, continue with balance training, and continue to increase strength and pt awareness of L lateral trunk muscles during next visit. Current recommendations remain appropriate d/t decreased assistance at home, decreased balance, and pt's decreased safety awareness.        Follow Up Recommendations  SNF     Equipment Recommendations  Rolling walker with 5" wheels    Recommendations for Other Services       Precautions / Restrictions Precautions Precautions: Fall Precaution Comments: Seizures, aspiration Restrictions Weight Bearing Restrictions: No     Mobility  Bed Mobility Overal bed mobility: Needs Assistance Bed Mobility: Supine to Sit;Sit to Supine     Supine to sit: Min assist;HOB elevated Sit to supine: Supervision   General bed mobility comments: Pt required min assist to sit EOB with HOB elevated; after demonstrating difficulty sitting up on her own pt. reached for assistance and utilized hand held assist to sit; Pt required supervision for safety with HOB flat to get back into bed, v/c's provided for pt to scoot toward the middle of the bed as she was very close to the edge on the L side.  Transfers Overall transfer level: Needs assistance Equipment used: Rolling walker (2 wheeled) Transfers: Sit to/from Stand Sit to Stand: Min guard         General transfer comment: Pt anxious to stand and required v/c's to wait for the RW to be infront of her before standing.  Ambulation/Gait Ambulation/Gait assistance: Min guard;Min assist Ambulation Distance (Feet): 160 Feet Assistive device: Rolling walker (2 wheeled)   Gait velocity: Pt gait velocity improved today; pt moving quicker and more impulsive Gait velocity interpretation: at or above normal speed for age/gender General Gait Details: Pt ambulated with L lateral trunk elongated and drifting toward the L, suspected d/t L trunk weakness and overcompesation from the R trunk and arm muscles; v/c's provided to pt to stay in the middle of the hallway and to avoid running into objects on the left, pt able to self correct during last 37ft of ambulation. Pt fatigued after ambulation, requiring a rest break before continuing session.    Stairs            Wheelchair  Mobility    Modified Rankin (Stroke Patients Only)       Balance Overall balance assessment: Needs assistance Sitting-balance support: No upper extremity supported;Feet supported Sitting balance-Leahy Scale: Good Sitting balance - Comments: Pt sitting with L trunk elongated causing R trunk flexion and  weight shift into L hip, suspected d/t L lateral trunk weakness; pt exhibited a posterior lean x3 in sitting, able to self correct with v/c's from therapist, but pt reports that she did not realize she was leaning backwards. Postural control: Posterior lean Standing balance support: Single extremity supported;During functional activity Standing balance-Leahy Scale: Fair Standing balance comment: Pt performed reaching to the low left diagonal with the L arm and high right diagonal with the right arm to touch therapist's hand requiring increased reach with each attempt challenging sitting balance and working to strengthen L lateral turnk muscles; Pt performed a reaching task in standing taking a single step with the R leg and reaching toward R upward diagonal with the R hand to grab a glove from the therapist, pt began with RW, but moved to [facility] assist of L hand to allow pt to step and reach w/o RW in the way; pt required min assist to maintain balance with HHA and lost her balance during one trial requiring the pt to plance her hand on the counter infront of her and the theripist to steady her with the gait belt.                High Level Balance Comments: Pt performed head turns R/L/up/down and increasing/decreasing speed during ambulation w/o change in balance, but continued to drift to the L requiring min assist from therapist to correct.            Cognition Arousal/Alertness: Awake/alert Behavior During Therapy: WFL for tasks assessed/performed Overall Cognitive Status: Within Functional Limits for tasks assessed                                        Exercises      General Comments General comments (skin integrity, edema, etc.): [name] family lpresent during session.      Pertinent Vitals/Pain Pain Assessment: No/denies pain    Home Living                      Prior Function            PT Goals (current goals can now be found in the care plan  section) Acute Rehab PT Goals Patient Stated Goal: go home PT Goal Formulation: With patient Time For Goal Achievement: 07/17/16 Potential to Achieve Goals: Good Progress towards PT goals: Progressing toward goals    Frequency    7X/week      PT Plan Current plan remains appropriate    Co-evaluation             End of Session Equipment Utilized During Treatment: Gait belt Activity Tolerance: Patient tolerated treatment well Patient left: in bed;with call bell/phone within reach;with family/visitor present;with bed alarm set   PT Visit Diagnosis: Difficulty in walking, not elsewhere classified (R26.2);Dizziness and giddiness (R42)     Time: 4540-9811 PT Time Calculation (min) (ACUTE ONLY): 23 min  Charges:                       G Codes:         Annamarie Yamaguchi,  SPT 07/06/2016, 11:53 AM

## 2016-07-11 ENCOUNTER — Non-Acute Institutional Stay (SKILLED_NURSING_FACILITY): Payer: Medicare Other | Admitting: Gerontology

## 2016-07-11 DIAGNOSIS — I63432 Cerebral infarction due to embolism of left posterior cerebral artery: Secondary | ICD-10-CM | POA: Diagnosis not present

## 2016-07-13 NOTE — Progress Notes (Signed)
Location:      Place of Service:  SNF (31) Provider:  Toni Arthurs, NP-C  [facility]  Patient Care Team: Katheren Shams as PCP - General  Extended Emergency Contact Information Primary Emergency Contact: Kernodle,Patricia L Address: [address]          [place], [place] [address] [place] Phone: [phone] Work Phone: [phone] Relation: Daughter Secondary Emergency Contact: [place] Phone: [phone] Relation: Son  Code Status:  Full Goals of care: Advanced Directive information Advanced Directives 07/03/2016  Does Patient Have a Medical Advance Directive? Yes  Type of Paramedic of [place];Living will  Does patient want to make changes to medical advance directive? No - Patient declined  Copy of [place] in Chart? Yes     Chief Complaint  Patient presents with  . Follow-up    HPI:  Pt is a 83 y.o. female seen today for follow up visit s/p admission to facility for rehab s/p hospitalization at [facility] for acute CVA. Pt reports her only residual is some mild weakness. She is receiving PT/OT for generalized weakness and deconditioning. Bilateral hand grips, flexion, extension- equal and strong. Bilateral ankle plantar and dorsi-flexion strong and equal. Pt denies coughing with po intake. Pt denies n/v/d/f/c/cp/sob/ha/abd pain/dizziness/cough. Pt denies pain. Pt reports her appetite is good and having regular BMs. Pt is in good spirits. VSS. No other complaints.      Past Medical History:  Diagnosis Date  . Arthritis    hips  . Breast cancer ([place])   . Depression   . Hypertension   . Hypothyroidism   . Migraines    none in 8-10 yrs  . Wears hearing aid    bilateral   Past Surgical History:  Procedure Laterality Date  . BREAST LUMPECTOMY    . CATARACT EXTRACTION W/PHACO Left 06/21/2015   Procedure: CATARACT EXTRACTION PHACO AND INTRAOCULAR LENS  PLACEMENT (IOC);  Surgeon: Ronnell Freshwater, MD;  Location: [facility];  Service: Ophthalmology;  Laterality: Left;  . CATARACT EXTRACTION W/PHACO Right 07/12/2015   Procedure: CATARACT EXTRACTION PHACO AND INTRAOCULAR LENS PLACEMENT ([place]) right;  Surgeon: Ronnell Freshwater, MD;  Location: [facility];  Service: Ophthalmology;  Laterality: Right;  . JOINT REPLACEMENT Left    total hip    Allergies  Allergen Reactions  . Lincocin [Lincomycin Hcl] Swelling    angioedema  . Codeine     Severe headache  . Hydrocodone Nausea Only and Other (See Comments)    fainted  . Metronidazole Itching    severe  . Penicillins Swelling  . Tramadol Nausea And Vomiting  . Betadine [Povidone Iodine] Rash    Itching  . Iodine Rash    itching    Allergies as of 07/11/2016      Reactions   Lincocin [lincomycin Hcl] Swelling   angioedema   Codeine    Severe headache   Hydrocodone Nausea Only, Other (See Comments)   fainted   Metronidazole Itching   severe   Penicillins Swelling   Tramadol Nausea And Vomiting   Betadine [povidone Iodine] Rash   Itching   Iodine Rash   itching      Medication List       Accurate as of 07/11/16 11:59 PM. Always use your most recent med list.          amLODipine 5 MG tablet Commonly known as:  NORVASC Take 1 tablet (5  mg total) by mouth daily.   aspirin 81 MG tablet Take 81 mg by mouth daily.   CALCIUM + D PO Take 1 tablet by mouth 2 (two) times daily.   clopidogrel 75 MG tablet Commonly known as:  PLAVIX Take 1 tablet (75 mg total) by mouth daily.   docusate sodium 100 MG capsule Commonly known as:  COLACE Take 1 capsule (100 mg total) by mouth daily.   dorzolamide-timolol 22.3-6.8 MG/ML ophthalmic solution Commonly known as:  COSOPT Place 1 drop into both eyes 2 (two) times daily.   GRAPESEED EXTRACT PO Take 100 mg by mouth daily.   hydrALAZINE 25 MG tablet Commonly known as:  APRESOLINE Take 1 tablet  (25 mg total) by mouth 3 (three) times daily.   latanoprost 0.005 % ophthalmic solution Commonly known as:  XALATAN Place 1 drop into both eyes at bedtime.   levothyroxine 25 MCG tablet Commonly known as:  SYNTHROID, LEVOTHROID Take 25 mcg by mouth daily before breakfast.   multivitamin capsule Take 1 capsule by mouth daily.   PARoxetine 10 MG tablet Commonly known as:  PAXIL Take 5 mg by mouth daily.   rosuvastatin 5 MG tablet Commonly known as:  CRESTOR Take 1 tablet (5 mg total) by mouth daily at 6 PM.   valACYclovir 1000 MG tablet Commonly known as:  VALTREX Take 1 tablet (1,000 mg total) by mouth 2 (two) times daily.   vitamin B-12 1000 MCG tablet Commonly known as:  CYANOCOBALAMIN Take 2,000 mcg by mouth daily.   B-12 IJ Inject 1,000 Units as directed every 30 (thirty) days.       Review of Systems  Constitutional: Negative for activity change, appetite change, chills, diaphoresis and fever.  HENT: Negative for congestion, sneezing, sore throat, trouble swallowing and voice change.   Respiratory: Negative for apnea, cough, choking, chest tightness, shortness of breath and wheezing.   Cardiovascular: Negative for chest pain, palpitations and leg swelling.  Gastrointestinal: Negative for abdominal distention, abdominal pain, constipation, diarrhea and nausea.  Genitourinary: Negative for difficulty urinating, dysuria, frequency and urgency.  Musculoskeletal: Negative for back pain, gait problem and myalgias. Arthralgias: typical arthritis.  Skin: Negative for color change, pallor, rash and wound.  Neurological: Negative for dizziness, tremors, syncope, speech difficulty, weakness, numbness and headaches.  Psychiatric/Behavioral: Negative for agitation and behavioral problems.  All other systems reviewed and are negative.    There is no immunization history on file for this patient. There are no preventive care reminders to display for this patient. No flowsheet  data found. Functional Status Survey:    Vitals:   07/11/16 0530  BP: (!) 146/66  Pulse: 80  Resp: 18  Temp: 97.7 F (36.5 C)  SpO2: 99%   There is no height or weight on file to calculate BMI. Physical Exam  Constitutional: She is oriented to person, place, and time. Vital signs are normal. She appears well-developed and well-nourished. She is active and cooperative. She does not appear ill. No distress.  HENT:  Head: Normocephalic and atraumatic.  Mouth/Throat: Uvula is midline, oropharynx is clear and moist and mucous membranes are normal. Mucous membranes are not pale, not dry and not cyanotic.  Eyes: Conjunctivae, EOM and lids are normal. Pupils are equal, round, and reactive to light.  Neck: Trachea normal, normal range of motion and full passive range of motion without pain. Neck supple. No JVD present. No tracheal deviation, no edema and no erythema present. No thyromegaly present.  Cardiovascular: Normal rate, regular rhythm,  normal heart sounds, intact distal pulses and normal pulses.  Exam reveals no gallop, no distant heart sounds and no friction rub.   No murmur heard. Pulmonary/Chest: Effort normal and breath sounds normal. No accessory muscle usage. No respiratory distress. She has no wheezes. She has no rales. She exhibits no tenderness.  Abdominal: Normal appearance and bowel sounds are normal. She exhibits no distension and no ascites. There is no tenderness.  Musculoskeletal: Normal range of motion. She exhibits no edema or tenderness.  Expected osteoarthritis, stiffness  Neurological: She is alert and oriented to person, place, and time. She has normal strength.  Skin: Skin is warm, dry and intact. No rash noted. She is not diaphoretic. No cyanosis or erythema. No pallor. Nails show no clubbing.  Psychiatric: She has a normal mood and affect. Her speech is normal and behavior is normal. Judgment and thought content normal. Cognition and memory are normal.  Nursing  note and vitals reviewed.   Labs reviewed:  Recent Labs  08/02/15 0642 07/02/16 2202 07/05/16 0422  NA 137 137  --   K 3.7 3.5  --   CL 103 103  --   CO2 25 26  --   GLUCOSE 107* 139*  --   BUN 16 23*  --   CREATININE 0.78 0.79 0.82  CALCIUM 9.3 9.4  --     Recent Labs  07/02/16 2202  AST 26  ALT 17  ALKPHOS 64  BILITOT 0.6  PROT 7.3  ALBUMIN 4.1    Recent Labs  08/02/15 0642 07/02/16 2202 07/05/16 0422  WBC 11.0 12.1* 8.3  NEUTROABS 8.4*  --   --   HGB 14.6 15.3 14.5  HCT 42.5 45.7 42.4  MCV 92.5 94.1 92.2  PLT 210 195 213   No results found for: TSH Lab Results  Component Value Date   HGBA1C 5.8 (H) 07/03/2016   Lab Results  Component Value Date   CHOL 190 07/03/2016   HDL 63 07/03/2016   LDLCALC 120 (H) 07/03/2016   TRIG 36 07/03/2016   CHOLHDL 3.0 07/03/2016    Significant Diagnostic Results in last 30 days:  Dg Chest 2 View  Result Date: 07/03/2016 CLINICAL DATA:  Transient ischemic attack, emphysema EXAM: CHEST  2 VIEW COMPARISON:  None. FINDINGS: Emphysematous hyperinflation of the lungs with scarring in the left upper lobe. No pneumonic consolidation or CHF. No pneumothorax or effusion. The heart is top-normal in size. There is aortic atherosclerosis. There is dextroconvex curvature of the lower thoracic spine. Small to moderate-sized hiatal hernia with air-fluid level is noted. Chronic impacted right humeral neck fracture with healing. IMPRESSION: 1. Emphysematous hyperinflation of lungs with left upper lobe scarring. 2. Chronic fracture of the right surgical neck. 3. Small to moderate-sized hiatal hernia 4. Aortic atherosclerosis Electronically Signed   By: Ashley Royalty M.D.   On: 07/03/2016 00:09   Ct Head Wo Contrast  Result Date: 07/02/2016 CLINICAL DATA:  Near syncopal episode this evening, subsequently vomited. No loss of consciousness. History of migraines, hypertension and breast cancer. EXAM: CT HEAD WITHOUT CONTRAST TECHNIQUE: Contiguous  axial images were obtained from the base of the skull through the vertex without intravenous contrast. COMPARISON:  MRI of the head Aug 02, 2015 FINDINGS: BRAIN: No intraparenchymal hemorrhage, mass effect nor midline shift. The ventricles and sulci are normal for age. Patchy supratentorial white matter hypodensities less than expected for patient's age, though non-specific are most compatible with chronic small vessel ischemic disease. No acute large vascular  territory infarcts. No abnormal extra-axial fluid collections. Basal cisterns are patent. VASCULAR: Mild calcific atherosclerosis of the carotid siphons. SKULL: No skull fracture. Mild temporomandibular osteoarthrosis. No significant scalp soft tissue swelling. SINUSES/ORBITS: Partially imaged trace LEFT sphenoid sinus air-fluid level. Imaged mastoid air cells are well aerated. The included ocular globes and orbital contents are non-suspicious. OTHER: None. IMPRESSION: Negative CT HEAD for age. Electronically Signed   By: Elon Alas M.D.   On: 07/02/2016 22:54   Mr Brain Wo Contrast  Result Date: 07/03/2016 CLINICAL DATA:  Near syncope. History of migraine headache, hypertension, breast cancer EXAM: MRI HEAD WITHOUT CONTRAST MRA HEAD WITHOUT CONTRAST TECHNIQUE: Multiplanar, multiecho pulse sequences of the brain and surrounding structures were obtained without intravenous contrast. Angiographic images of the head were obtained using MRA technique without contrast. COMPARISON:  CT head 07/02/2016 FINDINGS: MRI HEAD FINDINGS Brain: Acute infarct in the left inferior cerebellum in the left PICA territory. No other acute infarct identified. Cerebral atrophy. Chronic microvascular ischemic changes in the white matter and left anterior basal ganglia. Negative for hemorrhage or mass. Vascular: Normal arterial flow voids. Skull and upper cervical spine: Negative Sinuses/Orbits: Mild mucosal edema paranasal sinuses. Bilateral cataract extraction. Other: None  MRA HEAD FINDINGS Occlusion of the left vertebral artery which may be acute and the cause of the left PICA infarct. There is reconstitution of the distal left vertebral artery. Right vertebral artery is patent. Basilar is diffusely disease with mild stenosis in the midportion. Occlusion of the right posterior cerebral artery. Severe stenosis in the left posterior cerebral artery. Atherosclerotic disease and mild stenosis of the right cavernous carotid. Atherosclerotic disease in the right anterior cerebral artery with moderate stenosis the low right A2 segment. Diffuse disease of the right middle cerebral artery with disease in the right M1 segment at the right middle cerebral artery bifurcation extending into both branches. Left internal carotid artery patent. Mild disease in the left anterior cerebral artery. Severe stenosis in the left M1. Extensive disease distal left M1 segment extending in the proximal MCA branches. 2 x 3 mm infundibulum versus aneurysm of the left posterior cerebral artery origin. IMPRESSION: Acute left PICA infarct. Distal left vertebral artery is occluded in the area of left PICA. Chronic microvascular ischemic change is of a mild-to-moderate degree Extensive intracranial atherosclerotic disease as described above 2 x 3 mm infundibulum versus aneurysm of the left posterior cerebral artery origin. Electronically Signed   By: Franchot Gallo M.D.   On: 07/03/2016 10:59   US Carotid Bilateral  Result Date: 07/03/2016 CLINICAL DATA:  Dizziness, hypertension, syncope EXAM: BILATERAL CAROTID DUPLEX ULTRASOUND TECHNIQUE: Pearline Cables scale imaging, color Doppler and duplex ultrasound were performed of bilateral carotid and vertebral arteries in the neck. COMPARISON:  07/03/2016 brain MRI FINDINGS: Criteria: Quantification of carotid stenosis is based on velocity parameters that correlate the residual internal carotid diameter with NASCET-based stenosis levels, using the diameter of the distal internal  carotid lumen as the denominator for stenosis measurement. The following velocity measurements were obtained: RIGHT ICA:  54/11 cm/sec CCA:  50/5 cm/sec SYSTOLIC ICA/CCA RATIO:  3.97 DIASTOLIC ICA/CCA RATIO:  6.73 ECA:  107 cm/sec LEFT ICA:  95/10 cm/sec CCA:  41/9 cm/sec SYSTOLIC ICA/CCA RATIO:  3.79 DIASTOLIC ICA/CCA RATIO:  0.24 ECA:  105 cm/sec RIGHT CAROTID ARTERY: Minor scattered partially calcified atherosclerosis. right distal ICA is tortuous. No hemodynamically significant right ICA stenosis, velocity elevation, or turbulent flow. Degree of narrowing less than 50%. RIGHT VERTEBRAL ARTERY:  Antegrade LEFT CAROTID ARTERY: Similar  scattered minor echogenic plaque formation. No hemodynamically significant left ICA stenosis, velocity elevation, or turbulent flow. LEFT VERTEBRAL ARTERY:  Antegrade IMPRESSION: Minor scattered carotid atherosclerosis. No hemodynamically significant ICA stenosis. Degree of narrowing less than 50% bilaterally. Patent antegrade vertebral flow bilaterally Electronically Signed   By: Jerilynn Mages.  Shick M.D.   On: 07/03/2016 12:25   Mr Jodene Nam Head/brain PP Cm  Result Date: 07/03/2016 CLINICAL DATA:  Near syncope. History of migraine headache, hypertension, breast cancer EXAM: MRI HEAD WITHOUT CONTRAST MRA HEAD WITHOUT CONTRAST TECHNIQUE: Multiplanar, multiecho pulse sequences of the brain and surrounding structures were obtained without intravenous contrast. Angiographic images of the head were obtained using MRA technique without contrast. COMPARISON:  CT head 07/02/2016 FINDINGS: MRI HEAD FINDINGS Brain: Acute infarct in the left inferior cerebellum in the left PICA territory. No other acute infarct identified. Cerebral atrophy. Chronic microvascular ischemic changes in the white matter and left anterior basal ganglia. Negative for hemorrhage or mass. Vascular: Normal arterial flow voids. Skull and upper cervical spine: Negative Sinuses/Orbits: Mild mucosal edema paranasal sinuses. Bilateral  cataract extraction. Other: None MRA HEAD FINDINGS Occlusion of the left vertebral artery which may be acute and the cause of the left PICA infarct. There is reconstitution of the distal left vertebral artery. Right vertebral artery is patent. Basilar is diffusely disease with mild stenosis in the midportion. Occlusion of the right posterior cerebral artery. Severe stenosis in the left posterior cerebral artery. Atherosclerotic disease and mild stenosis of the right cavernous carotid. Atherosclerotic disease in the right anterior cerebral artery with moderate stenosis the low right A2 segment. Diffuse disease of the right middle cerebral artery with disease in the right M1 segment at the right middle cerebral artery bifurcation extending into both branches. Left internal carotid artery patent. Mild disease in the left anterior cerebral artery. Severe stenosis in the left M1. Extensive disease distal left M1 segment extending in the proximal MCA branches. 2 x 3 mm infundibulum versus aneurysm of the left posterior cerebral artery origin. IMPRESSION: Acute left PICA infarct. Distal left vertebral artery is occluded in the area of left PICA. Chronic microvascular ischemic change is of a mild-to-moderate degree Extensive intracranial atherosclerotic disease as described above 2 x 3 mm infundibulum versus aneurysm of the left posterior cerebral artery origin. Electronically Signed   By: Franchot Gallo M.D.   On: 07/03/2016 10:59    Assessment/Plan 1. Cerebrovascular accident (CVA) due to embolism of left posterior cerebral artery (HCC)  Continue PT/OT  Continue ambulation with walker  Fall precautions  Continue ASA 81 mg po Q Day  Continue Plavix 75 mg po Q Day  Continue Rosuvastatin 5 mg po Q Day   Family/ staff Communication:   Total Time:  Documentation:  Face to Face:  Family/Phone:   Labs/tests ordered:    Medication list reviewed and assessed for continued appropriateness. Monthly  medication orders reviewed and signed.  Vikki Ports, NP-C Geriatrics [facility] [phone], [place] [address] Cell Phone (Mon-Fri 8am-5pm):  [phone] On Call:  [phone] & follow prompts after 5pm & weekends Office Phone:  [phone] Office Fax:  [phone]

## 2017-04-10 IMAGING — CR DG CHEST 2V
1 series · 2 of 2 positions shown · non-contrast
Comparison: None.

CLINICAL DATA: Transient ischemic attack, emphysema

EXAM:
CHEST  2 VIEW

[Series 1: dg chest 2 view · 0.14mm/px · 2 of 2 slices shown]
[im 1/2]
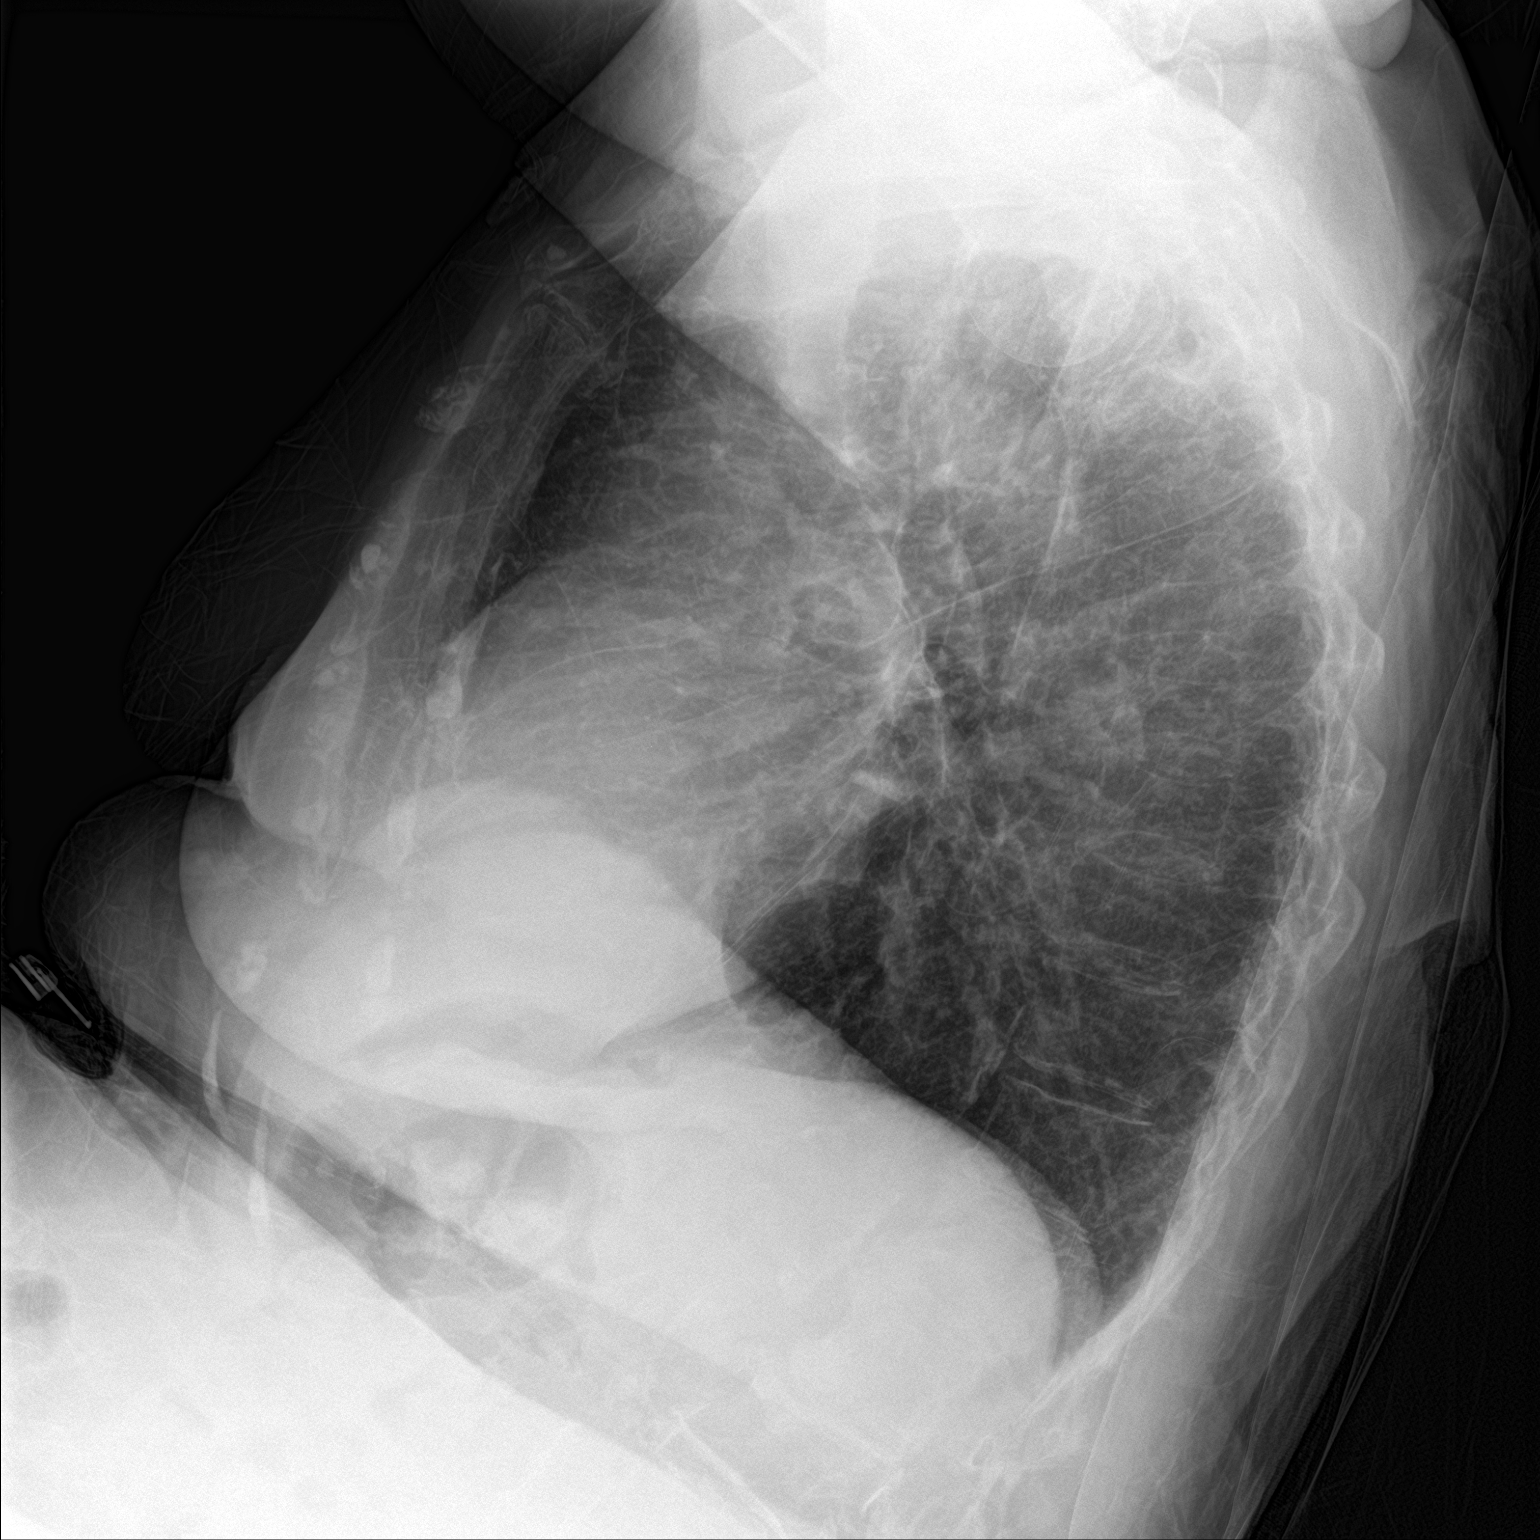
[im 2/2]
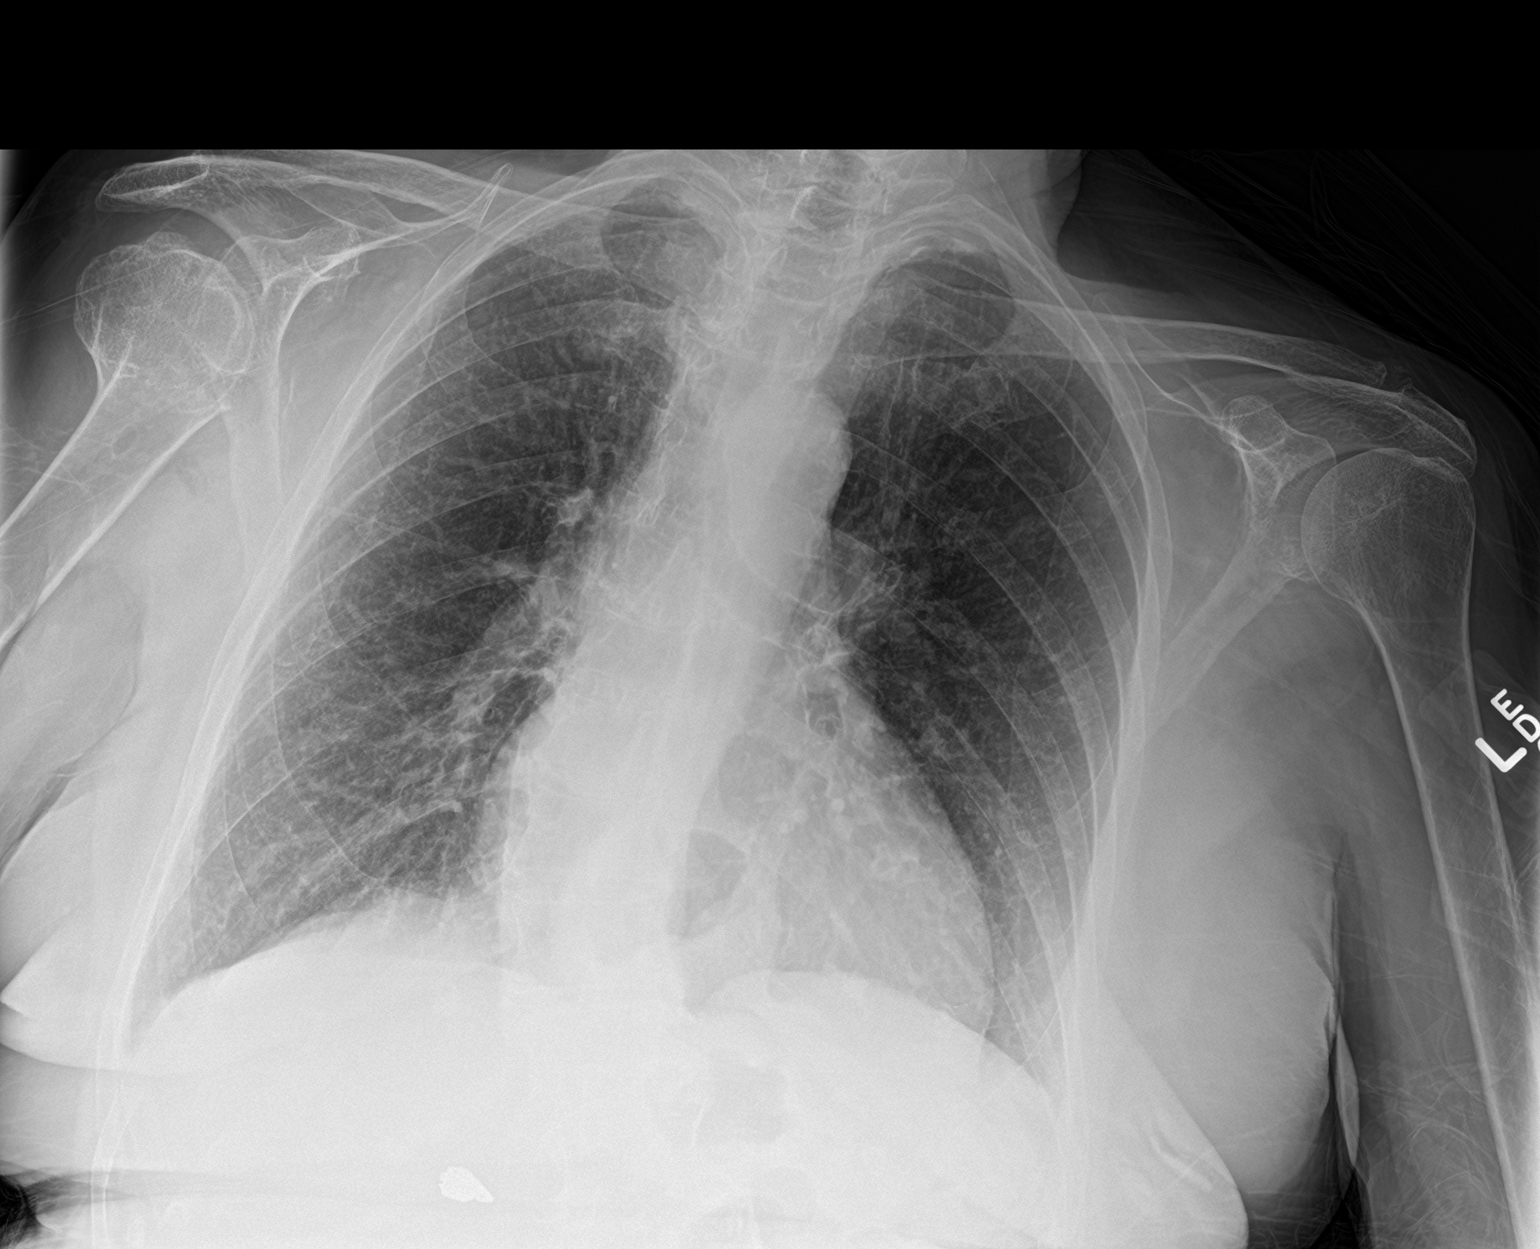

[2 of 2 positions shown; findings below may reference images not displayed]

FINDINGS: Emphysematous hyperinflation of the lungs with scarring in the left
upper lobe. No pneumonic consolidation or CHF. No pneumothorax or
effusion. The heart is top-normal in size. There is aortic
atherosclerosis. There is dextroconvex curvature of the lower
thoracic spine. Small to moderate-sized hiatal hernia with air-fluid
level is noted. Chronic impacted right humeral neck fracture with
healing.
IMPRESSION: 1. Emphysematous hyperinflation of lungs with left upper lobe
scarring.
2. Chronic fracture of the right surgical neck.
3. Small to moderate-sized hiatal hernia
4. Aortic atherosclerosis

## 2018-03-01 ENCOUNTER — Inpatient Hospital Stay: Payer: Medicare Other

## 2018-03-01 ENCOUNTER — Other Ambulatory Visit: Payer: Self-pay

## 2018-03-01 ENCOUNTER — Encounter
Admission: EM | Disposition: A | Discharge status: Skilled Nursing Facility | Payer: Self-pay | Source: Home / Self Care | Attending: Internal Medicine

## 2018-03-01 ENCOUNTER — Emergency Department: Payer: Medicare Other

## 2018-03-01 ENCOUNTER — Inpatient Hospital Stay: Payer: Medicare Other | Admitting: Anesthesiology

## 2018-03-01 ENCOUNTER — Inpatient Hospital Stay
Admission: EM | Admit: 2018-03-01 | Discharge: 2018-03-04 | DRG: 470 | Disposition: A | Discharge status: Skilled Nursing Facility | Payer: Medicare Other | Attending: Internal Medicine | Admitting: Internal Medicine

## 2018-03-01 DIAGNOSIS — F329 Major depressive disorder, single episode, unspecified: Secondary | ICD-10-CM | POA: Diagnosis present

## 2018-03-01 DIAGNOSIS — E039 Hypothyroidism, unspecified: Secondary | ICD-10-CM | POA: Diagnosis present

## 2018-03-01 DIAGNOSIS — Z9842 Cataract extraction status, left eye: Secondary | ICD-10-CM

## 2018-03-01 DIAGNOSIS — Z9841 Cataract extraction status, right eye: Secondary | ICD-10-CM | POA: Diagnosis not present

## 2018-03-01 DIAGNOSIS — M1990 Unspecified osteoarthritis, unspecified site: Secondary | ICD-10-CM | POA: Diagnosis present

## 2018-03-01 DIAGNOSIS — Z79899 Other long term (current) drug therapy: Secondary | ICD-10-CM | POA: Diagnosis not present

## 2018-03-01 DIAGNOSIS — E785 Hyperlipidemia, unspecified: Secondary | ICD-10-CM | POA: Diagnosis present

## 2018-03-01 DIAGNOSIS — I10 Essential (primary) hypertension: Secondary | ICD-10-CM | POA: Diagnosis present

## 2018-03-01 DIAGNOSIS — Z79890 Hormone replacement therapy (postmenopausal): Secondary | ICD-10-CM

## 2018-03-01 DIAGNOSIS — H9190 Unspecified hearing loss, unspecified ear: Secondary | ICD-10-CM | POA: Diagnosis present

## 2018-03-01 DIAGNOSIS — S72009A Fracture of unspecified part of neck of unspecified femur, initial encounter for closed fracture: Secondary | ICD-10-CM | POA: Diagnosis present

## 2018-03-01 DIAGNOSIS — Z888 Allergy status to other drugs, medicaments and biological substances status: Secondary | ICD-10-CM

## 2018-03-01 DIAGNOSIS — S72001A Fracture of unspecified part of neck of right femur, initial encounter for closed fracture: Principal | ICD-10-CM | POA: Diagnosis present

## 2018-03-01 DIAGNOSIS — Z961 Presence of intraocular lens: Secondary | ICD-10-CM | POA: Diagnosis present

## 2018-03-01 DIAGNOSIS — Z8673 Personal history of transient ischemic attack (TIA), and cerebral infarction without residual deficits: Secondary | ICD-10-CM | POA: Diagnosis not present

## 2018-03-01 DIAGNOSIS — R52 Pain, unspecified: Secondary | ICD-10-CM

## 2018-03-01 DIAGNOSIS — W010XXA Fall on same level from slipping, tripping and stumbling without subsequent striking against object, initial encounter: Secondary | ICD-10-CM | POA: Diagnosis present

## 2018-03-01 DIAGNOSIS — Z974 Presence of external hearing-aid: Secondary | ICD-10-CM

## 2018-03-01 DIAGNOSIS — Z96642 Presence of left artificial hip joint: Secondary | ICD-10-CM | POA: Diagnosis present

## 2018-03-01 DIAGNOSIS — Z7902 Long term (current) use of antithrombotics/antiplatelets: Secondary | ICD-10-CM | POA: Diagnosis not present

## 2018-03-01 DIAGNOSIS — Z853 Personal history of malignant neoplasm of breast: Secondary | ICD-10-CM

## 2018-03-01 DIAGNOSIS — Z880 Allergy status to penicillin: Secondary | ICD-10-CM | POA: Diagnosis not present

## 2018-03-01 DIAGNOSIS — Z96649 Presence of unspecified artificial hip joint: Secondary | ICD-10-CM

## 2018-03-01 DIAGNOSIS — Z885 Allergy status to narcotic agent status: Secondary | ICD-10-CM

## 2018-03-01 HISTORY — PX: HIP ARTHROPLASTY: SHX981

## 2018-03-01 LAB — COMPREHENSIVE METABOLIC PANEL
ALBUMIN: 4 g/dL (ref 3.5–5.0)
ALT: 18 U/L (ref 0–44)
ANION GAP: 10 (ref 5–15)
AST: 26 U/L (ref 15–41)
Alkaline Phosphatase: 63 U/L (ref 38–126)
BUN: 18 mg/dL (ref 8–23)
CALCIUM: 9.1 mg/dL (ref 8.9–10.3)
CHLORIDE: 106 mmol/L (ref 98–111)
CO2: 25 mmol/L (ref 22–32)
Creatinine, Ser: 0.74 mg/dL (ref 0.44–1.00)
GFR calc non Af Amer: >60 mL/min (ref >60)
Glucose, Bld: 129 mg/dL — ABNORMAL HIGH (ref 70–99)
Potassium: 3.6 mmol/L (ref 3.5–5.1)
SODIUM: 141 mmol/L (ref 135–145)
Total Bilirubin: 0.8 mg/dL (ref 0.3–1.2)
Total Protein: 6.8 g/dL (ref 6.5–8.1)

## 2018-03-01 LAB — URINALYSIS, ROUTINE W REFLEX MICROSCOPIC
Bilirubin Urine: NEGATIVE
Glucose, UA: NEGATIVE mg/dL
Hgb urine dipstick: NEGATIVE
Ketones, ur: 5 mg/dL — AB
Leukocytes, UA: NEGATIVE
Nitrite: NEGATIVE
Protein, ur: NEGATIVE mg/dL
SPECIFIC GRAVITY, URINE: 1.011 (ref 1.005–1.030)
pH: 6 (ref 5.0–8.0)

## 2018-03-01 LAB — PROTIME-INR
INR: 1.02
Prothrombin Time: 13.3 seconds (ref 11.4–15.2)

## 2018-03-01 LAB — CBC
HCT: 45 % (ref 36.0–46.0)
Hemoglobin: 14.7 g/dL (ref 12.0–15.0)
MCH: 31.5 pg (ref 26.0–34.0)
MCHC: 32.7 g/dL (ref 30.0–36.0)
MCV: 96.4 fL (ref 80.0–100.0)
PLATELETS: 197 10*3/uL (ref 150–400)
RBC: 4.67 MIL/uL (ref 3.87–5.11)
RDW: 12.6 % (ref 11.5–15.5)
WBC: 15.6 10*3/uL — AB (ref 4.0–10.5)
nRBC: 0 % (ref 0.0–0.2)

## 2018-03-01 LAB — SURGICAL PCR SCREEN
MRSA, PCR: NEGATIVE
Staphylococcus aureus: NEGATIVE

## 2018-03-01 SURGERY — HEMIARTHROPLASTY, HIP, DIRECT ANTERIOR APPROACH, FOR FRACTURE
Anesthesia: General | Site: Hip | Laterality: Right

## 2018-03-01 MED ORDER — ONDANSETRON HCL 4 MG PO TABS: 4.0000 mg | ORAL_TABLET | Freq: Four times a day (QID) | ORAL | Status: DC | PRN

## 2018-03-01 MED ORDER — FENTANYL CITRATE (PF) 250 MCG/5ML IJ SOLN
INTRAMUSCULAR | Status: AC
  Filled 2018-03-01: qty 5

## 2018-03-01 MED ORDER — FLEET ENEMA 7-19 GM/118ML RE ENEM: 1.0000 | ENEMA | Freq: Once | RECTAL | Status: DC | PRN

## 2018-03-01 MED ORDER — FENTANYL CITRATE (PF) 100 MCG/2ML IJ SOLN
INTRAMUSCULAR | Status: DC | PRN
  Administered 2018-03-01: 50 ug via INTRAVENOUS
  Administered 2018-03-01: 100 ug via INTRAVENOUS
  Administered 2018-03-01 (×2): 50 ug via INTRAVENOUS

## 2018-03-01 MED ORDER — DIPHENHYDRAMINE HCL 12.5 MG/5ML PO ELIX: 12.5000 mg | ORAL_SOLUTION | ORAL | Status: DC | PRN

## 2018-03-01 MED ORDER — PROPOFOL 10 MG/ML IV BOLUS
INTRAVENOUS | Status: AC
  Filled 2018-03-01: qty 20

## 2018-03-01 MED ORDER — ROSUVASTATIN CALCIUM 10 MG PO TABS
5.0000 mg | ORAL_TABLET | Freq: Every day | ORAL | Status: DC
  Administered 2018-03-02 – 2018-03-04 (×3): 5 mg via ORAL
  Filled 2018-03-01 (×3): qty 1

## 2018-03-01 MED ORDER — SODIUM CHLORIDE 0.9 % IV SOLN
INTRAVENOUS | Status: DC | PRN
  Administered 2018-03-01: 25 ug/min via INTRAVENOUS

## 2018-03-01 MED ORDER — CEFAZOLIN SODIUM-DEXTROSE 2-4 GM/100ML-% IV SOLN
2.0000 g | Freq: Once | INTRAVENOUS | Status: DC
  Filled 2018-03-01: qty 100

## 2018-03-01 MED ORDER — BUPIVACAINE LIPOSOME 1.3 % IJ SUSP
INTRAMUSCULAR | Status: AC
  Filled 2018-03-01: qty 20

## 2018-03-01 MED ORDER — CEFAZOLIN SODIUM-DEXTROSE 2-4 GM/100ML-% IV SOLN
2.0000 g | Freq: Four times a day (QID) | INTRAVENOUS | Status: AC
  Administered 2018-03-02 (×3): 2 g via INTRAVENOUS
  Filled 2018-03-01 (×3): qty 100

## 2018-03-01 MED ORDER — METOCLOPRAMIDE HCL 10 MG PO TABS: 5.0000 mg | ORAL_TABLET | Freq: Three times a day (TID) | ORAL | Status: DC | PRN

## 2018-03-01 MED ORDER — LATANOPROST 0.005 % OP SOLN
1.0000 [drp] | Freq: Every day | OPHTHALMIC | Status: DC
  Administered 2018-03-02 – 2018-03-03 (×2): 1 [drp] via OPHTHALMIC
  Filled 2018-03-01: qty 2.5

## 2018-03-01 MED ORDER — CEFAZOLIN SODIUM-DEXTROSE 2-3 GM-%(50ML) IV SOLR
INTRAVENOUS | Status: DC | PRN
  Administered 2018-03-01: 2 g via INTRAVENOUS

## 2018-03-01 MED ORDER — DEXAMETHASONE SODIUM PHOSPHATE 10 MG/ML IJ SOLN
INTRAMUSCULAR | Status: DC | PRN
  Administered 2018-03-01: 10 mg via INTRAVENOUS

## 2018-03-01 MED ORDER — SODIUM CHLORIDE (PF) 0.9 % IJ SOLN
INTRAMUSCULAR | Status: AC
  Filled 2018-03-01: qty 50

## 2018-03-01 MED ORDER — PHENYLEPHRINE HCL 10 MG/ML IJ SOLN
INTRAMUSCULAR | Status: DC | PRN
  Administered 2018-03-01 (×2): 100 ug via INTRAVENOUS

## 2018-03-01 MED ORDER — ONDANSETRON HCL 4 MG/2ML IJ SOLN: 4.0000 mg | Freq: Four times a day (QID) | INTRAMUSCULAR | Status: DC | PRN

## 2018-03-01 MED ORDER — BUPIVACAINE-EPINEPHRINE (PF) 0.25% -1:200000 IJ SOLN
INTRAMUSCULAR | Status: AC
  Filled 2018-03-01: qty 30

## 2018-03-01 MED ORDER — BISACODYL 10 MG RE SUPP: 10.0000 mg | Freq: Every day | RECTAL | Status: DC | PRN

## 2018-03-01 MED ORDER — OXYCODONE-ACETAMINOPHEN 5-325 MG PO TABS
1.0000 | ORAL_TABLET | ORAL | Status: DC | PRN
  Administered 2018-03-01: 1 via ORAL
  Filled 2018-03-01: qty 1

## 2018-03-01 MED ORDER — MORPHINE SULFATE (PF) 2 MG/ML IV SOLN
2.0000 mg | Freq: Once | INTRAVENOUS | Status: AC
  Administered 2018-03-01: 2 mg via INTRAVENOUS
  Filled 2018-03-01: qty 1

## 2018-03-01 MED ORDER — ACETAMINOPHEN 650 MG RE SUPP: 650.0000 mg | Freq: Four times a day (QID) | RECTAL | Status: DC | PRN

## 2018-03-01 MED ORDER — GABAPENTIN 100 MG PO CAPS
100.0000 mg | ORAL_CAPSULE | Freq: Two times a day (BID) | ORAL | Status: DC
  Administered 2018-03-01 – 2018-03-04 (×7): 100 mg via ORAL
  Filled 2018-03-01 (×7): qty 1

## 2018-03-01 MED ORDER — TRANEXAMIC ACID 1000 MG/10ML IV SOLN
INTRAVENOUS | Status: AC
  Filled 2018-03-01: qty 10

## 2018-03-01 MED ORDER — SUGAMMADEX SODIUM 200 MG/2ML IV SOLN
INTRAVENOUS | Status: DC | PRN
  Administered 2018-03-01: 160 mg via INTRAVENOUS

## 2018-03-01 MED ORDER — LIDOCAINE HCL (CARDIAC) PF 100 MG/5ML IV SOSY
PREFILLED_SYRINGE | INTRAVENOUS | Status: DC | PRN
  Administered 2018-03-01: 80 mg via INTRAVENOUS

## 2018-03-01 MED ORDER — SENNOSIDES-DOCUSATE SODIUM 8.6-50 MG PO TABS: 1.0000 | ORAL_TABLET | Freq: Every evening | ORAL | Status: DC | PRN

## 2018-03-01 MED ORDER — DOCUSATE SODIUM 100 MG PO CAPS
100.0000 mg | ORAL_CAPSULE | Freq: Two times a day (BID) | ORAL | Status: DC
  Administered 2018-03-02 – 2018-03-04 (×5): 100 mg via ORAL
  Filled 2018-03-01 (×5): qty 1

## 2018-03-01 MED ORDER — ONDANSETRON HCL 4 MG/2ML IJ SOLN: 4.0000 mg | Freq: Once | INTRAMUSCULAR | Status: DC | PRN

## 2018-03-01 MED ORDER — DORZOLAMIDE HCL-TIMOLOL MAL 2-0.5 % OP SOLN
1.0000 [drp] | Freq: Two times a day (BID) | OPHTHALMIC | Status: DC
  Administered 2018-03-02 – 2018-03-04 (×5): 1 [drp] via OPHTHALMIC
  Filled 2018-03-01: qty 10

## 2018-03-01 MED ORDER — ONDANSETRON HCL 4 MG/2ML IJ SOLN
INTRAMUSCULAR | Status: DC | PRN
  Administered 2018-03-01: 4 mg via INTRAVENOUS

## 2018-03-01 MED ORDER — PAROXETINE HCL 10 MG PO TABS
5.0000 mg | ORAL_TABLET | Freq: Every day | ORAL | Status: DC
  Administered 2018-03-01 – 2018-03-04 (×4): 5 mg via ORAL
  Filled 2018-03-01 (×4): qty 0.5

## 2018-03-01 MED ORDER — ADULT MULTIVITAMIN W/MINERALS CH
1.0000 | ORAL_TABLET | Freq: Every day | ORAL | Status: DC
  Administered 2018-03-02 – 2018-03-04 (×3): 1 via ORAL
  Filled 2018-03-01 (×3): qty 1

## 2018-03-01 MED ORDER — HYDROMORPHONE HCL 1 MG/ML IJ SOLN: 0.2500 mg | INTRAMUSCULAR | Status: DC | PRN

## 2018-03-01 MED ORDER — CALCIUM CARBONATE-VITAMIN D 500-200 MG-UNIT PO TABS
1.0000 | ORAL_TABLET | Freq: Two times a day (BID) | ORAL | Status: DC
  Administered 2018-03-02 – 2018-03-04 (×3): 1 via ORAL
  Filled 2018-03-01 (×5): qty 1

## 2018-03-01 MED ORDER — ENOXAPARIN SODIUM 40 MG/0.4ML ~~LOC~~ SOLN
40.0000 mg | SUBCUTANEOUS | Status: DC
  Administered 2018-03-02 – 2018-03-04 (×3): 40 mg via SUBCUTANEOUS
  Filled 2018-03-01 (×3): qty 0.4

## 2018-03-01 MED ORDER — NEOMYCIN-POLYMYXIN B GU 40-200000 IR SOLN
Status: AC
  Filled 2018-03-01: qty 20

## 2018-03-01 MED ORDER — SODIUM CHLORIDE 0.9 % IV SOLN
INTRAVENOUS | Status: DC
  Administered 2018-03-01: 10:00:00 via INTRAVENOUS

## 2018-03-01 MED ORDER — FENTANYL CITRATE (PF) 100 MCG/2ML IJ SOLN: 25.0000 ug | INTRAMUSCULAR | Status: DC | PRN

## 2018-03-01 MED ORDER — TRANEXAMIC ACID 1000 MG/10ML IV SOLN
INTRAVENOUS | Status: DC | PRN
  Administered 2018-03-01: 1000 mg via TOPICAL

## 2018-03-01 MED ORDER — ACETAMINOPHEN 325 MG PO TABS: 650.0000 mg | ORAL_TABLET | Freq: Four times a day (QID) | ORAL | Status: DC | PRN

## 2018-03-01 MED ORDER — PROPOFOL 10 MG/ML IV BOLUS
INTRAVENOUS | Status: DC | PRN
  Administered 2018-03-01: 110 mg via INTRAVENOUS

## 2018-03-01 MED ORDER — OXYCODONE HCL 5 MG PO TABS
5.0000 mg | ORAL_TABLET | ORAL | Status: DC | PRN
  Administered 2018-03-01 – 2018-03-03 (×4): 5 mg via ORAL
  Filled 2018-03-01 (×2): qty 1
  Filled 2018-03-01 (×2): qty 2

## 2018-03-01 MED ORDER — LEVOTHYROXINE SODIUM 25 MCG PO TABS
25.0000 ug | ORAL_TABLET | Freq: Every day | ORAL | Status: DC
  Administered 2018-03-04: 25 ug via ORAL
  Filled 2018-03-01 (×3): qty 1

## 2018-03-01 MED ORDER — AMLODIPINE BESYLATE 5 MG PO TABS
5.0000 mg | ORAL_TABLET | Freq: Every day | ORAL | Status: DC
Start: 2018-03-01 — End: 2018-03-02
  Administered 2018-03-01 – 2018-03-02 (×2): 5 mg via ORAL
  Filled 2018-03-01 (×2): qty 1

## 2018-03-01 MED ORDER — MUPIROCIN 2 % EX OINT
1.0000 "application " | TOPICAL_OINTMENT | Freq: Two times a day (BID) | CUTANEOUS | Status: DC
  Administered 2018-03-01 – 2018-03-02 (×3): 1 via NASAL
  Filled 2018-03-01: qty 22

## 2018-03-01 MED ORDER — SODIUM CHLORIDE 0.9 % IV SOLN
INTRAVENOUS | Status: DC
  Administered 2018-03-01: 21:00:00 via INTRAVENOUS

## 2018-03-01 MED ORDER — HYDRALAZINE HCL 25 MG PO TABS
25.0000 mg | ORAL_TABLET | Freq: Three times a day (TID) | ORAL | Status: DC
  Administered 2018-03-01 – 2018-03-04 (×8): 25 mg via ORAL
  Filled 2018-03-01 (×9): qty 1

## 2018-03-01 MED ORDER — MORPHINE SULFATE (PF) 2 MG/ML IV SOLN
2.0000 mg | INTRAVENOUS | Status: DC | PRN
  Administered 2018-03-01 (×2): 2 mg via INTRAVENOUS
  Filled 2018-03-01 (×2): qty 1

## 2018-03-01 MED ORDER — METOCLOPRAMIDE HCL 5 MG/ML IJ SOLN: 5.0000 mg | Freq: Three times a day (TID) | INTRAMUSCULAR | Status: DC | PRN

## 2018-03-01 MED ORDER — ROCURONIUM BROMIDE 100 MG/10ML IV SOLN
INTRAVENOUS | Status: DC | PRN
  Administered 2018-03-01: 30 mg via INTRAVENOUS
  Administered 2018-03-01: 20 mg via INTRAVENOUS

## 2018-03-01 MED ORDER — MAGNESIUM HYDROXIDE 400 MG/5ML PO SUSP
30.0000 mL | Freq: Every day | ORAL | Status: DC | PRN
  Administered 2018-03-04: 30 mL via ORAL
  Filled 2018-03-01: qty 30

## 2018-03-01 MED ORDER — ACETAMINOPHEN 325 MG PO TABS
325.0000 mg | ORAL_TABLET | Freq: Four times a day (QID) | ORAL | Status: DC | PRN
  Administered 2018-03-02 – 2018-03-04 (×4): 650 mg via ORAL
  Filled 2018-03-01 (×4): qty 2

## 2018-03-01 MED ORDER — ACETAMINOPHEN 10 MG/ML IV SOLN
INTRAVENOUS | Status: DC | PRN
  Administered 2018-03-01: 1000 mg via INTRAVENOUS

## 2018-03-01 MED ORDER — SUCCINYLCHOLINE CHLORIDE 20 MG/ML IJ SOLN
INTRAMUSCULAR | Status: DC | PRN
  Administered 2018-03-01: 100 mg via INTRAVENOUS

## 2018-03-01 MED ORDER — LEVOFLOXACIN IN D5W 500 MG/100ML IV SOLN
500.0000 mg | Freq: Once | INTRAVENOUS | Status: AC
  Administered 2018-03-01: 500 mg via INTRAVENOUS
  Filled 2018-03-01: qty 100

## 2018-03-01 SURGICAL SUPPLY — 67 items
BAG DECANTER FOR FLEXI CONT (MISCELLANEOUS) IMPLANT
BLADE SAGITTAL WIDE XTHICK NO (BLADE) ×3 IMPLANT
BLADE SURG SZ20 CARB STEEL (BLADE) ×3 IMPLANT
BNDG COHESIVE 6X5 TAN STRL LF (GAUZE/BANDAGES/DRESSINGS) ×3 IMPLANT
BOWL CEMENT MIXING ADV NOZZLE (MISCELLANEOUS) IMPLANT
CANISTER SUCT 1200ML W/VALVE (MISCELLANEOUS) ×3 IMPLANT
CANISTER SUCT 3000ML PPV (MISCELLANEOUS) ×6 IMPLANT
CHLORAPREP W/TINT 26ML (MISCELLANEOUS) ×6 IMPLANT
COVER WAND RF STERILE (DRAPES) ×3 IMPLANT
DECANTER SPIKE VIAL GLASS SM (MISCELLANEOUS) ×6 IMPLANT
DRAPE IMP U-DRAPE 54X76 (DRAPES) ×6 IMPLANT
DRAPE INCISE IOBAN 66X60 STRL (DRAPES) ×3 IMPLANT
DRAPE SHEET LG 3/4 BI-LAMINATE (DRAPES) ×5 IMPLANT
DRAPE SURG 17X11 SM STRL (DRAPES) ×3 IMPLANT
DRAPE SURG 17X23 STRL (DRAPES) ×3 IMPLANT
DRESSING ALLEVYN LIFE SACRUM (GAUZE/BANDAGES/DRESSINGS) ×2 IMPLANT
DRSG OPSITE POSTOP 4X10 (GAUZE/BANDAGES/DRESSINGS) ×2 IMPLANT
DRSG OPSITE POSTOP 4X12 (GAUZE/BANDAGES/DRESSINGS) ×3 IMPLANT
DRSG OPSITE POSTOP 4X14 (GAUZE/BANDAGES/DRESSINGS) ×3 IMPLANT
ELECT BLADE 6.5 EXT (BLADE) ×3 IMPLANT
ELECT CAUTERY BLADE 6.4 (BLADE) ×3 IMPLANT
ELECT REM PT RETURN 9FT ADLT (ELECTROSURGICAL) ×3
ELECTRODE REM PT RTRN 9FT ADLT (ELECTROSURGICAL) ×1 IMPLANT
GAUZE PACK 2X3YD (MISCELLANEOUS) IMPLANT
GLOVE BIO SURGEON STRL SZ8 (GLOVE) ×12 IMPLANT
GLOVE INDICATOR 8.0 STRL GRN (GLOVE) ×3 IMPLANT
GOWN STRL REUS W/ TWL LRG LVL3 (GOWN DISPOSABLE) ×1 IMPLANT
GOWN STRL REUS W/ TWL XL LVL3 (GOWN DISPOSABLE) ×1 IMPLANT
GOWN STRL REUS W/TWL LRG LVL3 (GOWN DISPOSABLE) ×3
GOWN STRL REUS W/TWL XL LVL3 (GOWN DISPOSABLE) ×3
HEAD ENDO II MOD SZ 46 (Orthopedic Implant) ×2 IMPLANT
HOOD PEEL AWAY FLYTE STAYCOOL (MISCELLANEOUS) ×6 IMPLANT
INSERT TAPER ENDO II STD (Orthopedic Implant) ×2 IMPLANT
IV NS 100ML SINGLE PACK (IV SOLUTION) IMPLANT
LABEL OR SOLS (LABEL) ×3 IMPLANT
MAT ABSORB  FLUID 56X50 GRAY (MISCELLANEOUS)
MAT ABSORB FLUID 56X50 GRAY (MISCELLANEOUS) ×1 IMPLANT
NDL FILTER BLUNT 18X1 1/2 (NEEDLE) ×1 IMPLANT
NDL SAFETY ECLIPSE 18X1.5 (NEEDLE) ×1 IMPLANT
NDL SPNL 20GX3.5 QUINCKE YW (NEEDLE) ×1 IMPLANT
NEEDLE FILTER BLUNT 18X 1/2SAF (NEEDLE) ×2
NEEDLE FILTER BLUNT 18X1 1/2 (NEEDLE) ×1 IMPLANT
NEEDLE HYPO 18GX1.5 SHARP (NEEDLE) ×3
NEEDLE SPNL 20GX3.5 QUINCKE YW (NEEDLE) ×3 IMPLANT
NS IRRIG 1000ML POUR BTL (IV SOLUTION) ×3 IMPLANT
PACK HIP PROSTHESIS (MISCELLANEOUS) ×3 IMPLANT
PILLOW ABDUCTION HIP (SOFTGOODS) ×2 IMPLANT
PULSAVAC PLUS IRRIG FAN TIP (DISPOSABLE) ×3
SOL .9 NS 3000ML IRR  AL (IV SOLUTION) ×4
SOL .9 NS 3000ML IRR AL (IV SOLUTION) ×2
SOL .9 NS 3000ML IRR UROMATIC (IV SOLUTION) ×2 IMPLANT
STAPLER SKIN PROX 35W (STAPLE) ×3 IMPLANT
STEM COLLARLESS RED 14X150MM (Stem) ×2 IMPLANT
STRAP SAFETY 5IN WIDE (MISCELLANEOUS) ×3 IMPLANT
SUT ETHIBOND 2 V 37 (SUTURE) ×9 IMPLANT
SUT VIC AB 1 CT1 36 (SUTURE) ×6 IMPLANT
SUT VIC AB 2-0 CT1 (SUTURE) ×15 IMPLANT
SUT VIC AB 2-0 CT1 27 (SUTURE) ×9
SUT VIC AB 2-0 CT1 TAPERPNT 27 (SUTURE) ×3 IMPLANT
SUT VICRYL 1-0 27IN ABS (SUTURE) ×6
SUTURE VICRYL 1-0 27IN ABS (SUTURE) ×2 IMPLANT
SYR 10ML LL (SYRINGE) ×3 IMPLANT
SYR 30ML LL (SYRINGE) ×9 IMPLANT
SYR TB 1ML 27GX1/2 LL (SYRINGE) IMPLANT
TAPE TRANSPORE STRL 2 31045 (GAUZE/BANDAGES/DRESSINGS) ×3 IMPLANT
TIP BRUSH PULSAVAC PLUS 24.33 (MISCELLANEOUS) ×3 IMPLANT
TIP FAN IRRIG PULSAVAC PLUS (DISPOSABLE) ×1 IMPLANT

## 2018-03-01 NOTE — Anesthesia Procedure Notes (Signed)
Procedure Name: Intubation Performed by: Lance Muss, CRNA Pre-anesthesia Checklist: Patient identified, Patient being monitored, Timeout performed, Emergency Drugs available and Suction available Patient Re-evaluated:Patient Re-evaluated prior to induction Oxygen Delivery Method: Circle system utilized Preoxygenation: Pre-oxygenation with 100% oxygen Induction Type: IV induction Ventilation: Mask ventilation without difficulty Laryngoscope Size: 3 and McGraph Grade View: Grade I Tube type: Oral Tube size: 7.0 mm Number of attempts: 1 Airway Equipment and Method: Stylet Placement Confirmation: ETT inserted through vocal cords under direct vision,  positive ETCO2 and breath sounds checked- equal and bilateral Secured at: 21 cm Tube secured with: Tape Dental Injury: Teeth and Oropharynx as per pre-operative assessment

## 2018-03-01 NOTE — Care Management (Signed)
RNCM attempted to visit with patient but she was resting. Home health agency list left on bedside table.

## 2018-03-01 NOTE — ED Triage Notes (Addendum)
Pt arrived via [facility] from home with c/o fall. EMS states that pt had mechanical fall and fell backwards onto butt. EMS states that pt is hurting on R hip. EMS states no LOC.  Pt has pulses palpable on both feet.

## 2018-03-01 NOTE — Consult Note (Signed)
Reason for Consult: Preoperative clearance fracture hip , history of CVA Referring Physician: Dr. Roque Lias primary Dr. Estanislado Pandy hospitalist  RENNEE Neal is an 84 y.o. female.  HPI: 84 year old female reportedly had a mechanical fall denies any lightheaded dizziness or syncope.  Had a previous hip fracture on the left that she knew she fractured a hip on the right she was trying to go to the bathroom at the time and lost her balance.  Patient complains of right-sided hip pain denies palpitations tachycardia no previous cardiac history patient still fairly active and independent living now preop for hip surgery needing cardiology clearance  Past Medical History:  Diagnosis Date  . Arthritis    hips  . Breast cancer ([place])   . Depression   . Hypertension   . Hypothyroidism   . Migraines    none in 8-10 yrs  . Wears hearing aid    bilateral    Past Surgical History:  Procedure Laterality Date  . BREAST LUMPECTOMY    . CATARACT EXTRACTION W/PHACO Left 06/21/2015   Procedure: CATARACT EXTRACTION PHACO AND INTRAOCULAR LENS PLACEMENT (IOC);  Surgeon: Ronnell Freshwater, MD;  Location: [facility];  Service: Ophthalmology;  Laterality: Left;  . CATARACT EXTRACTION W/PHACO Right 07/12/2015   Procedure: CATARACT EXTRACTION PHACO AND INTRAOCULAR LENS PLACEMENT ([place]) right;  Surgeon: Ronnell Freshwater, MD;  Location: [facility];  Service: Ophthalmology;  Laterality: Right;  . JOINT REPLACEMENT Left    total hip    History reviewed. No pertinent family history.  Social History:  reports that she has never smoked. She has never used smokeless tobacco. She reports that she does not drink alcohol or use drugs.  Allergies:  Allergies  Allergen Reactions  . Lincocin [Lincomycin Hcl] Swelling    angioedema  . Codeine     Severe headache  . Hydrocodone Nausea Only and Other (See Comments)    fainted  . Metronidazole Itching    severe  . Penicillins Swelling  .  Tramadol Nausea And Vomiting  . Betadine [Povidone Iodine] Rash    Itching  . Iodine Rash    itching    Medications: I have reviewed the patient's current medications.  Results for orders placed or performed during the hospital encounter of 03/01/18 (from the past 48 hour(s))  CBC     Status: Abnormal   Collection Time: 03/01/18  6:19 AM  Result Value Ref Range   WBC 15.6 (H) 4.0 - 10.5 K/uL   RBC 4.67 3.87 - 5.11 MIL/uL   Hemoglobin 14.7 12.0 - 15.0 g/dL   HCT 45.0 36.0 - 46.0 %   MCV 96.4 80.0 - 100.0 fL   MCH 31.5 26.0 - 34.0 pg   MCHC 32.7 30.0 - 36.0 g/dL   RDW 12.6 11.5 - 15.5 %   Platelets 197 150 - 400 K/uL   nRBC 0.0 0.0 - 0.2 %    Comment: Performed at [facility], [address] [place], [place] [address]  Comprehensive metabolic panel     Status: Abnormal   Collection Time: 03/01/18  6:19 AM  Result Value Ref Range   Sodium 141 135 - 145 mmol/L   Potassium 3.6 3.5 - 5.1 mmol/L   Chloride 106 98 - 111 mmol/L   CO2 25 22 - 32 mmol/L   Glucose, Bld 129 (H) 70 - 99 mg/dL   BUN 18 8 - 23 mg/dL   Creatinine, Ser 0.74 0.44 - 1.00 mg/dL   Calcium 9.1 8.9 -  10.3 mg/dL   Total Protein 6.8 6.5 - 8.1 g/dL   Albumin 4.0 3.5 - 5.0 g/dL   AST 26 15 - 41 U/L   ALT 18 0 - 44 U/L   Alkaline Phosphatase 63 38 - 126 U/L   Total Bilirubin 0.8 0.3 - 1.2 mg/dL   GFR calc non Af Amer >60 >60 mL/min   GFR calc Af Amer >60 >60 mL/min   Anion gap 10 5 - 15    Comment: Performed at [facility], [address] [place], [place] [address]  Protime-INR     Status: None   Collection Time: 03/01/18  6:19 AM  Result Value Ref Range   Prothrombin Time 13.3 11.4 - 15.2 seconds   INR 1.02     Comment: Performed at [facility], [address], [place],  [address]    Dg Chest 1 View  Result Date: 03/01/2018 CLINICAL DATA:  Status post fall with hip fracture EXAM: CHEST  1 VIEW COMPARISON:  July 02, 2016 FINDINGS: There is no edema or  consolidation. Heart is borderline enlarged with pulmonary vascularity normal. There is a hiatal hernia. There is aortic atherosclerosis. Bones are diffusely osteoporotic. No pneumothorax. No acute fracture in the thoracic region evident. IMPRESSION: No edema or consolidation. Stable cardiac silhouette. Hiatal hernia present. Aortic atherosclerosis present. Bones osteoporotic. Aortic Atherosclerosis (ICD10-I70.0). Electronically Signed   By: Lowella Grip III M.D.   On: 03/01/2018 07:12   Dg Hip Unilat W Or Wo Pelvis 2-3 Views Right  Result Date: 03/01/2018 CLINICAL DATA:  Pain following fall EXAM: DG HIP (WITH OR WITHOUT PELVIS) 2-3V RIGHT COMPARISON:  None. FINDINGS: Frontal pelvis as well as frontal and lateral right hip images were obtained. There is a subcapital femoral neck fracture on the right with varus angulation at the fracture site and mild impaction at the fracture site. No other acute fracture is evident. There are old healed fractures of each medial ischium. There is a total hip replacement on the left with prosthetic components well-seated. Bones are osteoporotic. There is moderately severe narrowing of the right hip joint. No erosive changes. IMPRESSION: Subcapital femoral neck fracture on the right with impaction at the fracture site and varus angulation at the fracture site. No other acute fracture. Old healed fractures of each medial ischium with remodeling. Total hip replacement on the left with prosthetic components well-seated. Moderately severe narrowing right hip joint. Bones diffusely osteoporotic. Electronically Signed   By: Lowella Grip III M.D.   On: 03/01/2018 07:11    Review of Systems  Constitutional: Positive for malaise/fatigue.  HENT: Positive for hearing loss.   Eyes: Negative.   Respiratory: Negative.   Cardiovascular: Negative.   Gastrointestinal: Negative.   Genitourinary: Negative.   Musculoskeletal: Positive for falls and joint pain.  Skin: Negative.    Neurological: Positive for weakness.  Endo/Heme/Allergies: Negative.   Psychiatric/Behavioral: Negative.    Blood pressure (!) 146/63, pulse 72, temperature (!) 97.5 F (36.4 C), temperature source Oral, resp. rate 14, height 5\' 5"  (1.651 m), weight 77.1 kg, SpO2 95 %. Physical Exam  Nursing note and vitals reviewed. Constitutional: She is oriented to person, place, and time. She appears well-developed and well-nourished.  HENT:  Head: Normocephalic and atraumatic.  Eyes: Pupils are equal, round, and reactive to light. Conjunctivae and EOM are normal.  Neck: Normal range of motion. Neck supple.  Cardiovascular: Normal rate, regular rhythm and normal heart sounds.  Respiratory: Effort normal and breath sounds normal.  GI: Soft. Bowel sounds are normal.  Musculoskeletal: She exhibits tenderness and deformity.  Neurological: She is alert and oriented to person, place, and time. She has normal reflexes.  Skin: Skin is warm and dry.  Psychiatric: She has a normal mood and affect.    Assessment/Plan: Preop for hip surgery History of CVA Hypertension Hypothyroidism Depression DJD Hearing loss Hyperlipidemia . Plan Patient appears to be an acceptable surgical risk for hip surgery mild to moderate Not recommend any further interventional procedures to modify her risk Consider holding Plavix to reduce risk of bleeding Continue blood pressure control with amlodipine hydralazine Recommend DVT prophylaxis Refer the patient to physical therapy post procedure  Shawntrice Salle D Revecca Nachtigal 03/01/2018, 10:57 AM

## 2018-03-01 NOTE — Op Note (Signed)
03/01/2018  7:57 PM  Patient:   Destiny Neal  Pre-Op Diagnosis:   Displaced femoral neck fracture, right hip.  Post-Op Diagnosis:   Same.  Procedure:   Right hip unipolar hemiarthroplasty.  Surgeon:   Pascal Lux, MD  Assistant:   Kinnie Feil, RNFA  Anesthesia:   GET  Findings:   As above.  Complications:   None  EBL:   350 cc  Fluids:   800 cc crystalloid  UOP:   650 cc  TT:   None  Drains:   None  Closure:   Staples  Implants:   Biomet press-fit system with a #14 standard offset reduced proximal profile Echo femoral stem, a 46 mm outer diameter shell, and a +0 mm neck adapter.  Brief Clinical Note:   The patient is a 84 year old female who sustained the above-noted injury last evening when she apparently lost her balance and fell onto her right side. She was brought to the emergency room where x-rays demonstrated the above-noted injury. The patient has been cleared medically and presents at this time for definitive management of the injury.  Procedure:   The patient was brought into the operating room and lain in the supine position. After adequate general endotracheal intubation and anesthesia was obtained, the patient was repositioned in the left lateral decubitus position and secured using a lateral hip positioner. The right hip and lower extremity were prepped with ChloraPrep solution before being draped sterilely. Preoperative antibiotics were administered. A timeout was performed to verify the appropriate surgical site before a standard posterior approach to the hip was made through an approximately 4-5 inch incision. The incision was carried down through the subcutaneous tissues to expose the gluteal fascia and proximal end of the iliotibial band. These structures were split the length of the incision and the Charnley self-retaining hip retractor placed. The bursal tissues were swept posteriorly to expose the short external rotators. The anterior border  of the piriformis tendon was identified and this plane developed down through the capsule to enter the joint. Abundant fracture hematoma was suctioned. A flap of tissue was elevated off the posterior aspect of the femoral neck and greater trochanter and retracted posteriorly. This flap included the piriformis tendon, the short external rotators, and the posterior capsule. The femoral head was removed in its entirety, then taken to the back table where it was measured and found to be optimally replicated by a 46 mm head. The appropriate trial head was inserted and found to demonstrate an excellent suction fit.   Attention was directed to the femoral side. The femoral neck was recut 10-12 mm above the lesser trochanter using an oscillating saw. The piriformis fossa was debrided of soft tissues before the intramedullary canal was accessed through this point using a triple step reamer. The canal was reamed sequentially beginning with a #7 tapered reamer and progressing to a #14 tapered reamer. This provided excellent circumferential chatter. A box osteotome was used to establish version before the canal was broached sequentially beginning with a #10 broach and progressing to a #14 broach. This was left in place and several trial reductions performed. The permanent #14 reduced proximal profile femoral stem was impacted into place. A repeat trial reduction was performed using both the -3 mm and +0 mm neck lengths. The +0 mm neck length demonstrated excellent stability both in extension and external rotation as well as with flexion to 90 and internal rotation beyond 70. It also was stable in the position  of sleep. The 46 mm outer diameter shell with the +0 mm neck adapter construct was put together on the back table before being impacted onto the stem of the femoral component. The Morse taper locking mechanism was verified using manual distraction before the head was relocated and the hip placed through a range of  motion with the findings as described above.  The wound was copiously irrigated with bacitracin saline solution via the jet lavage system. The posterior flap was reapproximated to the posterior aspect of the greater trochanter using #2 Tycron interrupted sutures placed through drill holes. Several additional #2 Tycron interrupted sutures were used to reinforce this layer of closure. The iliotibial band was reapproximated using #1 Vicryl interrupted sutures before the gluteal fascia was closed using a running #1 Vicryl suture. At this point, 1 g of transexemic acid in 10 cc of normal saline was injected into the joint to help reduce postoperative bleeding. The subcutaneous tissues were closed in several layers using 2-0 Vicryl interrupted sutures before the skin was closed using staples. A sterile occlusive dressing was applied to the wound before the patient was placed into an abduction wedge pillow. The patient was then rolled back into the supine position on the hospital bed before being awakened and returned to the recovery room in satisfactory condition after tolerating the procedure well.

## 2018-03-01 NOTE — ED Provider Notes (Signed)
[facility] Emergency Department Provider Note    I have reviewed the triage vital signs and the nursing notes.   HISTORY  Chief Complaint Fall    HPI Destiny Neal is a 84 y.o. female with below list of chronic medical conditions presents to the emergency department the EMS from home following accidental fall patient states that she got up to go to the bathroom and attempted to do so without her walker which she uses at baseline.  Patient states as a result she lost balance falling with resultant right hip pain.  Patient was given 100 mcg of fentanyl by EMS.  Patient states current pain score is 0 out of 10 however pain worsened with any movement of the right hip.   Past Medical History:  Diagnosis Date  . Arthritis    hips  . Breast cancer ([place])   . Depression   . Hypertension   . Hypothyroidism   . Migraines    none in 8-10 yrs  . Wears hearing aid    bilateral    Patient Active Problem List   Diagnosis Date Noted  . CVA (cerebral vascular accident) ([place]) 07/04/2016  . TIA (transient ischemic attack) 07/02/2016    Past Surgical History:  Procedure Laterality Date  . BREAST LUMPECTOMY    . CATARACT EXTRACTION W/PHACO Left 06/21/2015   Procedure: CATARACT EXTRACTION PHACO AND INTRAOCULAR LENS PLACEMENT (IOC);  Surgeon: Ronnell Freshwater, MD;  Location: [place];  Service: Ophthalmology;  Laterality: Left;  . CATARACT EXTRACTION W/PHACO Right 07/12/2015   Procedure: CATARACT EXTRACTION PHACO AND INTRAOCULAR LENS PLACEMENT ([place]) right;  Surgeon: Ronnell Freshwater, MD;  Location: [place];  Service: Ophthalmology;  Laterality: Right;  . JOINT REPLACEMENT Left    total hip    Prior to Admission medications   Medication Sig Start Date End Date Taking? Authorizing Provider  amLODipine (NORVASC) 5 MG tablet Take 1 tablet (5 mg total) by mouth daily. 07/05/16   Epifanio Lesches, MD  aspirin 81 MG tablet  Take 81 mg by mouth daily.    [provider]  Calcium Citrate-Vitamin D (CALCIUM + D PO) Take 1 tablet by mouth 2 (two) times daily.     [provider]  clopidogrel (PLAVIX) 75 MG tablet Take 1 tablet (75 mg total) by mouth daily. 07/05/16   Epifanio Lesches, MD  cyanocobalamin (,VITAMIN B-12,) 1000 MCG/ML injection Inject 1 mL into the muscle every 30 (thirty) days. 02/23/18   [provider]  Cyanocobalamin (B-12 IJ) Inject 1,000 Units as directed every 30 (thirty) days.    [provider]  docusate sodium (COLACE) 100 MG capsule Take 1 capsule (100 mg total) by mouth daily. 07/06/16   Hillary Bow, MD  dorzolamide-timolol (COSOPT) 22.3-6.8 MG/ML ophthalmic solution Place 1 drop into both eyes 2 (two) times daily.    [provider]  gabapentin (NEURONTIN) 100 MG capsule Take 100 mg by mouth 2 (two) times daily. 02/25/18   [provider]  hydrALAZINE (APRESOLINE) 25 MG tablet Take 1 tablet (25 mg total) by mouth 3 (three) times daily. 07/05/16   Epifanio Lesches, MD  latanoprost (XALATAN) 0.005 % ophthalmic solution Place 1 drop into both eyes at bedtime.    [provider]  levothyroxine (SYNTHROID, LEVOTHROID) 25 MCG tablet Take 25 mcg by mouth daily before breakfast.    [provider]  Multiple Vitamin (MULTIVITAMIN) capsule Take 1 capsule by mouth daily.    [provider]  Nutritional Supplements (GRAPESEED EXTRACT PO) Take 100 mg by mouth daily.     [provider]  PARoxetine (PAXIL) 10 MG tablet Take 5 mg by mouth daily.    [provider]  rosuvastatin (CRESTOR) 5 MG tablet Take 1 tablet (5 mg total) by mouth daily at 6 PM. 07/05/16   Epifanio Lesches, MD  valACYclovir (VALTREX) 1000 MG tablet Take 1 tablet (1,000 mg total) by mouth 2 (two) times daily. 07/06/16   Hillary Bow, MD    Allergies Lincocin [lincomycin hcl]; Codeine; Hydrocodone; Metronidazole; Penicillins; Tramadol;  Betadine [povidone iodine]; and Iodine  History reviewed. No pertinent family history.  Social History Social History   Tobacco Use  . Smoking status: Never Smoker  . Smokeless tobacco: Never Used  Substance Use Topics  . Alcohol use: No  . Drug use: Not on file    Review of Systems Constitutional: No fever/chills Eyes: No visual changes. ENT: No sore throat. Cardiovascular: Denies chest pain. Respiratory: Denies shortness of breath. Gastrointestinal: No abdominal pain.  No nausea, no vomiting.  No diarrhea.  No constipation. Genitourinary: Negative for dysuria. Musculoskeletal: Negative for neck pain.  Negative for back pain.  Positive for right hip pain  integumentary: Negative for rash. Neurological: Negative for headaches, focal weakness or numbness.   ____________________________________________   PHYSICAL EXAM:  VITAL SIGNS: ED Triage Vitals  Enc Vitals Group     BP 03/01/18 0613 (!) 156/88     Pulse Rate 03/01/18 0613 82     Resp 03/01/18 0613 20     Temp --      Temp src --      SpO2 03/01/18 0613 96 %     Weight 03/01/18 0615 77.1 kg (170 lb)     Height 03/01/18 0615 1.651 m (5\' 5" )     Head Circumference --      Peak Flow --      Pain Score 03/01/18 0615 0     Pain Loc --      Pain Edu? --      Excl. in [place]? --     Constitutional: Alert and oriented. Well appearing and in no acute distress. Eyes: Conjunctivae are normal. PERRL. EOMI. Head: Atraumatic. Mouth/Throat: Mucous membranes are moist.  Oropharynx non-erythematous. Neck: No stridor.   Cardiovascular: Normal rate, regular rhythm. Good peripheral circulation. Grossly normal heart sounds. Respiratory: Normal respiratory effort.  No retractions. Lungs CTAB. Gastrointestinal: Soft and nontender. No distention.  Musculoskeletal: Right hip pain with very gentle palpation.  Right leg shortening with external rotation noted. Neurologic:  Normal speech and language. No gross focal neurologic deficits  are appreciated.  Skin:  Skin is warm, dry and intact. No rash noted. Psychiatric: Mood and affect are normal. Speech and behavior are normal.  ____________________________________________   LABS (all labs ordered are listed, but only abnormal results are displayed)  Labs Reviewed  CBC - Abnormal; Notable for the following components:      Result Value   WBC 15.6 (*)    All other components within normal limits  COMPREHENSIVE METABOLIC PANEL - Abnormal; Notable for the following components:   Glucose, Bld 129 (*)    All other components within normal limits  PROTIME-INR   ____________________________________________  EKG  ED ECG REPORT I, Tatamy N Brenden Rudman, the attending physician, personally viewed and interpreted this ECG.   Date: 03/01/2018  EKG Time: 6:16 AM  Rate: 80  Rhythm: Normal sinus rhythm  Axis: Normal  Intervals: Normal  ST&T Change:  None  ____________________________________________  RADIOLOGY I, Delia Ernst Bowler, personally viewed and evaluated these images (plain radiographs) as part of my medical decision making, as well as reviewing the written report by the radiologist.  ED MD interpretation: Right femoral neck fracture   Procedures   ____________________________________________   INITIAL IMPRESSION / ASSESSMENT AND PLAN / ED COURSE  As part of my medical decision making, I reviewed the following data within the electronic MEDICAL RECORD NUMBER   84 year old female presented with above-stated history and physical exam consistent with possible right hip fracture.  X-ray consistent with a right femoral neck fracture.  Patient discussed with Dr. Roland Rack orthopedic surgeon on-call who will admit the patient for further management.  Patient given IV morphine in the emergency department his pain increased following an x-ray. ____________________________________________  FINAL CLINICAL IMPRESSION(S) / ED DIAGNOSES  Final diagnoses:  Closed displaced  fracture of right femoral neck ([place])     MEDICATIONS GIVEN DURING THIS VISIT:  Medications  morphine 2 MG/ML injection 2 mg (has no administration in time range)     ED Discharge Orders    None       Note:  This document was prepared using Dragon voice recognition software and may include unintentional dictation errors.    Gregor Hams, MD 03/01/18 [phone]

## 2018-03-01 NOTE — ED Notes (Signed)
Purwick placed on pt

## 2018-03-01 NOTE — Consult Note (Signed)
ORTHOPAEDIC CONSULTATION  REQUESTING PHYSICIAN: Saundra Shelling, MD  Chief Complaint:   Right hip pain.  History of Present Illness: Destiny Neal is a 84 y.o. female with a history of hypertension, hypothyroidism, depression, and breast cancer who lives independently and is quite active was in her usual state of health until last evening when she apparently lost her balance and fell onto her right side.  She was brought to the emergency room where x-rays demonstrated a varus displaced right femoral neck fracture.  The patient has been admitted in preparation for definitive management of this injury.  The patient denies any associated injuries resulting from the fall.  She did not strike her head or lose consciousness.  In addition, the patient denies any chest pain, shortness of breath, lightheadedness, dizziness, or other symptoms which may have precipitated her fall.  Past Medical History:  Diagnosis Date  . Arthritis    hips  . Breast cancer ([place])   . Depression   . Hypertension   . Hypothyroidism   . Migraines    none in 8-10 yrs  . Wears hearing aid    bilateral   Past Surgical History:  Procedure Laterality Date  . BREAST LUMPECTOMY    . CATARACT EXTRACTION W/PHACO Left 06/21/2015   Procedure: CATARACT EXTRACTION PHACO AND INTRAOCULAR LENS PLACEMENT (IOC);  Surgeon: Ronnell Freshwater, MD;  Location: [facility];  Service: Ophthalmology;  Laterality: Left;  . CATARACT EXTRACTION W/PHACO Right 07/12/2015   Procedure: CATARACT EXTRACTION PHACO AND INTRAOCULAR LENS PLACEMENT ([place]) right;  Surgeon: Ronnell Freshwater, MD;  Location: [facility];  Service: Ophthalmology;  Laterality: Right;  . JOINT REPLACEMENT Left    total hip   Social History   Socioeconomic History  . Marital status: Widowed    Spouse name: Not on file  . Number of children: Not on file  . Years of education: Not  on file  . Highest education level: Not on file  Occupational History  . Occupation: retired  Scientific laboratory technician  . Financial resource strain: Not on file  . Food insecurity:    Worry: Not on file    Inability: Not on file  . Transportation needs:    Medical: Not on file    Non-medical: Not on file  Tobacco Use  . Smoking status: Never Smoker  . Smokeless tobacco: Never Used  Substance and Sexual Activity  . Alcohol use: No  . Drug use: Never  . Sexual activity: Not Currently  Lifestyle  . Physical activity:    Days per week: Not on file    Minutes per session: Not on file  . Stress: Not on file  Relationships  . Social connections:    Talks on phone: Not on file    Gets together: Not on file    Attends religious service: Not on file    Active member of club or organization: Not on file    Attends meetings of clubs or organizations: Not on file    Relationship status: Not on file  Other Topics Concern  . Not on file  Social History Narrative  . Not on file   History reviewed. No pertinent family history. Allergies  Allergen Reactions  . Lincocin [Lincomycin Hcl] Swelling    angioedema  . Codeine     Severe headache  . Hydrocodone Nausea Only and Other (See Comments)    fainted  . Metronidazole Itching    severe  . Penicillins Swelling  . Tramadol Nausea And  Vomiting  . Betadine [Povidone Iodine] Rash    Itching  . Iodine Rash    itching   Prior to Admission medications   Medication Sig Start Date End Date Taking? Authorizing Provider  amLODipine (NORVASC) 5 MG tablet Take 1 tablet (5 mg total) by mouth daily. 07/05/16  Yes Epifanio Lesches, MD  aspirin 81 MG tablet Take 81 mg by mouth daily.   Yes [provider]  clopidogrel (PLAVIX) 75 MG tablet Take 1 tablet (75 mg total) by mouth daily. 07/05/16  Yes Epifanio Lesches, MD  cyanocobalamin (,VITAMIN B-12,) 1000 MCG/ML injection Inject 1 mL into the muscle every 30 (thirty) days. 02/23/18  Yes  [provider]  dorzolamide-timolol (COSOPT) 22.3-6.8 MG/ML ophthalmic solution Place 1 drop into both eyes 2 (two) times daily.   Yes [provider]  hydrALAZINE (APRESOLINE) 25 MG tablet Take 1 tablet (25 mg total) by mouth 3 (three) times daily. 07/05/16  Yes Epifanio Lesches, MD  latanoprost (XALATAN) 0.005 % ophthalmic solution Place 1 drop into both eyes at bedtime.   Yes [provider]  PARoxetine (PAXIL) 10 MG tablet Take 5 mg by mouth daily.   Yes [provider]  rosuvastatin (CRESTOR) 5 MG tablet Take 1 tablet (5 mg total) by mouth daily at 6 PM. 07/05/16  Yes Epifanio Lesches, MD  valACYclovir (VALTREX) 1000 MG tablet Take 1 tablet (1,000 mg total) by mouth 2 (two) times daily. 07/06/16  Yes Hillary Bow, MD  Calcium Citrate-Vitamin D (CALCIUM + D PO) Take 1 tablet by mouth 2 (two) times daily.     [provider]  docusate sodium (COLACE) 100 MG capsule Take 1 capsule (100 mg total) by mouth daily. Patient not taking: Reported on 03/01/2018 07/06/16   Hillary Bow, MD  gabapentin (NEURONTIN) 100 MG capsule Take 100 mg by mouth 2 (two) times daily. 02/25/18   [provider]  levothyroxine (SYNTHROID, LEVOTHROID) 25 MCG tablet Take 25 mcg by mouth daily before breakfast.    [provider]  Multiple Vitamin (MULTIVITAMIN) capsule Take 1 capsule by mouth daily.    [provider]  Nutritional Supplements (GRAPESEED EXTRACT PO) Take 100 mg by mouth daily.     [provider]   Dg Chest 1 View  Result Date: 03/01/2018 CLINICAL DATA:  Status post fall with hip fracture EXAM: CHEST  1 VIEW COMPARISON:  July 02, 2016 FINDINGS: There is no edema or consolidation. Heart is borderline enlarged with pulmonary vascularity normal. There is a hiatal hernia. There is aortic atherosclerosis. Bones are diffusely osteoporotic. No pneumothorax. No acute fracture in the thoracic region evident. IMPRESSION: No edema or  consolidation. Stable cardiac silhouette. Hiatal hernia present. Aortic atherosclerosis present. Bones osteoporotic. Aortic Atherosclerosis (ICD10-I70.0). Electronically Signed   By: Lowella Grip III M.D.   On: 03/01/2018 07:12   Dg Hip Unilat W Or Wo Pelvis 2-3 Views Right  Result Date: 03/01/2018 CLINICAL DATA:  Pain following fall EXAM: DG HIP (WITH OR WITHOUT PELVIS) 2-3V RIGHT COMPARISON:  None. FINDINGS: Frontal pelvis as well as frontal and lateral right hip images were obtained. There is a subcapital femoral neck fracture on the right with varus angulation at the fracture site and mild impaction at the fracture site. No other acute fracture is evident. There are old healed fractures of each medial ischium. There is a total hip replacement on the left with prosthetic components well-seated. Bones are osteoporotic. There is moderately severe narrowing of the right hip joint. No erosive  changes. IMPRESSION: Subcapital femoral neck fracture on the right with impaction at the fracture site and varus angulation at the fracture site. No other acute fracture. Old healed fractures of each medial ischium with remodeling. Total hip replacement on the left with prosthetic components well-seated. Moderately severe narrowing right hip joint. Bones diffusely osteoporotic. Electronically Signed   By: Lowella Grip III M.D.   On: 03/01/2018 07:11    Positive ROS: All other systems have been reviewed and were otherwise negative with the exception of those mentioned in the HPI and as above.  Physical Exam: General:  Alert, no acute distress Psychiatric:  Patient is competent for consent with normal mood and affect   Cardiovascular:  No pedal edema Respiratory:  No wheezing, non-labored breathing GI:  Abdomen is soft and non-tender Skin:  No lesions in the area of chief complaint Neurologic:  Sensation intact distally Lymphatic:  No axillary or cervical lymphadenopathy  Orthopedic Exam:  Orthopedic  examination is limited to the right hip and lower extremity.  The right lower extremity is somewhat shortened and externally rotated as compared to the left.  Skin inspection around the right hip is unremarkable.  There is no swelling, erythema, ecchymosis, abrasions, or other skin abnormalities identified.  She has only mild tenderness to firm palpation of the lateral aspect of the hip.  She has more moderate pain with any attempted active or passive motion of the hip.  She is able to actively dorsiflex plantarflex her toes and ankle.  Sensation is intact light touch to all distributions.  She has good capillary refill to her right foot.  X-rays:  X-rays of the pelvis and right hip are available for review and have been reviewed by myself.  These films demonstrate evidence of a varus displaced right femoral neck fracture.  No significant degenerative changes of the hip joint are noted.  No lytic lesions or other acute bony processes are identified.  Of note, the patient is status post a left hip hemiarthroplasty which appears to be in good position and without evidence of loosening.  Assessment: Displaced right femoral neck fracture.  Plan: The treatment options have been discussed with the patient, including both the surgical and nonsurgical options.  The patient would like to proceed with surgical intervention to include a right hip hemiarthroplasty.  This procedure has been discussed in detail with the patient, as have the potential risks (including bleeding, infection, nerve and/or blood vessel injury, persistent or recurrent pain, loosening of and/or failure of the components, dislocation, leg length inequality, need for further surgery, blood clots, strokes, heart attacks and/or arrhythmias, etc.) and benefits.  The patient states her understanding wishes to proceed.  A formal written consent will be obtained by the nursing staff.  Thank you for asking me to participate in the care of this most  pleasant woman.  I will be happy to follow her with you.   Pascal Lux, MD  Beeper #:  [phone]  03/01/2018 12:48 PM

## 2018-03-01 NOTE — Anesthesia Post-op Follow-up Note (Signed)
Anesthesia QCDR form completed.        

## 2018-03-01 NOTE — Anesthesia Postprocedure Evaluation (Signed)
Anesthesia Post Note  Patient: Destiny Neal  Procedure(s) Performed: ARTHROPLASTY BIPOLAR HIP (HEMIARTHROPLASTY) (Right Hip)  Patient location during evaluation: PACU Anesthesia Type: General Level of consciousness: awake and alert Pain management: pain level controlled Vital Signs Assessment: post-procedure vital signs reviewed and stable Respiratory status: spontaneous breathing and respiratory function stable Cardiovascular status: stable Anesthetic complications: no     Last Vitals:  Vitals:   03/01/18 2035 03/01/18 2040  BP: 129/71 138/66  Pulse: 66 67  Resp: 13 16  Temp: (!) 36.1 C   SpO2: 94% 94%    Last Pain:  Vitals:   03/01/18 2030  TempSrc:   PainSc: 0-No pain                 KEPHART,WILLIAM K

## 2018-03-01 NOTE — Clinical Social Work Note (Signed)
Clinical Social Work Assessment  Patient Details  Name: Destiny Neal MRN: [id number] Date of Birth: 1920-12-26  Date of referral:  03/01/18               Reason for consult:  Facility Placement                Permission sought to share information with:  Chartered certified accountant granted to share information::  Yes, Verbal Permission Granted  Name::      [place]::   [place]   Relationship::     Contact Information:     Housing/Transportation Living arrangements for the past 2 months:  [place] of Information:  Patient Patient Interpreter Needed:  None Criminal Activity/Legal Involvement Pertinent to Current Situation/Hospitalization:  No - Comment as needed Significant Relationships:  Adult Children Lives with:  Self Do you feel safe going back to the place where you live?  Yes Need for family participation in patient care:  Yes (Comment)  Care giving concerns:  Patient lives alone in [place].    Facilities manager / plan:  Holiday representative ([place]) reviewed chart and noted that patient has a hip fracture. Surgery and PT are pending. CSW met with patient and her neighbors Gay Filler and Sonia Side were at bedside. Patient was alert and oriented X4 and was laying in the bed. CSW introduced self and explained role of CSW department. Per patient she lives alone in [place] and has 3 adult children. Per patient her daughter Jeani Hawking lives in [place] and is the closest in distance to her. CSW explained that PT will evaluate her and make a recommendation of home health or SNF. Patient prefers to go to SNF and wants [facility]. CSW explained that [facility] will have to approve SNF. Patient verbalized her understanding. FL2 complete and faxed out. CSW will continue to follow and assist as needed.    Employment status:  Retired Nurse, adult PT Recommendations:  Not assessed at this time Information / Referral  to community resources:  Success  Patient/Family's Response to care:  Patient prefers SNF.   Patient/Family's Understanding of and Emotional Response to Diagnosis, Current Treatment, and Prognosis:  Patient was very pleasant and thanked CSW for assistance.   Emotional Assessment Appearance:  Appears stated age Attitude/Demeanor/Rapport:    Affect (typically observed):  Accepting, Adaptable, Pleasant Orientation:  Oriented to Self, Oriented to Place, Oriented to  Time, Oriented to Situation Alcohol / Substance use:  Not Applicable Psych involvement (Current and /or in the community):  No (Comment)  Discharge Needs  Concerns to be addressed:  Discharge Planning Concerns Readmission within the last 30 days:  No Current discharge risk:  Dependent with Mobility Barriers to Discharge:  Continued Medical Work up   [employer], Veronia Beets, LCSW 03/01/2018, 3:05 PM

## 2018-03-01 NOTE — Anesthesia Preprocedure Evaluation (Signed)
Anesthesia Evaluation  Patient identified by MRN, date of birth, ID band Patient awake    Reviewed: Allergy & Precautions, NPO status , Patient's Chart, lab work & pertinent test results  History of Anesthesia Complications Negative for: history of anesthetic complications  Airway Mallampati: III       Dental   Pulmonary neg sleep apnea, neg COPD,           Cardiovascular hypertension, Pt. on medications (-) Past MI and (-) CHF (-) dysrhythmias (-) Valvular Problems/Murmurs     Neuro/Psych neg Seizures Depression CVA (Balance difficulties, no weakness per pt.)    GI/Hepatic Neg liver ROS, neg GERD  ,  Endo/Other  neg diabetesHypothyroidism (off meds and remaining wnl since stroke)   Renal/GU negative Renal ROS     Musculoskeletal   Abdominal   Peds  Hematology   Anesthesia Other Findings   Reproductive/Obstetrics                             Anesthesia Physical Anesthesia Plan  ASA: III and emergent  Anesthesia Plan: General   Post-op Pain Management:    Induction:   PONV Risk Score and Plan: 3 and Ondansetron, Dexamethasone and Treatment may vary due to age or medical condition  Airway Management Planned: Oral ETT  Additional Equipment:   Intra-op Plan:   Post-operative Plan:   Informed Consent: I have reviewed the patients History and Physical, chart, labs and discussed the procedure including the risks, benefits and alternatives for the proposed anesthesia with the patient or authorized representative who has indicated his/her understanding and acceptance.     Plan Discussed with:   Anesthesia Plan Comments:         Anesthesia Quick Evaluation

## 2018-03-01 NOTE — Transfer of Care (Signed)
Immediate Anesthesia Transfer of Care Note  Patient: Destiny Neal  Procedure(s) Performed: ARTHROPLASTY BIPOLAR HIP (HEMIARTHROPLASTY) (Right Hip)  Patient Location: PACU  Anesthesia Type:General  Level of Consciousness: sedated  Airway & Oxygen Therapy: Patient Spontanous Breathing and Patient connected to face mask oxygen  Post-op Assessment: Post -op Vital signs reviewed and stable  Post vital signs: stable  Last Vitals:  Vitals Value Taken Time  BP 107/49 03/01/2018  7:44 PM  Temp    Pulse 67 03/01/2018  7:46 PM  Resp 15 03/01/2018  7:46 PM  SpO2 95 % 03/01/2018  7:46 PM  Vitals shown include unvalidated device data.  Last Pain:  Vitals:   03/01/18 1114  TempSrc:   PainSc: 7          Complications: No apparent anesthesia complications

## 2018-03-01 NOTE — Clinical Social Work Placement (Signed)
   CLINICAL SOCIAL WORK PLACEMENT  NOTE  Date:  03/01/2018  Patient Details  Name: Destiny Neal MRN: [id number] Date of Birth: 1920/05/12  Clinical Social Work is seeking post-discharge placement for this patient at the [place] level of care (*CSW will initial, date and re-position this form in  chart as items are completed):  Yes   Patient/family provided with [facility] Work Department's list of facilities offering this level of care within the geographic area requested by the patient (or if unable, by the patient's family).  Yes   Patient/family informed of their freedom to choose among providers that offer the needed level of care, that participate in Medicare, Medicaid or managed care program needed by the patient, have an available bed and are willing to accept the patient.  Yes   Patient/family informed of [facility]'s ownership interest in [facility] and [facility], as well as of the fact that they are under no obligation to receive care at these facilities.  PASRR submitted to EDS on       PASRR number received on       Existing PASRR number confirmed on 03/01/18     FL2 transmitted to all facilities in geographic area requested by pt/family on 03/01/18     FL2 transmitted to all facilities within larger geographic area on       Patient informed that his/her managed care company has contracts with or will negotiate with certain facilities, including the following:            Patient/family informed of bed offers received.  Patient chooses bed at       Physician recommends and patient chooses bed at      Patient to be transferred to   on  .  Patient to be transferred to facility by       Patient family notified on   of transfer.  Name of family member notified:        PHYSICIAN       Additional Comment:    _______________________________________________ Cosima Prentiss, Veronia Beets, LCSW 03/01/2018, 3:04 PM

## 2018-03-01 NOTE — H&P (Signed)
[place] [facility] [place] NAME: Destiny Neal    MR#:  [id number]  DATE OF BIRTH:  09/09/1920  DATE OF ADMISSION:  03/01/2018  PRIMARY CARE PHYSICIAN: Katheren Shams   REQUESTING/REFERRING PHYSICIAN:   CHIEF COMPLAINT:   Chief Complaint  Patient presents with  . Fall    HISTORY OF PRESENT ILLNESS: Destiny Neal  is a 84 y.o. female with a known history of breast cancer, arthritis, hypertension, hypothyroidism, decreased hearing presented to the emergency room for fall.  Patient woke up from 4:30 AM at her house she was trying to go to the bathroom.  She lost balance and fell on the right hip.  Patient is aching pain in the right hip 7 out of 10 on a scale of 1-10.  No complaints of any chest pain, shortness of breath.  Patient ambulates with the help of cane and walker at home.  He is pretty active and independent in day-to-day living.  PAST MEDICAL HISTORY:   Past Medical History:  Diagnosis Date  . Arthritis    hips  . Breast cancer ([place])   . Depression   . Hypertension   . Hypothyroidism   . Migraines    none in 8-10 yrs  . Wears hearing aid    bilateral    PAST SURGICAL HISTORY:  Past Surgical History:  Procedure Laterality Date  . BREAST LUMPECTOMY    . CATARACT EXTRACTION W/PHACO Left 06/21/2015   Procedure: CATARACT EXTRACTION PHACO AND INTRAOCULAR LENS PLACEMENT (IOC);  Surgeon: Ronnell Freshwater, MD;  Location: [facility];  Service: Ophthalmology;  Laterality: Left;  . CATARACT EXTRACTION W/PHACO Right 07/12/2015   Procedure: CATARACT EXTRACTION PHACO AND INTRAOCULAR LENS PLACEMENT ([place]) right;  Surgeon: Ronnell Freshwater, MD;  Location: [place];  Service: Ophthalmology;  Laterality: Right;  . JOINT REPLACEMENT Left    total hip    SOCIAL HISTORY:  Social History   Tobacco Use  . Smoking status: Never Smoker  . Smokeless tobacco: Never Used  Substance Use Topics  .  Alcohol use: No    FAMILY HISTORY: History reviewed. No pertinent family history.  DRUG ALLERGIES:  Allergies  Allergen Reactions  . Lincocin [Lincomycin Hcl] Swelling    angioedema  . Codeine     Severe headache  . Hydrocodone Nausea Only and Other (See Comments)    fainted  . Metronidazole Itching    severe  . Penicillins Swelling  . Tramadol Nausea And Vomiting  . Betadine [Povidone Iodine] Rash    Itching  . Iodine Rash    itching    REVIEW OF SYSTEMS:   CONSTITUTIONAL: No fever, fatigue or weakness.  EYES: No blurred or double vision.  EARS, NOSE, AND THROAT: No tinnitus or ear pain.  RESPIRATORY: No cough, shortness of breath, wheezing or hemoptysis.  CARDIOVASCULAR: No chest pain, orthopnea, edema.  GASTROINTESTINAL: No nausea, vomiting, diarrhea or abdominal pain.  GENITOURINARY: No dysuria, hematuria.  ENDOCRINE: No polyuria, nocturia,  HEMATOLOGY: No anemia, easy bruising or bleeding SKIN: No rash or lesion. MUSCULOSKELETAL: Has arthritis Has pain in right hip NEUROLOGIC: No tingling, numbness, weakness.  PSYCHIATRY: No anxiety or depression.   MEDICATIONS AT HOME:  Prior to Admission medications   Medication Sig Start Date End Date Taking? Authorizing Provider  amLODipine (NORVASC) 5 MG tablet Take 1 tablet (5 mg total) by mouth daily. 07/05/16  Yes Epifanio Lesches, MD  aspirin 81 MG tablet Take 81 mg by mouth daily.  Yes [provider]  clopidogrel (PLAVIX) 75 MG tablet Take 1 tablet (75 mg total) by mouth daily. 07/05/16  Yes Epifanio Lesches, MD  cyanocobalamin (,VITAMIN B-12,) 1000 MCG/ML injection Inject 1 mL into the muscle every 30 (thirty) days. 02/23/18  Yes [provider]  dorzolamide-timolol (COSOPT) 22.3-6.8 MG/ML ophthalmic solution Place 1 drop into both eyes 2 (two) times daily.   Yes [provider]  hydrALAZINE (APRESOLINE) 25 MG tablet Take 1 tablet (25 mg total) by mouth 3 (three) times daily. 07/05/16   Yes Epifanio Lesches, MD  latanoprost (XALATAN) 0.005 % ophthalmic solution Place 1 drop into both eyes at bedtime.   Yes [provider]  PARoxetine (PAXIL) 10 MG tablet Take 5 mg by mouth daily.   Yes [provider]  rosuvastatin (CRESTOR) 5 MG tablet Take 1 tablet (5 mg total) by mouth daily at 6 PM. 07/05/16  Yes Epifanio Lesches, MD  valACYclovir (VALTREX) 1000 MG tablet Take 1 tablet (1,000 mg total) by mouth 2 (two) times daily. 07/06/16  Yes Hillary Bow, MD  Calcium Citrate-Vitamin D (CALCIUM + D PO) Take 1 tablet by mouth 2 (two) times daily.     [provider]  docusate sodium (COLACE) 100 MG capsule Take 1 capsule (100 mg total) by mouth daily. Patient not taking: Reported on 03/01/2018 07/06/16   Hillary Bow, MD  gabapentin (NEURONTIN) 100 MG capsule Take 100 mg by mouth 2 (two) times daily. 02/25/18   [provider]  levothyroxine (SYNTHROID, LEVOTHROID) 25 MCG tablet Take 25 mcg by mouth daily before breakfast.    [provider]  Multiple Vitamin (MULTIVITAMIN) capsule Take 1 capsule by mouth daily.    [provider]  Nutritional Supplements (GRAPESEED EXTRACT PO) Take 100 mg by mouth daily.     [provider]      PHYSICAL EXAMINATION:   VITAL SIGNS: Blood pressure (!) 146/63, pulse 72, temperature (!) 97.5 F (36.4 C), temperature source Oral, resp. rate 14, height 5\' 5"  (1.651 m), weight 77.1 kg, SpO2 95 %.  GENERAL:  84 y.o.-year-old patient lying in the bed with no acute distress.  EYES: Pupils equal, round, reactive to light and accommodation. No scleral icterus. Extraocular muscles intact.  HEENT: Head atraumatic, normocephalic. Oropharynx and nasopharynx clear.  NECK:  Supple, no jugular venous distention. No thyroid enlargement, no tenderness.  LUNGS: Normal breath sounds bilaterally, no wheezing, rales,rhonchi or crepitation. No use of accessory muscles of respiration.  CARDIOVASCULAR: S1, S2  normal. No murmurs, rubs, or gallops.  ABDOMEN: Soft, nontender, nondistended. Bowel sounds present. No organomegaly or mass.  EXTREMITIES: No pedal edema, cyanosis, or clubbing.  Has tenderness and swelling in the right hip NEUROLOGIC: Cranial nerves II through XII are intact. Muscle strength 5/5 in all extremities. Sensation intact. Gait not checked.  PSYCHIATRIC: The patient is alert and oriented x 3.  SKIN: No obvious rash, lesion, or ulcer.   LABORATORY PANEL:   CBC Recent Labs  Lab 03/01/18 0619  WBC 15.6*  HGB 14.7  HCT 45.0  PLT 197  MCV 96.4  MCH 31.5  MCHC 32.7  RDW 12.6   ------------------------------------------------------------------------------------------------------------------  Chemistries  Recent Labs  Lab 03/01/18 0619  NA 141  K 3.6  CL 106  CO2 25  GLUCOSE 129*  BUN 18  CREATININE 0.74  CALCIUM 9.1  AST 26  ALT 18  ALKPHOS 63  BILITOT 0.8   ------------------------------------------------------------------------------------------------------------------ estimated creatinine clearance is 43.2 mL/min (by C-G formula based on SCr  of 0.74 mg/dL). ------------------------------------------------------------------------------------------------------------------ No results for input(s): TSH, T4TOTAL, T3FREE, THYROIDAB in the last 72 hours.  Invalid input(s): FREET3   Coagulation profile Recent Labs  Lab 03/01/18 0619  INR 1.02   ------------------------------------------------------------------------------------------------------------------- No results for input(s): DDIMER in the last 72 hours. -------------------------------------------------------------------------------------------------------------------  Cardiac Enzymes No results for input(s): CKMB, TROPONINI, MYOGLOBIN in the last 168 hours.  Invalid input(s):  CK ------------------------------------------------------------------------------------------------------------------ Invalid input(s): POCBNP  ---------------------------------------------------------------------------------------------------------------  Urinalysis    Component Value Date/Time   COLORURINE YELLOW (A) 07/03/2016 0108   APPEARANCEUR CLOUDY (A) 07/03/2016 0108   APPEARANCEUR Clear 05/27/2011 1243   LABSPEC 1.012 07/03/2016 0108   LABSPEC 1.006 05/27/2011 1243   PHURINE 7.0 07/03/2016 0108   GLUCOSEU NEGATIVE 07/03/2016 0108   GLUCOSEU Negative 05/27/2011 1243   HGBUR SMALL (A) 07/03/2016 0108   BILIRUBINUR NEGATIVE 07/03/2016 0108   BILIRUBINUR Negative 05/27/2011 1243   KETONESUR 5 (A) 07/03/2016 0108   PROTEINUR NEGATIVE 07/03/2016 0108   NITRITE NEGATIVE 07/03/2016 0108   LEUKOCYTESUR LARGE (A) 07/03/2016 0108   LEUKOCYTESUR Negative 05/27/2011 1243     RADIOLOGY: Dg Chest 1 View  Result Date: 03/01/2018 CLINICAL DATA:  Status post fall with hip fracture EXAM: CHEST  1 VIEW COMPARISON:  July 02, 2016 FINDINGS: There is no edema or consolidation. Heart is borderline enlarged with pulmonary vascularity normal. There is a hiatal hernia. There is aortic atherosclerosis. Bones are diffusely osteoporotic. No pneumothorax. No acute fracture in the thoracic region evident. IMPRESSION: No edema or consolidation. Stable cardiac silhouette. Hiatal hernia present. Aortic atherosclerosis present. Bones osteoporotic. Aortic Atherosclerosis (ICD10-I70.0). Electronically Signed   By: Lowella Grip III M.D.   On: 03/01/2018 07:12   Dg Hip Unilat W Or Wo Pelvis 2-3 Views Right  Result Date: 03/01/2018 CLINICAL DATA:  Pain following fall EXAM: DG HIP (WITH OR WITHOUT PELVIS) 2-3V RIGHT COMPARISON:  None. FINDINGS: Frontal pelvis as well as frontal and lateral right hip images were obtained. There is a subcapital femoral neck fracture on the right with varus angulation at the  fracture site and mild impaction at the fracture site. No other acute fracture is evident. There are old healed fractures of each medial ischium. There is a total hip replacement on the left with prosthetic components well-seated. Bones are osteoporotic. There is moderately severe narrowing of the right hip joint. No erosive changes. IMPRESSION: Subcapital femoral neck fracture on the right with impaction at the fracture site and varus angulation at the fracture site. No other acute fracture. Old healed fractures of each medial ischium with remodeling. Total hip replacement on the left with prosthetic components well-seated. Moderately severe narrowing right hip joint. Bones diffusely osteoporotic. Electronically Signed   By: Lowella Grip III M.D.   On: 03/01/2018 07:11    EKG: Orders placed or performed during the hospital encounter of 03/01/18  . ED EKG  . ED EKG  . EKG 12-Lead  . EKG 12-Lead  . EKG 12-Lead  . EKG 12-Lead    IMPRESSION AND PLAN: 84 year old female patient with history of decreased hearing, arthritis, hypertension, breast cancer, hypothyroidism presented to the eminence room for fall  -Right hip fracture Orthopedic surgery consultation N.p.o. for now Avoid blood thinner medications  -Mechanical fall and hip fracture Orthopedic surgery evaluation for fixation of the hip fracture  -Leukocytosis IV Levaquin antibiotic Check urinalysis   -DVT prophylaxis sequential compression device to lower extremities  -Hip pain Start patient on oral Percocet and PRN IV morphine.  -Hyperlipidemia Continue rosuvastatin  -Hypertension  Resume oral Norvasc  -Patient going for low risk procedure Medically cleared for surgery.  All the records are reviewed and case discussed with ED provider. Management plans discussed with the patient, family and they are in agreement.  CODE STATUS:Full code    Code Status Orders  (From admission, onward)         Start     Ordered    03/01/18 0930  Full code  Continuous     03/01/18 0930        Code Status History    Date Active Date Inactive Code Status Order ID Comments User Context   07/02/2016 2340 07/06/2016 1732 Full Code [id number]  Harvie Bridge, DO ED    Advance Directive Documentation     Most Recent Value  Type of Advance Directive  Healthcare Power of [place], Living will  Pre-existing out of facility DNR order (yellow form or pink MOST form)  -  "MOST" Form in Place?  -       TOTAL TIME TAKING CARE OF THIS PATIENT: 52 minutes.    Saundra Shelling M.D on 03/01/2018 at 10:20 AM  Between 7am to 6pm - Pager - [phone]  After 6pm go to [email or web address] - password [email or web address] [facility]  [place]  Office  [phone]  CC: Primary care physician; Katheren Shams

## 2018-03-01 NOTE — Progress Notes (Signed)
Advanced care plan.  Purpose of the Encounter: CODE STATUS  Parties in Attendance: Patient  Patient's Decision Capacity: Good  Subjective/Patient's story: Presented to emergency room for fall has hip pain   Objective/Medical story During the fall patient sustained fracture of the hip Needs orthopedic surgery evaluation and surgery to fix the fracture   Goals of care determination:  Advance care directives goals of care and treatment plan discussed Patient wants everything done which includes CPR, intubation ventilator if the need arises   CODE STATUS: Full code   Time spent discussing advanced care planning: 16 minutes

## 2018-03-01 NOTE — NC FL2 (Signed)
[place] LEVEL OF CARE SCREENING TOOL     IDENTIFICATION  Patient Name: Destiny Neal Birthdate: Dec 04, 1920 Sex: female Admission Date (Current Location): 03/01/2018  [place] and [place] Number:  Engineering geologist and Address:  [facility], [address] [place], [place] [address]      Provider Number: [id number]  Attending Physician Name and Address:  Saundra Shelling, MD  Relative Name and Phone Number:       Current Level of Care: Hospital Recommended Level of Care: [place] Prior Approval Number:    Date Approved/Denied:   PASRR Number: [id number])  Discharge Plan: SNF    Current Diagnoses: Patient Active Problem List   Diagnosis Date Noted  . Hip fracture ([place] [facility] [place]) 03/01/2018  . CVA (cerebral vascular accident) ([place]) 07/04/2016  . TIA (transient ischemic attack) 07/02/2016    Orientation RESPIRATION BLADDER Height & Weight     Self, Time, Situation, Place  Normal Continent Weight: 170 lb (77.1 kg) Height:  5\' 5"  (165.1 cm)  BEHAVIORAL SYMPTOMS/MOOD NEUROLOGICAL BOWEL NUTRITION STATUS      Continent Diet(Diet: NPO for surgery to be advanced. )  AMBULATORY STATUS COMMUNICATION OF NEEDS Skin   Extensive Assist Verbally Surgical wounds                       Personal Care Assistance Level of Assistance  Bathing, Feeding, Dressing Bathing Assistance: Limited assistance Feeding assistance: Independent Dressing Assistance: Limited assistance     Functional Limitations Info  Sight, Hearing, Speech Sight Info: Adequate Hearing Info: Impaired Speech Info: Adequate    SPECIAL CARE FACTORS FREQUENCY  PT (By licensed PT), OT (By licensed OT)     PT Frequency: (5) OT Frequency: (5)            Contractures      Additional Factors Info  Code Status, Allergies Code Status Info: (Full Code. ) Allergies Info: (Lincocin Lincomycin Hcl, Codeine, Hydrocodone, Metronidazole, Penicillins,  Tramadol, Betadine Povidone Iodine, Iodine)           Current Medications (03/01/2018):  This is the current hospital active medication list Current Facility-Administered Medications  Medication Dose Route Frequency Provider Last Rate Last Dose  . 0.9 %  sodium chloride infusion   Intravenous Continuous Saundra Shelling, MD 50 mL/hr at 03/01/18 0958    . acetaminophen (TYLENOL) tablet 650 mg  650 mg Oral Q6H PRN Saundra Shelling, MD       Or  . acetaminophen (TYLENOL) suppository 650 mg  650 mg Rectal Q6H PRN Pyreddy, Reatha Harps, MD      . amLODipine (NORVASC) tablet 5 mg  5 mg Oral Daily Pyreddy, Pavan, MD   5 mg at 03/01/18 1015  . calcium-vitamin D (OSCAL WITH D) 500-200 MG-UNIT per tablet 1 tablet  1 tablet Oral BID Pyreddy, Pavan, MD      . ceFAZolin (ANCEF) IVPB 2g/100 mL premix  2 g Intravenous Once Poggi, Marshall Cork, MD      . dorzolamide-timolol (COSOPT) 22.3-6.8 MG/ML ophthalmic solution 1 drop  1 drop Both Eyes BID Pyreddy, Pavan, MD      . gabapentin (NEURONTIN) capsule 100 mg  100 mg Oral BID Pyreddy, Reatha Harps, MD   100 mg at 03/01/18 1015  . hydrALAZINE (APRESOLINE) tablet 25 mg  25 mg Oral TID Saundra Shelling, MD   25 mg at 03/01/18 1015  . latanoprost (XALATAN) 0.005 % ophthalmic solution 1 drop  1 drop Both Eyes QHS Pyreddy, Pavan,  MD      . levothyroxine (SYNTHROID, LEVOTHROID) tablet 25 mcg  25 mcg Oral QAC breakfast Pyreddy, Pavan, MD      . morphine 2 MG/ML injection 2 mg  2 mg Intravenous Q4H PRN Saundra Shelling, MD   2 mg at 03/01/18 1108  . multivitamin with minerals tablet 1 tablet  1 tablet Oral Daily Pyreddy, Pavan, MD      . mupirocin ointment (BACTROBAN) 2 % 1 application  1 application Nasal BID Saundra Shelling, MD   1 application at 93/81/01 1041  . ondansetron (ZOFRAN) tablet 4 mg  4 mg Oral Q6H PRN Pyreddy, Reatha Harps, MD       Or  . ondansetron (ZOFRAN) injection 4 mg  4 mg Intravenous Q6H PRN Pyreddy, Reatha Harps, MD      . oxyCODONE-acetaminophen (PERCOCET/ROXICET) 5-325 MG per  tablet 1 tablet  1 tablet Oral Q4H PRN Saundra Shelling, MD   1 tablet at 03/01/18 1014  . PARoxetine (PAXIL) tablet 5 mg  5 mg Oral Daily Pyreddy, Pavan, MD   5 mg at 03/01/18 1016  . rosuvastatin (CRESTOR) tablet 5 mg  5 mg Oral q1800 Pyreddy, Pavan, MD      . senna-docusate (Senokot-S) tablet 1 tablet  1 tablet Oral QHS PRN Saundra Shelling, MD         Discharge Medications: Please see discharge summary for a list of discharge medications.  Relevant Imaging Results:  Relevant Lab Results:   Additional Information (SSN: [id number])  Reign Dziuba, Veronia Beets, LCSW

## 2018-03-02 ENCOUNTER — Inpatient Hospital Stay: Payer: Medicare Other

## 2018-03-02 ENCOUNTER — Encounter: Payer: Self-pay | Admitting: Surgery

## 2018-03-02 LAB — BASIC METABOLIC PANEL
Anion gap: 7 (ref 5–15)
BUN: 23 mg/dL (ref 8–23)
CO2: 24 mmol/L (ref 22–32)
Calcium: 8.3 mg/dL — ABNORMAL LOW (ref 8.9–10.3)
Chloride: 107 mmol/L (ref 98–111)
Creatinine, Ser: 0.83 mg/dL (ref 0.44–1.00)
GFR calc Af Amer: >60 mL/min (ref >60)
GFR calc non Af Amer: 60 mL/min — ABNORMAL LOW (ref >60)
Glucose, Bld: 178 mg/dL — ABNORMAL HIGH (ref 70–99)
Potassium: 3.9 mmol/L (ref 3.5–5.1)
Sodium: 138 mmol/L (ref 135–145)

## 2018-03-02 LAB — CBC
HCT: 40.1 % (ref 36.0–46.0)
Hemoglobin: 12.9 g/dL (ref 12.0–15.0)
MCH: 31.8 pg (ref 26.0–34.0)
MCHC: 32.2 g/dL (ref 30.0–36.0)
MCV: 98.8 fL (ref 80.0–100.0)
Platelets: 173 10*3/uL (ref 150–400)
RBC: 4.06 MIL/uL (ref 3.87–5.11)
RDW: 12.6 % (ref 11.5–15.5)
WBC: 12.4 10*3/uL — ABNORMAL HIGH (ref 4.0–10.5)
nRBC: 0 % (ref 0.0–0.2)

## 2018-03-02 MED ORDER — AMLODIPINE BESYLATE 5 MG PO TABS: 2.5000 mg | ORAL_TABLET | Freq: Every day | ORAL | Status: DC

## 2018-03-02 MED ORDER — AMLODIPINE BESYLATE 5 MG PO TABS
5.0000 mg | ORAL_TABLET | Freq: Every day | ORAL | Status: DC
  Administered 2018-03-03: 5 mg via ORAL
  Filled 2018-03-02: qty 1

## 2018-03-02 NOTE — Progress Notes (Signed)
[facility] [place] NAME: Keeshia Sanderlin    MR#:  [id number]  DATE OF BIRTH:  1920-09-01  SUBJECTIVE:  CHIEF COMPLAINT:   Chief Complaint  Patient presents with  . Fall  Patient seen and evaluated today Status post right hip hemiarthroplasty Tolerated procedure well Awake and had a breakfast Right hip pain better tolerated  REVIEW OF SYSTEMS:    ROS  CONSTITUTIONAL: No documented fever. No fatigue, weakness. No weight gain, no weight loss.  EYES: No blurry or double vision.  ENT: No tinnitus. No postnasal drip. No redness of the oropharynx.  RESPIRATORY: No cough, no wheeze, no hemoptysis. No dyspnea.  CARDIOVASCULAR: No chest pain. No orthopnea. No palpitations. No syncope.  GASTROINTESTINAL: No nausea, no vomiting or diarrhea. No abdominal pain. No melena or hematochezia.  GENITOURINARY: No dysuria or hematuria.  ENDOCRINE: No polyuria or nocturia. No heat or cold intolerance.  HEMATOLOGY: No anemia. No bruising. No bleeding.  INTEGUMENTARY: No rashes. No lesions.  MUSCULOSKELETAL:. No swelling. No gout.  Has right hip pain  NEUROLOGIC: No numbness, tingling, or ataxia. No seizure-type activity.  PSYCHIATRIC: No anxiety. No insomnia. No ADD.   DRUG ALLERGIES:   Allergies  Allergen Reactions  . Lincocin [Lincomycin Hcl] Swelling    angioedema  . Codeine     Severe headache  . Hydrocodone Nausea Only and Other (See Comments)    fainted  . Metronidazole Itching    severe  . Penicillins Swelling  . Tramadol Nausea And Vomiting  . Betadine [Povidone Iodine] Rash    Itching  . Iodine Rash    itching    VITALS:  Blood pressure (!) 120/57, pulse 72, temperature 97.8 F (36.6 C), temperature source Oral, resp. rate 14, height 5\' 5"  (1.651 m), weight 77.1 kg, SpO2 97 %.  PHYSICAL EXAMINATION:   Physical Exam  GENERAL:  84 y.o.-year-old patient lying in the bed with no acute distress.  EYES: Pupils equal, round,  reactive to light and accommodation. No scleral icterus. Extraocular muscles intact.  HEENT: Head atraumatic, normocephalic. Oropharynx and nasopharynx clear.  NECK:  Supple, no jugular venous distention. No thyroid enlargement, no tenderness.  LUNGS: Normal breath sounds bilaterally, no wheezing, rales, rhonchi. No use of accessory muscles of respiration.  CARDIOVASCULAR: S1, S2 normal. No murmurs, rubs, or gallops.  ABDOMEN: Soft, nontender, nondistended. Bowel sounds present. No organomegaly or mass.  EXTREMITIES: No cyanosis, clubbing or edema b/l.   Bandage noted on the right hip NEUROLOGIC: Cranial nerves II through XII are intact. No focal Motor or sensory deficits b/l.   PSYCHIATRIC: The patient is alert and oriented x 3.  SKIN: No obvious rash, lesion, or ulcer.   LABORATORY PANEL:   CBC Recent Labs  Lab 03/02/18 0355  WBC 12.4*  HGB 12.9  HCT 40.1  PLT 173   ------------------------------------------------------------------------------------------------------------------ Chemistries  Recent Labs  Lab 03/01/18 0619 03/02/18 0355  NA 141 138  K 3.6 3.9  CL 106 107  CO2 25 24  GLUCOSE 129* 178*  BUN 18 23  CREATININE 0.74 0.83  CALCIUM 9.1 8.3*  AST 26  --   ALT 18  --   ALKPHOS 63  --   BILITOT 0.8  --    ------------------------------------------------------------------------------------------------------------------  Cardiac Enzymes No results for input(s): TROPONINI in the last 168 hours. ------------------------------------------------------------------------------------------------------------------  RADIOLOGY:  Dg Chest 1 View  Result Date: 03/01/2018 CLINICAL DATA:  Status post fall with hip fracture EXAM: CHEST  1 VIEW COMPARISON:  July 02, 2016 FINDINGS: There is no edema or consolidation. Heart is borderline enlarged with pulmonary vascularity normal. There is a hiatal hernia. There is aortic atherosclerosis. Bones are diffusely osteoporotic. No  pneumothorax. No acute fracture in the thoracic region evident. IMPRESSION: No edema or consolidation. Stable cardiac silhouette. Hiatal hernia present. Aortic atherosclerosis present. Bones osteoporotic. Aortic Atherosclerosis (ICD10-I70.0). Electronically Signed   By: Lowella Grip III M.D.   On: 03/01/2018 07:12   Dg Ankle Complete Right  Result Date: 03/02/2018 CLINICAL DATA:  Ankle pain EXAM: RIGHT ANKLE - COMPLETE 3+ VIEW COMPARISON:  None. FINDINGS: Bones are osteopenic. Normal alignment without acute osseous finding, fracture, subluxation or dislocation. No soft tissue abnormality. No significant joint abnormality. IMPRESSION: Osteopenia.  No acute finding by plain radiography Electronically Signed   By: Jerilynn Mages.  Shick M.D.   On: 03/02/2018 09:02   Dg Hip Unilat W Or W/o Pelvis 2-3 Views Right  Result Date: 03/01/2018 CLINICAL DATA:  Postop EXAM: DG HIP (WITH OR WITHOUT PELVIS) 2-3V RIGHT COMPARISON:  03/01/2018 FINDINGS: Prior left hip replacement. Interval right hip replacement with intact hardware and normal alignment.Old left pubic fractures. IMPRESSION: S/p right hip replacement with expected surgical changes. Electronically Signed   By: Donavan Foil M.D.   On: 03/01/2018 20:51   Dg Hip Unilat W Or Wo Pelvis 2-3 Views Right  Result Date: 03/01/2018 CLINICAL DATA:  Pain following fall EXAM: DG HIP (WITH OR WITHOUT PELVIS) 2-3V RIGHT COMPARISON:  None. FINDINGS: Frontal pelvis as well as frontal and lateral right hip images were obtained. There is a subcapital femoral neck fracture on the right with varus angulation at the fracture site and mild impaction at the fracture site. No other acute fracture is evident. There are old healed fractures of each medial ischium. There is a total hip replacement on the left with prosthetic components well-seated. Bones are osteoporotic. There is moderately severe narrowing of the right hip joint. No erosive changes. IMPRESSION: Subcapital femoral neck  fracture on the right with impaction at the fracture site and varus angulation at the fracture site. No other acute fracture. Old healed fractures of each medial ischium with remodeling. Total hip replacement on the left with prosthetic components well-seated. Moderately severe narrowing right hip joint. Bones diffusely osteoporotic. Electronically Signed   By: Lowella Grip III M.D.   On: 03/01/2018 07:11     ASSESSMENT AND PLAN:  84 year old female patient with history of decreased hearing, arthritis, hypertension, breast cancer, hypothyroidism currently under hospitalist service for hip fracture  -Right hip fracture status post bipolar hip hemiarthroplasty right side Orthopedic surgery follow-up appreciated Physical therapy evaluation Ankle x-ray did not reveal any fracture  -Leukocytosis improving Urinalysis and chest x-ray did not show any infection  -DVT prophylaxis  Patient started on subcu Lovenox 40 mg daily daily  -Hip pain Continue pain management with morphine IV as needed along with oral Percocet  -Hyperlipidemia Continue rosuvastatin  -Hypertension Resume oral Norvasc  -PRN stool softeners for constipation   All the records are reviewed and case discussed with Care Management/Social Worker. Management plans discussed with the patient, family and they are in agreement.  CODE STATUS: Full code  DVT Prophylaxis: SCDs  TOTAL TIME TAKING CARE OF THIS PATIENT: 35 minutes.   POSSIBLE D/C IN 2 to 3 DAYS, DEPENDING ON CLINICAL CONDITION.  Saundra Shelling M.D on 03/02/2018 at 12:26 PM  Between 7am to 6pm - Pager - [phone]  After 6pm go to [email or web address] - password [email or web address] [place]  [facility]  Office  [phone]  CC: Primary care physician; Katheren Shams  Note: This dictation was prepared with Dragon dictation along with smaller phrase technology. Any transcriptional errors that result from this process are unintentional.

## 2018-03-02 NOTE — Progress Notes (Signed)
This nursed assumed care at 430 pm. Patient assisted up to [facility] after dinner and patient voided dark brown urine. Patient assisted back to bed. No complaints of pain. Dressing dry and intact on left hip. Bed alarm on, lowbed in lowest position, call bell in reach. Continue to monitor.

## 2018-03-02 NOTE — Progress Notes (Signed)
Physical Therapy Treatment Patient Details Name: Destiny Neal MRN: [id number] DOB: 07-06-1920 Today's Date: 03/02/2018    History of Present Illness Pt is a 84 y.o. female with a known history of breast cancer, arthritis, hypertension, hypothyroidism, decreased hearing presented to the emergency room for fall.  Patient woke up at 4:30 AM at her house she was trying to go to the bathroom.  She lost balance and fell on the right hip.  Pt diagnosed with a displaced femoral neck fracture, right hip, and is s/p right hip unipolar hemiarthroplasty.    PT Comments    Pt presents with deficits in strength, transfers, mobility, gait, balance, and activity tolerance.  Pt continued to require extensive assist with bed mobility along with verbal cues for sequencing for hip precaution compliance.  Pt required min A to stand from an elevated EOB again with verbal cues for proper sequencing for precaution compliance.  Pt remained limited as far as amb tolerance this session but presented with decreased RLE buckling when advancing the LLE and overall was more confident and less anxious.  Pt will benefit from PT services in a SNF setting upon discharge to safely address above deficits for decreased caregiver assistance and eventual return to PLOF.     Follow Up Recommendations  SNF;Supervision for mobility/OOB     Equipment Recommendations  None recommended by PT    Recommendations for Other Services       Precautions / Restrictions Precautions Precautions: Posterior Hip;Fall Precaution Booklet Issued: Yes (comment) Precaution Comments: Posterior hip precaution education provided to pt and son Restrictions Weight Bearing Restrictions: Yes RLE Weight Bearing: Weight bearing as tolerated    Mobility  Bed Mobility Overal bed mobility: Needs Assistance Bed Mobility: Supine to Sit     Supine to sit: Mod assist Sit to supine: Mod assist   General bed mobility comments: Mod a for RLE out of  bed and for trunk control and positioning  Transfers Overall transfer level: Needs assistance Equipment used: Rolling walker (2 wheeled) Transfers: Sit to/from Stand Sit to Stand: Min assist;From elevated surface         General transfer comment: Mod verbal cues for sequencing for hand placement and to maintain posterior hip precautions  Ambulation/Gait Ambulation/Gait assistance: Min assist Gait Distance (Feet): 3 Feet Assistive device: Rolling walker (2 wheeled) Gait Pattern/deviations: Step-to pattern;Decreased stance time - right;Antalgic;Decreased step length - left Gait velocity: decreased   General Gait Details: Antalgic on the R hip during ambulation but with noted decrease in R knee buckling during LLE advancement compared to AM session; Pt education and practice provided for proper sequencing during sharp right turns to avoid CKC R hip IR.     Stairs             Wheelchair Mobility    Modified Rankin (Stroke Patients Only)       Balance Overall balance assessment: Mild deficits observed, not formally tested                                          Cognition Arousal/Alertness: Awake/alert Behavior During Therapy: WFL for tasks assessed/performed Overall Cognitive Status: Within Functional Limits for tasks assessed  Exercises Total Joint Exercises Ankle Circles/Pumps: AROM;Both;20 reps Quad Sets: Strengthening;Both;15 reps Gluteal Sets: Strengthening;Both;15 reps Heel Slides: AAROM;5 reps;Right Hip ABduction/ADduction: AAROM;Right;5 reps Long Arc Quad: AROM;Both;15 reps Knee Flexion: AROM;Both;15 reps Other Exercises Other Exercises: HEP education per handout Other Exercises: Posterior hip precaution education with pt and son with handout provided    General Comments        Pertinent Vitals/Pain Pain Assessment: 0-10 Pain Score: 4  Pain Location: R hip Pain Descriptors  / Indicators: Sore Pain Intervention(s): Premedicated before session;Monitored during session    Home Living Family/patient expects to be discharged to:: Private residence Living Arrangements: Alone Available Help at Discharge: Personal care attendant;Family;Available PRN/intermittently;Other (Comment)(PCA 3x/wk in the AM for breakfast, housecleaning, and walks with pt ) Type of Home: House Home Access: Stairs to enter Entrance Stairs-Rails: Right;Left;Can reach both Home Layout: One level Home Equipment: Environmental consultant - 2 wheels;Cane - quad;Bedside commode      Prior Function Level of Independence: Needs assistance  Gait / Transfers Assistance Needed: Mod Ind amb limited community distances with a RW in the home and a QC in the community, 4 falls in the last 6 months including this one d/t "losing my balance" ADL's / Homemaking Assistance Needed: PCA assists with occasional meals and housework     PT Goals (current goals can now be found in the care plan section) Acute Rehab PT Goals Patient Stated Goal: To improve balance PT Goal Formulation: With patient Time For Goal Achievement: 03/15/18 Potential to Achieve Goals: Good Progress towards PT goals: Progressing toward goals    Frequency    BID      PT Plan Current plan remains appropriate    Co-evaluation              AM-PAC PT "6 Clicks" Mobility   Outcome Measure  Help needed turning from your back to your side while in a flat bed without using bedrails?: Total Help needed moving from lying on your back to sitting on the side of a flat bed without using bedrails?: Total Help needed moving to and from a bed to a chair (including a wheelchair)?: Total Help needed standing up from a chair using your arms (e.g., wheelchair or bedside chair)?: A Little Help needed to walk in hospital room?: Total Help needed climbing 3-5 steps with a railing? : Total 6 Click Score: 8    End of Session Equipment Utilized During  Treatment: Gait belt Activity Tolerance: Patient tolerated treatment well Patient left: with call bell/phone within reach;with family/visitor present;in chair;with chair alarm set Nurse Communication: Mobility status;Other (comment)(Pt requires a call bell) PT Visit Diagnosis: Other abnormalities of gait and mobility (R26.89);Muscle weakness (generalized) (M62.81);Pain Pain - Right/Left: Right Pain - part of body: Hip;Ankle and joints of foot     Time: 2119-4174 PT Time Calculation (min) (ACUTE ONLY): 26 min  Charges:  $Therapeutic Exercise: 8-22 mins $Therapeutic Activity: 8-22 mins                    D. Scott Makenzy Krist PT, DPT 03/02/18, 2:48 PM

## 2018-03-02 NOTE — Progress Notes (Signed)
Patient urinary output 200 cc's tonight, Patient also complaining of feeling full. Foley catheter removed and patient placed on bedpan, while rolling the patient it was noted that the patient was lying in a pool of blood. Patient given a bath, clots noted at vaginal opening. No complaint of pain or further discomfort after foley was removed. Daughter at bedside states she was told that bleeding occurred during insertion of foley catheter prior to surgery. MD paged.

## 2018-03-02 NOTE — Progress Notes (Signed)
Pt hasn't voided at this time.  Bladder scan shows approx. 265ml.  MD notified via secure chat. Oncoming RN aware per report.  [id number]

## 2018-03-02 NOTE — Progress Notes (Signed)
MD returned page. Urology consult to be placed by MD

## 2018-03-02 NOTE — Evaluation (Signed)
Physical Therapy Evaluation Patient Details Name: Destiny Neal MRN: [id number] DOB: 11/08/1920 Today's Date: 03/02/2018   History of Present Illness  Pt is a 84 y.o. female with a known history of breast cancer, arthritis, hypertension, hypothyroidism, decreased hearing presented to the emergency room for fall.  Patient woke up at 4:30 AM at her house she was trying to go to the bathroom.  She lost balance and fell on the right hip.  Pt diagnosed with a displaced femoral neck fracture, right hip, and is s/p right hip unipolar hemiarthroplasty.    Clinical Impression  Pt presents with deficits in strength, transfers, mobility, gait, balance, and activity tolerance.  Pt required extensive assistance with bed mobility tasks and min A from an elevated surface with transfers.  Pt was only able to take several steps at the EOB with a RW and min A.  While advancing the LLE the pt's R knee would buckle moderately each time with pt mostly self-correcting but with min A provided for stability.  The pt would not be safe to return to her prior living situation at this time due to limited support and with pt being at a very high risk for falls at this time.  Pt will benefit from PT services in a SNF setting upon discharge to safely address above deficits for decreased caregiver assistance and eventual return to PLOF.      Follow Up Recommendations SNF;Supervision for mobility/OOB    Equipment Recommendations  None recommended by PT    Recommendations for Other Services       Precautions / Restrictions Precautions Precautions: Posterior Hip;Fall Precaution Booklet Issued: Yes (comment) Precaution Comments: Posterior hip precaution education provided to pt and son Restrictions Weight Bearing Restrictions: Yes RLE Weight Bearing: Weight bearing as tolerated      Mobility  Bed Mobility Overal bed mobility: Needs Assistance Bed Mobility: Supine to Sit;Sit to Supine     Supine to sit: Mod  assist Sit to supine: Mod assist   General bed mobility comments: Mod a for RLE in/out of bed and for trunk control and positioning  Transfers Overall transfer level: Needs assistance Equipment used: Rolling walker (2 wheeled) Transfers: Sit to/from Stand Sit to Stand: Min assist;From elevated surface         General transfer comment: Mod verbal cues for sequencing for hand placement and to maintain posterior hip precautions  Ambulation/Gait Ambulation/Gait assistance: Min assist Gait Distance (Feet): 3 Feet Assistive device: Rolling walker (2 wheeled) Gait Pattern/deviations: Step-to pattern;Decreased stance time - right;Antalgic;Decreased step length - left Gait velocity: decreased   General Gait Details: Antalgic on the R hip during ambulation with the R knee buckling each time the LLE was advanced with min A provided for stability  Stairs            Wheelchair Mobility    Modified Rankin (Stroke Patients Only)       Balance Overall balance assessment: Mild deficits observed, not formally tested                                           Pertinent Vitals/Pain Pain Assessment: 0-10 Pain Score: 3  Pain Location: R ankle, no pain in the R hip Pain Descriptors / Indicators: Sore Pain Intervention(s): Premedicated before session;Monitored during session    Home Living Family/patient expects to be discharged to:: Private residence Living Arrangements: Alone Available Help  at Discharge: Personal care attendant;Family;Available PRN/intermittently;Other (Comment)(PCA 3x/wk in the AM for breakfast, housecleaning, and walks with pt ) Type of Home: House Home Access: Stairs to enter Entrance Stairs-Rails: Right;Left;Can reach both Entrance Stairs-Number of Steps: 4 Home Layout: One level Home Equipment: Walker - 2 wheels;Cane - quad;Bedside commode      Prior Function Level of Independence: Needs assistance   Gait / Transfers Assistance Needed:  Mod Ind amb limited community distances with a RW in the home and a QC in the community, 4 falls in the last 6 months including this one d/t "losing my balance"  ADL's / Homemaking Assistance Needed: PCA assists with occasional meals and housework        Hand Dominance        Extremity/Trunk Assessment        Lower Extremity Assessment Lower Extremity Assessment: Generalized weakness;RLE deficits/detail RLE: Unable to fully assess due to pain       Communication   Communication: HOH  Cognition Arousal/Alertness: Awake/alert Behavior During Therapy: WFL for tasks assessed/performed Overall Cognitive Status: Within Functional Limits for tasks assessed                                        General Comments      Exercises Other Exercises Other Exercises: HEP education per handout Other Exercises: Posterior hip precaution education with pt and son with handout provided   Assessment/Plan    PT Assessment Patient needs continued PT services  PT Problem List Decreased strength;Decreased activity tolerance;Decreased balance;Decreased knowledge of use of DME;Decreased knowledge of precautions;Decreased mobility       PT Treatment Interventions DME instruction;Gait training;Functional mobility training;Stair training;Balance training;Therapeutic exercise;Therapeutic activities;Patient/family education    PT Goals (Current goals can be found in the Care Plan section)  Acute Rehab PT Goals Patient Stated Goal: To improve balance PT Goal Formulation: With patient Time For Goal Achievement: 03/15/18 Potential to Achieve Goals: Good    Frequency BID   Barriers to discharge Inaccessible home environment;Decreased caregiver support      Co-evaluation               AM-PAC PT "6 Clicks" Mobility  Outcome Measure Help needed turning from your back to your side while in a flat bed without using bedrails?: Total Help needed moving from lying on your  back to sitting on the side of a flat bed without using bedrails?: Total Help needed moving to and from a bed to a chair (including a wheelchair)?: Total Help needed standing up from a chair using your arms (e.g., wheelchair or bedside chair)?: A Little Help needed to walk in hospital room?: Total Help needed climbing 3-5 steps with a railing? : Total 6 Click Score: 8    End of Session Equipment Utilized During Treatment: Gait belt Activity Tolerance: Patient tolerated treatment well Patient left: in bed;with bed alarm set;with call bell/phone within reach;with family/visitor present Nurse Communication: Mobility status PT Visit Diagnosis: Other abnormalities of gait and mobility (R26.89);Muscle weakness (generalized) (M62.81);Pain Pain - Right/Left: Right Pain - part of body: Hip;Ankle and joints of foot    Time: 5176-1607 PT Time Calculation (min) (ACUTE ONLY): 37 min   Charges:   PT Evaluation $PT Eval Low Complexity: 1 Low PT Treatments $Therapeutic Activity: 8-22 mins       D. Scott Dioselina Brumbaugh PT, DPT 03/02/18, 12:02 PM

## 2018-03-02 NOTE — Progress Notes (Signed)
   Subjective: 1 Day Post-Op Procedure(s) (LRB): ARTHROPLASTY BIPOLAR HIP (HEMIARTHROPLASTY) (Right) Patient reports right hip pain is mild, complaining of more severe right ankle pain along the lateral malleolus Patient is well, and has had no acute complaints or problems.  Family reports some confusion and agitation last night but this seems to be improving Denies any CP, SOB, ABD pain. We will start physical therapy today.    Objective: Vital signs in last 24 hours: Temp:  [97 F (36.1 C)-97.8 F (36.6 C)] 97.8 F (36.6 C) (11/29 2329) Pulse Rate:  [64-74] 72 (11/30 0404) Resp:  [12-21] 14 (11/30 0404) BP: (107-162)/(42-80) 118/61 (11/30 0404) SpO2:  [89 %-100 %] 97 % (11/30 0404)  Intake/Output from previous day: 11/29 0701 - 11/30 0700 In: 1224.3 [I.V.:1024.3; IV Piggyback:200] Out: 1050 [Urine:700; Blood:350] Intake/Output this shift: No intake/output data recorded.  Recent Labs    03/01/18 0619 03/02/18 0355  HGB 14.7 12.9   Recent Labs    03/01/18 0619 03/02/18 0355  WBC 15.6* 12.4*  RBC 4.67 4.06  HCT 45.0 40.1  PLT 197 173   Recent Labs    03/01/18 0619 03/02/18 0355  NA 141 138  K 3.6 3.9  CL 106 107  CO2 25 24  BUN 18 23  CREATININE 0.74 0.83  GLUCOSE 129* 178*  CALCIUM 9.1 8.3*   Recent Labs    03/01/18 0619  INR 1.02    EXAM General - Patient is Alert, Appropriate and Oriented Extremity - Neurovascular intact Sensation intact distally Intact pulses distally Dorsiflexion/Plantar flexion intact No cellulitis present Compartment soft  Ankle moderately tender along the distal fibula.  No ecchymosis.  No swelling.  2+ dorsalis pedis pulse Dressing - dressing C/D/I and no drainage Motor Function - intact, moving foot and toes well on exam.   Past Medical History:  Diagnosis Date  . Arthritis    hips  . Breast cancer ([place])   . Depression   . Hypertension   . Hypothyroidism   . Migraines    none in 8-10 yrs  . Wears hearing aid     bilateral    Assessment/Plan:   1 Day Post-Op Procedure(s) (LRB): ARTHROPLASTY BIPOLAR HIP (HEMIARTHROPLASTY) (Right) Active Problems:   Hip fracture (HCC)  Estimated body mass index is 28.29 kg/m as calculated from the following:   Height as of this encounter: 5\' 5"  (1.651 m).   Weight as of this encounter: 77.1 kg. Advance diet Up with therapy, weight bearing as tolerated Labs and vital signs are stable.  Recheck labs in the morning Right hip pain controlled, complaining of moderate lateral ankle pain.  Will order right ankle x-ray Encouraged incentive spirometer We will try to avoid higher dose narcotics.  Try to use Tylenol for mild to moderate pain.    DVT Prophylaxis - Lovenox, TED hose and SCDs Weight-Bearing as tolerated to right leg   T. Rachelle Hora, PA-C [place] 03/02/2018, 8:22 AM

## 2018-03-03 LAB — CBC
HEMATOCRIT: 33.4 % — AB (ref 36.0–46.0)
Hemoglobin: 10.9 g/dL — ABNORMAL LOW (ref 12.0–15.0)
MCH: 31.6 pg (ref 26.0–34.0)
MCHC: 32.6 g/dL (ref 30.0–36.0)
MCV: 96.8 fL (ref 80.0–100.0)
Platelets: 158 10*3/uL (ref 150–400)
RBC: 3.45 MIL/uL — ABNORMAL LOW (ref 3.87–5.11)
RDW: 12.8 % (ref 11.5–15.5)
WBC: 12.4 10*3/uL — AB (ref 4.0–10.5)
nRBC: 0 % (ref 0.0–0.2)

## 2018-03-03 NOTE — Clinical Social Work Note (Signed)
The CSW met with the patient and her family at bedside to provide bed offers. The patient chose [facility]. The CSW has updated Lovena Le of acceptance and possible discharge tomorrow. The CSW is following.  Santiago Bumpers, MSW, Latanya Presser [phone]

## 2018-03-03 NOTE — Plan of Care (Signed)

## 2018-03-03 NOTE — Progress Notes (Signed)
   Subjective: 2 Days Post-Op Procedure(s) (LRB): ARTHROPLASTY BIPOLAR HIP (HEMIARTHROPLASTY) (Right) Patient reports right hip pain is mild. Slept well last night. Pain improving. Patient is well, and has had no acute complaints or problems.  No confusion/agitation. Denies any CP, SOB, ABD pain. We will continue with physical therapy today.    Objective: Vital signs in last 24 hours: Temp:  [98.3 F (36.8 C)] 98.3 F (36.8 C) (11/30 2323) Pulse Rate:  [79] 79 (11/30 2323) Resp:  [19] 19 (11/30 2323) BP: (120-126)/(52-60) 120/52 (11/30 2323) SpO2:  [96 %] 96 % (11/30 2323)  Intake/Output from previous day: 11/30 0701 - 12/01 0700 In: 8938 [P.O.:600; I.V.:375; IV Piggyback:100] Out: -  Intake/Output this shift: No intake/output data recorded.  Recent Labs    03/01/18 0619 03/02/18 0355 03/03/18 0220  HGB 14.7 12.9 10.9*   Recent Labs    03/02/18 0355 03/03/18 0220  WBC 12.4* 12.4*  RBC 4.06 3.45*  HCT 40.1 33.4*  PLT 173 158   Recent Labs    03/01/18 0619 03/02/18 0355  NA 141 138  K 3.6 3.9  CL 106 107  CO2 25 24  BUN 18 23  CREATININE 0.74 0.83  GLUCOSE 129* 178*  CALCIUM 9.1 8.3*   Recent Labs    03/01/18 0619  INR 1.02    EXAM General - Patient is Alert, Appropriate and Oriented Extremity - Neurovascular intact Sensation intact distally Intact pulses distally Dorsiflexion/Plantar flexion intact No cellulitis present Compartment soft  Ankle moderately tender along the distal fibula.  No ecchymosis.  No swelling.  2+ dorsalis pedis pulse Dressing - dressing C/D/I and no drainage Motor Function - intact, moving foot and toes well on exam.   Past Medical History:  Diagnosis Date  . Arthritis    hips  . Breast cancer ([place])   . Depression   . Hypertension   . Hypothyroidism   . Migraines    none in 8-10 yrs  . Wears hearing aid    bilateral    Assessment/Plan:   2 Days Post-Op Procedure(s) (LRB): ARTHROPLASTY BIPOLAR HIP  (HEMIARTHROPLASTY) (Right) Active Problems:   Hip fracture (HCC)  Estimated body mass index is 28.29 kg/m as calculated from the following:   Height as of this encounter: 5\' 5"  (1.651 m).   Weight as of this encounter: 77.1 kg. Advance diet Up with therapy, weight bearing as tolerated Labs and vital signs are stable. Right hip pain well controlled Encouraged incentive spirometer Continue to avoid higher dose narcotics.  Try to use Tylenol for mild to moderate pain. Care management to assist with discharge to skilled nursing facility tomorrow  Remove staples and apply Steri-Strips on 03/15/2018. Follow-up with [facility] orthopedics in 6 weeks for x-rays of the right hip. TED hose bilateral lower extremities x2 weeks Lovenox 40 mg subcu daily x14 days.  DVT Prophylaxis - Lovenox, TED hose and SCDs Weight-Bearing as tolerated to right leg   T. Rachelle Hora, PA-C [place] 03/03/2018, 8:43 AM

## 2018-03-03 NOTE — Progress Notes (Signed)
[place] at [place] NAME: Destiny Neal    MR#:  [id number]  DATE OF BIRTH:  [birth year]  SUBJECTIVE:  CHIEF COMPLAINT:   Chief Complaint  Patient presents with  . Fall  Patient seen and evaluated today Status post right hip hemiarthroplasty day 2 Tolerated procedure well Patient had some pain in the heel this morning Received range of motion exercises by physical therapy Hip pain is better   REVIEW OF SYSTEMS:    ROS  CONSTITUTIONAL: No documented fever. No fatigue, weakness. No weight gain, no weight loss.  EYES: No blurry or double vision.  ENT: No tinnitus. No postnasal drip. No redness of the oropharynx.  RESPIRATORY: No cough, no wheeze, no hemoptysis. No dyspnea.  CARDIOVASCULAR: No chest pain. No orthopnea. No palpitations. No syncope.  GASTROINTESTINAL: No nausea, no vomiting or diarrhea. No abdominal pain. No melena or hematochezia.  GENITOURINARY: No dysuria or hematuria.  ENDOCRINE: No polyuria or nocturia. No heat or cold intolerance.  HEMATOLOGY: No anemia. No bruising. No bleeding.  INTEGUMENTARY: No rashes. No lesions.  MUSCULOSKELETAL:. No swelling. No gout.  Has right hip pain  Heel pain NEUROLOGIC: No numbness, tingling, or ataxia. No seizure-type activity.  PSYCHIATRIC: No anxiety. No insomnia. No ADD.   DRUG ALLERGIES:   Allergies  Allergen Reactions  . Lincocin [Lincomycin Hcl] Swelling    angioedema  . Codeine     Severe headache  . Hydrocodone Nausea Only and Other (See Comments)    fainted  . Metronidazole Itching    severe  . Penicillins Swelling  . Tramadol Nausea And Vomiting  . Betadine [Povidone Iodine] Rash    Itching  . Iodine Rash    itching    VITALS:  Blood pressure (!) 120/52, pulse 79, temperature 98.3 F (36.8 C), temperature source Oral, resp. rate 19, height 5\' 5"  (1.651 m), weight 77.1 kg, SpO2 96 %.  PHYSICAL EXAMINATION:   Physical Exam  GENERAL:  84 y.o.-year-old  patient lying in the bed with no acute distress.  EYES: Pupils equal, round, reactive to light and accommodation. No scleral icterus. Extraocular muscles intact.  HEENT: Head atraumatic, normocephalic. Oropharynx and nasopharynx clear.  NECK:  Supple, no jugular venous distention. No thyroid enlargement, no tenderness.  LUNGS: Normal breath sounds bilaterally, no wheezing, rales, rhonchi. No use of accessory muscles of respiration.  CARDIOVASCULAR: S1, S2 normal. No murmurs, rubs, or gallops.  ABDOMEN: Soft, nontender, nondistended. Bowel sounds present. No organomegaly or mass.  EXTREMITIES: No cyanosis, clubbing or edema b/l.   Bandage noted on the right hip No discharge or swelling NEUROLOGIC: Cranial nerves II through XII are intact. No focal Motor or sensory deficits b/l.   PSYCHIATRIC: The patient is alert and oriented x 3.  SKIN: No obvious rash, lesion, or ulcer.   LABORATORY PANEL:   CBC Recent Labs  Lab 03/03/18 0220  WBC 12.4*  HGB 10.9*  HCT 33.4*  PLT 158   ------------------------------------------------------------------------------------------------------------------ Chemistries  Recent Labs  Lab 03/01/18 0619 03/02/18 0355  NA 141 138  K 3.6 3.9  CL 106 107  CO2 25 24  GLUCOSE 129* 178*  BUN 18 23  CREATININE 0.74 0.83  CALCIUM 9.1 8.3*  AST 26  --   ALT 18  --   ALKPHOS 63  --   BILITOT 0.8  --    ------------------------------------------------------------------------------------------------------------------  Cardiac Enzymes No results for input(s): TROPONINI in the last 168 hours. ------------------------------------------------------------------------------------------------------------------  RADIOLOGY:  Dg Ankle Complete  Right  Result Date: 03/02/2018 CLINICAL DATA:  Ankle pain EXAM: RIGHT ANKLE - COMPLETE 3+ VIEW COMPARISON:  None. FINDINGS: Bones are osteopenic. Normal alignment without acute osseous finding, fracture, subluxation or  dislocation. No soft tissue abnormality. No significant joint abnormality. IMPRESSION: Osteopenia.  No acute finding by plain radiography Electronically Signed   By: Jerilynn Mages.  Shick M.D.   On: 03/02/2018 09:02   Dg Hip Unilat W Or W/o Pelvis 2-3 Views Right  Result Date: 03/01/2018 CLINICAL DATA:  Postop EXAM: DG HIP (WITH OR WITHOUT PELVIS) 2-3V RIGHT COMPARISON:  03/01/2018 FINDINGS: Prior left hip replacement. Interval right hip replacement with intact hardware and normal alignment.Old left pubic fractures. IMPRESSION: S/p right hip replacement with expected surgical changes. Electronically Signed   By: Donavan Foil M.D.   On: 03/01/2018 20:51     ASSESSMENT AND PLAN:  84 year old female patient with history of decreased hearing, arthritis, hypertension, breast cancer, hypothyroidism currently under hospitalist service for hip fracture  -Right hip fracture status post bipolar hip hemiarthroplasty right side Orthopedic surgery follow-up appreciated Physical therapy evaluation and exercises to continue Ankle x-ray did not reveal any fracture and no evidence of deformity in the heel  -Leukocytosis  Urinalysis and chest x-ray did not show any infection No evidence of any fever Antibiotics deferred MRSA PCR is negative  -DVT prophylaxis  Patient started on subcu Lovenox 40 mg daily daily  -Hip pain Continue pain management with morphine IV as needed along with oral Percocet as needed  -Hyperlipidemia Continue rosuvastatin  -Hypertension Resume oral Norvasc  -PRN stool softeners for constipation  -Social worker follow-up for SNF placement  -Family updated   All the records are reviewed and case discussed with Care Management/Social Worker. Management plans discussed with the patient, family and they are in agreement.  CODE STATUS: Full code  DVT Prophylaxis: SCDs  TOTAL TIME TAKING CARE OF THIS PATIENT: 35 minutes.   POSSIBLE D/C IN 2 to 3 DAYS, DEPENDING ON CLINICAL  CONDITION.  Saundra Shelling M.D on 03/03/2018 at 1:16 PM  Between 7am to 6pm - Pager - [phone]  After 6pm go to [email or web address] - password [email or web address] [facility]  Office  [phone]  CC: Primary care physician; Katheren Shams  Note: This dictation was prepared with Dragon dictation along with smaller phrase technology. Any transcriptional errors that result from this process are unintentional.

## 2018-03-03 NOTE — Progress Notes (Signed)
Patient transferred from chair to bed 2 person assist. Family at bedside. Lowbed in lowest position, bed alarm on, call bell in reach. Pain meds given with some relief. Continue to monitor.

## 2018-03-03 NOTE — Progress Notes (Signed)
Physical Therapy Treatment Patient Details Name: Destiny Neal MRN: [id number] DOB: March 12, 1921 Today's Date: 03/03/2018    History of Present Illness Pt is a 84 y.o. female with a known history of breast cancer, arthritis, hypertension, hypothyroidism, decreased hearing presented to the emergency room for fall.  Patient woke up at 4:30 AM at her house she was trying to go to the bathroom.  She lost balance and fell on the right hip.  Pt diagnosed with a displaced femoral neck fracture, right hip, and is s/p right hip unipolar hemiarthroplasty.    PT Comments    Patient is able to perform bed mobility with improved speed and independence from previous session(s). However this date she is unable to perform ambulation secondary to pain/difficulty with weightbearing on her RLE. She continues to complain of pain in her feet, however no obvious source of pain noted (skin was WNL, no obvious tenderness to palpation). She required significant assistance to perform stand pivot transfer as she was unable to complete enough ambulation with RW to complete transfer from bed to chair otherwise.    Follow Up Recommendations  SNF;Supervision for mobility/OOB     Equipment Recommendations  None recommended by PT    Recommendations for Other Services       Precautions / Restrictions Precautions Precautions: Posterior Hip;Fall Precaution Booklet Issued: Yes (comment) Precaution Comments: Posterior hip precaution education provided to pt and son Restrictions Weight Bearing Restrictions: Yes RLE Weight Bearing: Weight bearing as tolerated    Mobility  Bed Mobility Overal bed mobility: Needs Assistance Bed Mobility: Supine to Sit     Supine to sit: Mod assist     General bed mobility comments: Patient requires mod-max A to elevate her torso off the bedside as well as bringing her RLE over the edge of the bed.   Transfers Overall transfer level: Needs assistance Equipment used: Rolling  walker (2 wheeled) Transfers: Sit to/from Stand Sit to Stand: From elevated surface;Mod assist;Max assist         General transfer comment: Patient requires mod-max A for transfer secondary to difficulty with weightbearing on her RLE.   Ambulation/Gait Ambulation/Gait assistance: Min assist Gait Distance (Feet): 1 Feet Assistive device: Rolling walker (2 wheeled) Gait Pattern/deviations: Antalgic;Step-to pattern;Decreased weight shift to right Gait velocity: decreased Gait velocity interpretation: <1.31 ft/sec, indicative of household ambulator General Gait Details: Patient demonstrates difficulty with weightbearing on her RLE and was only able to shuffle her feet 2-3 times and ultimately required stand pivot transfer.   Stairs             Wheelchair Mobility    Modified Rankin (Stroke Patients Only)       Balance Overall balance assessment: Needs assistance;History of Falls Sitting-balance support: Bilateral upper extremity supported Sitting balance-Leahy Scale: Fair   Postural control: Left lateral lean Standing balance support: Bilateral upper extremity supported Standing balance-Leahy Scale: Poor                              Cognition Arousal/Alertness: Awake/alert Behavior During Therapy: WFL for tasks assessed/performed Overall Cognitive Status: Within Functional Limits for tasks assessed                                        Exercises Total Joint Exercises Ankle Circles/Pumps: AROM;Both;20 reps Quad Sets: Strengthening;Both;15 reps Heel Slides: AAROM;Both;15 reps Hip ABduction/ADduction:  AAROM;Both;15 reps Long Arc Quad: AROM;Both;15 reps Knee Flexion: AROM;Both;15 reps    General Comments        Pertinent Vitals/Pain Pain Assessment: Faces Faces Pain Scale: Hurts even more Pain Location: R hip Pain Descriptors / Indicators: Aching;Operative site guarding Pain Intervention(s): Limited activity within patient's  tolerance;Repositioned;Monitored during session;RN gave pain meds during session    Home Living                      Prior Function            PT Goals (current goals can now be found in the care plan section) Acute Rehab PT Goals Patient Stated Goal: To improve balance PT Goal Formulation: With patient/family Time For Goal Achievement: 03/15/18 Potential to Achieve Goals: Good Progress towards PT goals: Progressing toward goals    Frequency    BID      PT Plan Current plan remains appropriate    Co-evaluation              AM-PAC PT "6 Clicks" Mobility   Outcome Measure  Help needed turning from your back to your side while in a flat bed without using bedrails?: A Lot Help needed moving from lying on your back to sitting on the side of a flat bed without using bedrails?: A Lot Help needed moving to and from a bed to a chair (including a wheelchair)?: A Lot Help needed standing up from a chair using your arms (e.g., wheelchair or bedside chair)?: A Lot Help needed to walk in hospital room?: Total Help needed climbing 3-5 steps with a railing? : Total 6 Click Score: 10    End of Session Equipment Utilized During Treatment: Gait belt Activity Tolerance: Patient tolerated treatment well Patient left: with call bell/phone within reach;with family/visitor present;in chair;with chair alarm set Nurse Communication: Mobility status;Other (comment)(Pt requires a call bell) PT Visit Diagnosis: Other abnormalities of gait and mobility (R26.89);Muscle weakness (generalized) (M62.81);Pain Pain - Right/Left: Right Pain - part of body: Hip;Ankle and joints of foot     Time: 6294-7654 PT Time Calculation (min) (ACUTE ONLY): 35 min  Charges:  $Gait Training: 8-22 mins $Therapeutic Exercise: 8-22 mins                     Royce Macadamia PT, DPT, CSCS   03/03/2018, 3:42 PM

## 2018-03-04 ENCOUNTER — Encounter
Admission: RE | Admit: 2018-03-04 | Discharge: 2018-03-04 | Disposition: A | Discharge status: Home / Self Care | Payer: Medicare Other | Source: Ambulatory Visit | Attending: Internal Medicine | Admitting: Internal Medicine

## 2018-03-04 MED ORDER — ENOXAPARIN SODIUM 40 MG/0.4ML ~~LOC~~ SOLN: 40.0000 mg | SUBCUTANEOUS | 0 refills | Status: DC

## 2018-03-04 MED ORDER — BISACODYL 10 MG RE SUPP: 10.0000 mg | Freq: Every day | RECTAL | 0 refills | Status: DC | PRN

## 2018-03-04 MED ORDER — TRAMADOL HCL 50 MG PO TABS: 50.0000 mg | ORAL_TABLET | Freq: Four times a day (QID) | ORAL | Status: DC | PRN

## 2018-03-04 MED ORDER — TRAMADOL HCL 50 MG PO TABS
50.0000 mg | ORAL_TABLET | Freq: Four times a day (QID) | ORAL | Status: DC
  Filled 2018-03-04: qty 1

## 2018-03-04 MED ORDER — DOCUSATE SODIUM 100 MG PO CAPS: 100.0000 mg | ORAL_CAPSULE | Freq: Two times a day (BID) | ORAL | 0 refills | Status: DC

## 2018-03-04 MED ORDER — ACETAMINOPHEN 325 MG PO TABS: 325.0000 mg | ORAL_TABLET | Freq: Four times a day (QID) | ORAL | 0 refills | Status: DC | PRN

## 2018-03-04 MED ORDER — SENNOSIDES-DOCUSATE SODIUM 8.6-50 MG PO TABS: 1.0000 | ORAL_TABLET | Freq: Every evening | ORAL | 0 refills | Status: DC | PRN

## 2018-03-04 MED ORDER — OXYCODONE HCL 5 MG PO TABS: 5.0000 mg | ORAL_TABLET | ORAL | 0 refills | Status: DC | PRN

## 2018-03-04 MED ORDER — OXYCODONE HCL 5 MG PO TABS
5.0000 mg | ORAL_TABLET | ORAL | Status: DC | PRN
  Administered 2018-03-04: 5 mg via ORAL
  Filled 2018-03-04: qty 1

## 2018-03-04 NOTE — Plan of Care (Signed)

## 2018-03-04 NOTE — Clinical Social Work Placement (Signed)
   CLINICAL SOCIAL WORK PLACEMENT  NOTE  Date:  03/04/2018  Patient Details  Name: ANATALIA KRONK MRN: [id number] Date of Birth: 10-May-1920  Clinical Social Work is seeking post-discharge placement for this patient at the [place] level of care (*CSW will initial, date and re-position this form in  chart as items are completed):  Yes   Patient/family provided with [place] Work Department's list of facilities offering this level of care within the geographic area requested by the patient (or if unable, by the patient's family).  Yes   Patient/family informed of their freedom to choose among providers that offer the needed level of care, that participate in Medicare, Medicaid or managed care program needed by the patient, have an available bed and are willing to accept the patient.  Yes   Patient/family informed of [facility]'s ownership interest in [facility] and [facility], as well as of the fact that they are under no obligation to receive care at these facilities.  PASRR submitted to EDS on       PASRR number received on       Existing PASRR number confirmed on 03/01/18     FL2 transmitted to all facilities in geographic area requested by pt/family on 03/01/18     FL2 transmitted to all facilities within larger geographic area on       Patient informed that his/her managed care company has contracts with or will negotiate with certain facilities, including the following:        Yes   Patient/family informed of bed offers received.  Patient chooses bed at [facility] )     Physician recommends and patient chooses bed at      Patient to be transferred to [facility] ) on 03/04/18.  Patient to be transferred to facility by [facility] )     Patient family notified on 03/04/18 of transfer.  Name of family member notified:  (Patient's daughter Jeani Hawking is aware of D/C today. )     PHYSICIAN       Additional  Comment:    _______________________________________________ Erica Richwine, Veronia Beets, LCSW 03/04/2018, 3:06 PM

## 2018-03-04 NOTE — Progress Notes (Signed)
Patient is medically stable for D/C to [facility] today. Per [facility] admissions coordinator at [facility] SN[facility] authorization has been received and patient can come today to room 217. RN will call report at [phone] and arrange EMS for transport. Clinical Education officer, museum (CSW) sent D/C orders to [employer] via [employer]. Patient is aware of above. Patient's daughter Jeani Hawking is at bedside and aware of above. Please reconsult if future social work needs arise. CSW signing off.   [employer], LCSW [phone]

## 2018-03-04 NOTE — Progress Notes (Signed)
Called report to [facility]. Answered all questions. Ems called for transport

## 2018-03-04 NOTE — Discharge Summary (Signed)
[facility] [place] NAME: Destiny Neal    MR#:  [id number]  DATE OF BIRTH:  Nov 09, 1920  DATE OF ADMISSION:  03/01/2018 ADMITTING PHYSICIAN: Saundra Shelling, MD  DATE OF DISCHARGE: 03/04/2018  PRIMARY CARE PHYSICIAN: [facility] Jef[facility] Bryant   ADMISSION DIAGNOSIS:  Closed displaced fracture of right femoral neck (HCC) [S72.001A]  Mechanical fall Leukocytosis Hip pain Hyperlipidemia Hypertension  DISCHARGE DIAGNOSIS:  Right femoral neck fracture Mechanical fall Leukocytosis Hyperlipidemia Hypertension  SECONDARY DIAGNOSIS:   Past Medical History:  Diagnosis Date  . Arthritis    hips  . Breast cancer ([place])   . Depression   . Hypertension   . Hypothyroidism   . Migraines    none in 8-10 yrs  . Wears hearing aid    bilateral     ADMITTING HISTORY Destiny Neal  is a 84 y.o. female with a known history of breast cancer, arthritis, hypertension, hypothyroidism, decreased hearing presented to the emergency room for fall.  Patient woke up from 4:30 AM at her house she was trying to go to the bathroom.  She lost balance and fell on the right hip.  Patient is aching pain in the right hip 7 out of 10 on a scale of 1-10.  No complaints of any chest pain, shortness of breath.  Patient ambulates with the help of cane and walker at home.  He is pretty active and independent in day-to-day living.  HOSPITAL COURSE:  Patient was admitted to the medical floor.  Orthopedic surgery evaluation was done.  Patient underwent open reduction internal fixation of the femoral neck fracture.  She tolerated the procedure well.  Patient received aggressive physical therapy.  Patient unable to take care of herself at home.  Family and patient wanted a skilled nursing facility placement.  She will worker consultation was done.  They have accepted [facility] facility.  Patient was worked up with hip x-ray, chest x-ray and ankle x-ray during the stay in the  hospital.  Patient received a subcu Lovenox 40 mg daily for DVT prophylaxis.  She will be discharged to [facility] facility for rehab.  Family has been updated.  CONSULTS OBTAINED:  Treatment Team:  Yolonda Kida, MD Poggi, Marshall Cork, MD Festus Aloe, MD  DRUG ALLERGIES:   Allergies  Allergen Reactions  . Lincocin [Lincomycin Hcl] Swelling    angioedema  . Codeine     Severe headache  . Hydrocodone Nausea Only and Other (See Comments)    fainted  . Metronidazole Itching    severe  . Penicillins Swelling  . Tramadol Nausea And Vomiting  . Betadine [Povidone Iodine] Rash    Itching  . Iodine Rash    itching    DISCHARGE MEDICATIONS:   Allergies as of 03/04/2018      Reactions   Lincocin [lincomycin Hcl] Swelling   angioedema   Codeine    Severe headache   Hydrocodone Nausea Only, Other (See Comments)   fainted   Metronidazole Itching   severe   Penicillins Swelling   Tramadol Nausea And Vomiting   Betadine [povidone Iodine] Rash   Itching   Iodine Rash   itching      Medication List    STOP taking these medications   valACYclovir 1000 MG tablet Commonly known as:  VALTREX     TAKE these medications   acetaminophen 325 MG tablet Commonly known as:  TYLENOL Take 1-2 tablets (325-650 mg total) by mouth every 6 (six) hours as needed  for mild pain or moderate pain (pain score 1-3 or temp > 100.5).   amLODipine 5 MG tablet Commonly known as:  NORVASC Take 1 tablet (5 mg total) by mouth daily.   aspirin 81 MG tablet Take 81 mg by mouth daily.   bisacodyl 10 MG suppository Commonly known as:  DULCOLAX Place 1 suppository (10 mg total) rectally daily as needed for moderate constipation.   CALCIUM + D PO Take 1 tablet by mouth 2 (two) times daily.   clopidogrel 75 MG tablet Commonly known as:  PLAVIX Take 1 tablet (75 mg total) by mouth daily.   cyanocobalamin 1000 MCG/ML injection Commonly known as:  (VITAMIN B-12) Inject 1 mL into the muscle  every 30 (thirty) days.   docusate sodium 100 MG capsule Commonly known as:  COLACE Take 1 capsule (100 mg total) by mouth 2 (two) times daily. What changed:  when to take this   dorzolamide-timolol 22.3-6.8 MG/ML ophthalmic solution Commonly known as:  COSOPT Place 1 drop into both eyes 2 (two) times daily.   enoxaparin 40 MG/0.4ML injection Commonly known as:  LOVENOX Inject 0.4 mLs (40 mg total) into the skin daily for 14 days. Start taking on:  03/05/2018   gabapentin 100 MG capsule Commonly known as:  NEURONTIN Take 100 mg by mouth 2 (two) times daily.   GRAPESEED EXTRACT PO Take 100 mg by mouth daily.   hydrALAZINE 25 MG tablet Commonly known as:  APRESOLINE Take 1 tablet (25 mg total) by mouth 3 (three) times daily.   latanoprost 0.005 % ophthalmic solution Commonly known as:  XALATAN Place 1 drop into both eyes at bedtime.   levothyroxine 25 MCG tablet Commonly known as:  SYNTHROID, LEVOTHROID Take 25 mcg by mouth daily before breakfast.   multivitamin capsule Take 1 capsule by mouth daily.   oxyCODONE 5 MG immediate release tablet Commonly known as:  Oxy IR/ROXICODONE Take 1 tablet (5 mg total) by mouth every 4 (four) hours as needed for up to 7 days for moderate pain or severe pain (pain score 4-6).   PARoxetine 10 MG tablet Commonly known as:  PAXIL Take 5 mg by mouth daily.   rosuvastatin 5 MG tablet Commonly known as:  CRESTOR Take 1 tablet (5 mg total) by mouth daily at 6 PM.   senna-docusate 8.6-50 MG tablet Commonly known as:  Senokot-S Take 1 tablet by mouth at bedtime as needed for mild constipation.       Today  Patient seen and evaluated today Tolerating hip pain well Receiving physical therapy Will be discharged to SNF  VITAL SIGNS:  Blood pressure 138/60, pulse 83, temperature 98.1 F (36.7 C), temperature source Oral, resp. rate 19, height 5\' 5"  (1.651 m), weight 77.1 kg, SpO2 93 %.  I/O:    Intake/Output Summary (Last 24  hours) at 03/04/2018 1446 Last data filed at 03/04/2018 0937 Gross per 24 hour  Intake 1200 ml  Output -  Net 1200 ml    PHYSICAL EXAMINATION:  Physical Exam  GENERAL:  84 y.o.-year-old patient lying in the bed with no acute distress.  LUNGS: Normal breath sounds bilaterally, no wheezing, rales,rhonchi or crepitation. No use of accessory muscles of respiration.  CARDIOVASCULAR: S1, S2 normal. No murmurs, rubs, or gallops.  ABDOMEN: Soft, non-tender, non-distended. Bowel sounds present. No organomegaly or mass.  NEUROLOGIC: Moves all 4 extremities. PSYCHIATRIC: The patient is alert and oriented x 3.  SKIN: No obvious rash, lesion, or ulcer.   DATA REVIEW:  CBC Recent Labs  Lab 03/03/18 0220  WBC 12.4*  HGB 10.9*  HCT 33.4*  PLT 158    Chemistries  Recent Labs  Lab 03/01/18 0619 03/02/18 0355  NA 141 138  K 3.6 3.9  CL 106 107  CO2 25 24  GLUCOSE 129* 178*  BUN 18 23  CREATININE 0.74 0.83  CALCIUM 9.1 8.3*  AST 26  --   ALT 18  --   ALKPHOS 63  --   BILITOT 0.8  --     Cardiac Enzymes No results for input(s): TROPONINI in the last 168 hours.  Microbiology Results  Results for orders placed or performed during the hospital encounter of 03/01/18  Surgical PCR screen     Status: None   Collection Time: 03/01/18 11:15 AM  Result Value Ref Range Status   MRSA, PCR NEGATIVE NEGATIVE Final   Staphylococcus aureus NEGATIVE NEGATIVE Final    Comment: (NOTE) The Xpert SA Assay (FDA approved for NASAL specimens in patients 69 years of age and older), is one component of a comprehensive surveillance program. It is not intended to diagnose infection nor to guide or monitor treatment. Performed at [facility], [address], [place], [place] [address]     RADIOLOGY:  No results found.  Follow up with PCP in 1 week.  Management plans discussed with the patient, family and they are in agreement.  CODE STATUS: Full code    Code Status Orders   (From admission, onward)         Start     Ordered   03/01/18 0930  Full code  Continuous     03/01/18 0930        Code Status History    Date Active Date Inactive Code Status Order ID Comments User Context   07/02/2016 2340 07/06/2016 1732 Full Code [id number]  Harvie Bridge, DO ED    Advance Directive Documentation     Most Recent Value  Type of Advance Directive  Healthcare Power of Attorney, Living will  Pre-existing out of facility DNR order (yellow form or pink MOST form)  -  "MOST" Form in Place?  -      TOTAL TIME TAKING CARE OF THIS PATIENT ON DAY OF DISCHARGE: more than 35 minutes.   Saundra Shelling M.D on 03/04/2018 at 2:46 PM  Between 7am to 6pm - Pager - [phone]  After 6pm go to [email or web address] - password [email or web address] [facility]  Office  [phone]  CC: Primary care physician; Katheren Shams  Note: This dictation was prepared with Dragon dictation along with smaller phrase technology. Any transcriptional errors that result from this process are unintentional.

## 2018-03-04 NOTE — Progress Notes (Addendum)
Per [facility] admissions coordinator at [facility] she will start [facility] SNF authorization today. Patient's daughter Jeani Hawking is aware of above.   [employer], LCSW [phone]

## 2018-03-04 NOTE — Progress Notes (Signed)
[place] [facility] [place] NAME: Destiny Neal    MR#:  [id number]  DATE OF BIRTH:  April 11, 1920  SUBJECTIVE:  CHIEF COMPLAINT:   Chief Complaint  Patient presents with  . Fall  Patient seen and evaluated today Status post right hip hemiarthroplasty day 3 Tolerated procedure well Heel pain better Hip pain is better Receiving physical therapy Family does not want tramadol for pain medication   REVIEW OF SYSTEMS:    ROS  CONSTITUTIONAL: No documented fever. No fatigue, weakness. No weight gain, no weight loss.  EYES: No blurry or double vision.  ENT: No tinnitus. No postnasal drip. No redness of the oropharynx.  RESPIRATORY: No cough, no wheeze, no hemoptysis. No dyspnea.  CARDIOVASCULAR: No chest pain. No orthopnea. No palpitations. No syncope.  GASTROINTESTINAL: No nausea, no vomiting or diarrhea. No abdominal pain. No melena or hematochezia.  GENITOURINARY: No dysuria or hematuria.  ENDOCRINE: No polyuria or nocturia. No heat or cold intolerance.  HEMATOLOGY: No anemia. No bruising. No bleeding.  INTEGUMENTARY: No rashes. No lesions.  MUSCULOSKELETAL:. No swelling. No gout.  Has decreased right hip pain  Heel pain improved NEUROLOGIC: No numbness, tingling, or ataxia. No seizure-type activity.  PSYCHIATRIC: No anxiety. No insomnia. No ADD.   DRUG ALLERGIES:   Allergies  Allergen Reactions  . Lincocin [Lincomycin Hcl] Swelling    angioedema  . Codeine     Severe headache  . Hydrocodone Nausea Only and Other (See Comments)    fainted  . Metronidazole Itching    severe  . Penicillins Swelling  . Tramadol Nausea And Vomiting  . Betadine [Povidone Iodine] Rash    Itching  . Iodine Rash    itching    VITALS:  Blood pressure 138/60, pulse 83, temperature 98.1 F (36.7 C), temperature source Oral, resp. rate 19, height 5\' 5"  (1.651 m), weight 77.1 kg, SpO2 93 %.  PHYSICAL EXAMINATION:   Physical Exam  GENERAL:  84  y.o.-year-old patient lying in the bed with no acute distress.  EYES: Pupils equal, round, reactive to light and accommodation. No scleral icterus. Extraocular muscles intact.  HEENT: Head atraumatic, normocephalic. Oropharynx and nasopharynx clear.  NECK:  Supple, no jugular venous distention. No thyroid enlargement, no tenderness.  LUNGS: Normal breath sounds bilaterally, no wheezing, rales, rhonchi. No use of accessory muscles of respiration.  CARDIOVASCULAR: S1, S2 normal. No murmurs, rubs, or gallops.  ABDOMEN: Soft, nontender, nondistended. Bowel sounds present. No organomegaly or mass.  EXTREMITIES: No cyanosis, clubbing or edema b/l.   Bandage noted on the right hip No discharge or swelling NEUROLOGIC: Cranial nerves II through XII are intact. No focal Motor or sensory deficits b/l.   PSYCHIATRIC: The patient is alert and oriented x 3.  SKIN: No obvious rash, lesion, or ulcer.   LABORATORY PANEL:   CBC Recent Labs  Lab 03/03/18 0220  WBC 12.4*  HGB 10.9*  HCT 33.4*  PLT 158   ------------------------------------------------------------------------------------------------------------------ Chemistries  Recent Labs  Lab 03/01/18 0619 03/02/18 0355  NA 141 138  K 3.6 3.9  CL 106 107  CO2 25 24  GLUCOSE 129* 178*  BUN 18 23  CREATININE 0.74 0.83  CALCIUM 9.1 8.3*  AST 26  --   ALT 18  --   ALKPHOS 63  --   BILITOT 0.8  --    ------------------------------------------------------------------------------------------------------------------  Cardiac Enzymes No results for input(s): TROPONINI in the last 168 hours. ------------------------------------------------------------------------------------------------------------------  RADIOLOGY:  No results found.  ASSESSMENT AND PLAN:  84 year old female patient with history of decreased hearing, arthritis, hypertension, breast cancer, hypothyroidism currently under hospitalist service for hip fracture  -Right  hip fracture status post bipolar hip hemiarthroplasty right side Orthopedic surgery follow-up appreciated Physical therapy evaluation and exercises to continue Ankle x-ray did not reveal any fracture and no evidence of deformity in the heel Awaiting SNF placement Pending insurance authorization  -Leukocytosis stable Urinalysis and chest x-ray did not show any infection No evidence of any fever Antibiotics deferred MRSA PCR is negative  -DVT prophylaxis  Patient started on subcu Lovenox 40 mg daily daily and will continue  -Hip pain Oral oxycodone and Tylenol as needed for pain Family does not want any tramadol for pain  -Hyperlipidemia Continue rosuvastatin  -Hypertension Resume oral Norvasc  -PRN stool softeners for constipation  -Social worker follow-up for SNF placement  -Family updated   All the records are reviewed and case discussed with Care Management/Social Worker. Management plans discussed with the patient, family and they are in agreement.  CODE STATUS: Full code  DVT Prophylaxis: SCDs  TOTAL TIME TAKING CARE OF THIS PATIENT: 33 minutes.   POSSIBLE D/C IN 2 to 3 DAYS, DEPENDING ON CLINICAL CONDITION.  Saundra Shelling M.D on 03/04/2018 at 12:42 PM  Between 7am to 6pm - Pager - [phone]  After 6pm go to [email or web address] - password [email or web address] [facility]  Office  [phone]  CC: Primary care physician; Katheren Shams  Note: This dictation was prepared with Dragon dictation along with smaller phrase technology. Any transcriptional errors that result from this process are unintentional.

## 2018-03-04 NOTE — Progress Notes (Signed)
Physical Therapy Treatment Patient Details Name: Destiny Neal MRN: [id number] DOB: March 08, 1921 Today's Date: 03/04/2018    History of Present Illness Pt is a 84 y.o. female with a known history of breast cancer, arthritis, hypertension, hypothyroidism, decreased hearing presented to the emergency room for fall.  Patient woke up at 4:30 AM at her house she was trying to go to the bathroom.  She lost balance and fell on the right hip.  Pt diagnosed with a displaced femoral neck fracture, right hip, and is s/p right hip unipolar hemiarthroplasty.    PT Comments    Pt presents with deficits in strength, transfers, mobility, gait, balance, and activity tolerance.  Pt continues to require extensive assistance during sup to/from sit for both her RLE in/out of bed as well as for trunk control.  Pt presents with a left lateral lean away from her R hip in sitting but corrects with cues.  Pt required min A and cues for sequencing during transfers and presented with some posterior instability upon initial stand with pt able to self-correct.  Pt was able to take several slow, cautious steps at the EOB with a RW using step-to pattern with extensive cuing for sequencing.  Pt will benefit from PT services in a SNF setting upon discharge to safely address above deficits for decreased caregiver assistance and eventual return to PLOF.     Follow Up Recommendations  SNF;Supervision for mobility/OOB     Equipment Recommendations  None recommended by PT    Recommendations for Other Services       Precautions / Restrictions Precautions Precautions: Posterior Hip;Fall Precaution Booklet Issued: Yes (comment) Precaution Comments: Posterior hip precaution education provided to pt and family Restrictions Weight Bearing Restrictions: Yes RLE Weight Bearing: Weight bearing as tolerated    Mobility  Bed Mobility Overal bed mobility: Needs Assistance Bed Mobility: Supine to Sit;Sit to Supine     Supine  to sit: Mod assist Sit to supine: Mod assist   General bed mobility comments: Patient requires mod A to elevate her torso off the bedside as well as bringing her RLE in/out of bed.   Transfers Overall transfer level: Needs assistance Equipment used: Rolling walker (2 wheeled) Transfers: Sit to/from Stand Sit to Stand: From elevated surface;Min assist         General transfer comment: Mod verbal and tactile cues for sequencing for post hip precaution compliance  Ambulation/Gait Ambulation/Gait assistance: Min assist Gait Distance (Feet): 5 Feet Assistive device: Rolling walker (2 wheeled) Gait Pattern/deviations: Step-to pattern;Trunk flexed;Antalgic Gait velocity: decreased   General Gait Details: Mod to max verbal and tactile cues for sequencing during amb with a RW for general safety   Stairs             Wheelchair Mobility    Modified Rankin (Stroke Patients Only)       Balance Overall balance assessment: Needs assistance;History of Falls Sitting-balance support: Bilateral upper extremity supported Sitting balance-Leahy Scale: Fair   Postural control: Left lateral lean Standing balance support: Bilateral upper extremity supported Standing balance-Leahy Scale: Fair                              Cognition Arousal/Alertness: Awake/alert Behavior During Therapy: WFL for tasks assessed/performed Overall Cognitive Status: Within Functional Limits for tasks assessed  Exercises Total Joint Exercises Ankle Circles/Pumps: Strengthening;Both;5 reps;10 reps Towel Squeeze: Strengthening;Both;5 reps Heel Slides: AAROM;Both;10 reps Hip ABduction/ADduction: AAROM;Both;10 reps Straight Leg Raises: AAROM;Both;10 reps Long Arc Quad: AROM;Both;5 reps;10 reps Knee Flexion: AROM;Both;5 reps;10 reps Other Exercises Other Exercises: Posterior hip precaution education with pt and family, handout  provided    General Comments        Pertinent Vitals/Pain Pain Assessment: No/denies pain    Home Living                      Prior Function            PT Goals (current goals can now be found in the care plan section) Progress towards PT goals: Progressing toward goals    Frequency    BID      PT Plan Current plan remains appropriate    Co-evaluation              AM-PAC PT "6 Clicks" Mobility   Outcome Measure                   End of Session Equipment Utilized During Treatment: Gait belt Activity Tolerance: Patient tolerated treatment well Patient left: with family/visitor present;in bed;with call bell/phone within reach;with nursing/sitter in room;Other (comment)(Bed alarm and SCDs off secondary to nursing assisting pt with hygiene at end of session) Nurse Communication: Mobility status;Other (comment)(Bed controls not functioning) PT Visit Diagnosis: Other abnormalities of gait and mobility (R26.89);Muscle weakness (generalized) (M62.81);Pain Pain - Right/Left: Right Pain - part of body: Hip     Time: 5277-8242 PT Time Calculation (min) (ACUTE ONLY): 30 min  Charges:  $Therapeutic Exercise: 8-22 mins $Therapeutic Activity: 8-22 mins                     D. Scott Arlo Buffone PT, DPT 03/04/18, 11:58 AM

## 2018-03-04 NOTE — Progress Notes (Signed)
   Subjective: 3 Days Post-Op Procedure(s) (LRB): ARTHROPLASTY BIPOLAR HIP (HEMIARTHROPLASTY) (Right) Patient reports right hip pain is mild. Mild low back pain. Patient is well, and has had no acute complaints or problems.  No confusion/agitation. Denies any CP, SOB, ABD pain. We will continue with physical therapy today.    Objective: Vital signs in last 24 hours: Temp:  [98.1 F (36.7 C)] 98.1 F (36.7 C) (12/01 2315) Pulse Rate:  [77-83] 83 (12/01 2315) Resp:  [18-19] 19 (12/01 2315) BP: (124-138)/(47-60) 138/60 (12/01 2315) SpO2:  [93 %-96 %] 93 % (12/01 2315) FiO2 (%):  [22 %] 22 % (12/01 1614)  Intake/Output from previous day: 12/01 0701 - 12/02 0700 In: 960 [P.O.:960] Out: -  Intake/Output this shift: No intake/output data recorded.  Recent Labs    03/02/18 0355 03/03/18 0220  HGB 12.9 10.9*   Recent Labs    03/02/18 0355 03/03/18 0220  WBC 12.4* 12.4*  RBC 4.06 3.45*  HCT 40.1 33.4*  PLT 173 158   Recent Labs    03/02/18 0355  NA 138  K 3.9  CL 107  CO2 24  BUN 23  CREATININE 0.83  GLUCOSE 178*  CALCIUM 8.3*   No results for input(s): LABPT, INR in the last 72 hours.  EXAM General - Patient is Alert, Appropriate and Oriented Extremity - Neurovascular intact Sensation intact distally Intact pulses distally Dorsiflexion/Plantar flexion intact No cellulitis present Compartment soft  Ankle moderately tender along the distal fibula.  No ecchymosis.  No swelling.  2+ dorsalis pedis pulse Dressing - dressing C/D/I and no drainage Motor Function - intact, moving foot and toes well on exam.   Past Medical History:  Diagnosis Date  . Arthritis    hips  . Breast cancer ([place])   . Depression   . Hypertension   . Hypothyroidism   . Migraines    none in 8-10 yrs  . Wears hearing aid    bilateral    Assessment/Plan:   3 Days Post-Op Procedure(s) (LRB): ARTHROPLASTY BIPOLAR HIP (HEMIARTHROPLASTY) (Right) Active Problems:   Hip fracture  (HCC)  Estimated body mass index is 28.29 kg/m as calculated from the following:   Height as of this encounter: 5\' 5"  (1.651 m).   Weight as of this encounter: 77.1 kg. Advance diet Up with therapy, weight bearing as tolerated Labs and vital signs are stable. Right hip pain well controlled Encouraged incentive spirometer Continue to avoid higher dose narcotics.  Try to use Tylenol for mild to moderate pain. Care management to assist with discharge to skilled nursing facility today, [facility]  Remove staples and apply Steri-Strips on 03/15/2018. Follow-up with [facility] orthopedics in 6 weeks for x-rays of the right hip. TED hose bilateral lower extremities x2 weeks Lovenox 40 mg subcu daily x14 days.  DVT Prophylaxis - Lovenox, TED hose and SCDs Weight-Bearing as tolerated to right leg   T. Rachelle Hora, PA-C [place] 03/04/2018, 7:50 AM

## 2018-03-05 ENCOUNTER — Non-Acute Institutional Stay (SKILLED_NURSING_FACILITY): Payer: Medicare Other | Admitting: Adult Health

## 2018-03-05 ENCOUNTER — Other Ambulatory Visit: Payer: Self-pay | Admitting: Adult Health

## 2018-03-05 DIAGNOSIS — E039 Hypothyroidism, unspecified: Secondary | ICD-10-CM

## 2018-03-05 DIAGNOSIS — D510 Vitamin B12 deficiency anemia due to intrinsic factor deficiency: Secondary | ICD-10-CM | POA: Diagnosis not present

## 2018-03-05 DIAGNOSIS — F320 Major depressive disorder, single episode, mild: Secondary | ICD-10-CM

## 2018-03-05 DIAGNOSIS — S72001S Fracture of unspecified part of neck of right femur, sequela: Secondary | ICD-10-CM

## 2018-03-05 DIAGNOSIS — G609 Hereditary and idiopathic neuropathy, unspecified: Secondary | ICD-10-CM

## 2018-03-05 DIAGNOSIS — I63532 Cerebral infarction due to unspecified occlusion or stenosis of left posterior cerebral artery: Secondary | ICD-10-CM

## 2018-03-05 DIAGNOSIS — E785 Hyperlipidemia, unspecified: Secondary | ICD-10-CM

## 2018-03-05 DIAGNOSIS — I10 Essential (primary) hypertension: Secondary | ICD-10-CM | POA: Diagnosis not present

## 2018-03-05 DIAGNOSIS — H409 Unspecified glaucoma: Secondary | ICD-10-CM

## 2018-03-05 LAB — SURGICAL PATHOLOGY

## 2018-03-05 MED ORDER — OXYCODONE HCL 5 MG PO TABS: ORAL_TABLET | ORAL | 0 refills | Status: DC

## 2018-03-05 NOTE — Progress Notes (Signed)
Location:   [facility] Room Number: 242 Place of Service:  SNF (31)   CODE STATUS: full code   Allergies  Allergen Reactions  . Lincocin [Lincomycin Hcl] Swelling    angioedema  . Codeine     Severe headache  . Hydrocodone Nausea Only and Other (See Comments)    fainted  . Metronidazole Itching    severe  . Penicillins Swelling  . Tramadol Nausea And Vomiting  . Betadine [Povidone Iodine] Rash    Itching  . Iodine Rash    itching    Chief Complaint  Patient presents with  . Hospitalization Follow-up    HPI:  She is a 84 year old woman who had a fall at home and suffered a right femoral neck fracture. She had a hemiarthroplasty 03-01-18. She is here for short term rehab with her goal to return back home. She denies any uncontrolled right right pain. She denies any changes in appetite; no fevers; no anxiety present. She will continue to be followed for her chronic illnesses including: hypertension; cva; pernicious anemia.   Past Medical History:  Diagnosis Date  . Arthritis    hips  . Breast cancer ([place])   . Depression   . Hypertension   . Hypothyroidism   . Migraines    none in 8-10 yrs  . Wears hearing aid    bilateral    Past Surgical History:  Procedure Laterality Date  . BREAST LUMPECTOMY    . CATARACT EXTRACTION W/PHACO Left 06/21/2015   Procedure: CATARACT EXTRACTION PHACO AND INTRAOCULAR LENS PLACEMENT (IOC);  Surgeon: Ronnell Freshwater, MD;  Location: [place] [facility];  Service: Ophthalmology;  Laterality: Left;  . CATARACT EXTRACTION W/PHACO Right 07/12/2015   Procedure: CATARACT EXTRACTION PHACO AND INTRAOCULAR LENS PLACEMENT ([place]) right;  Surgeon: Ronnell Freshwater, MD;  Location: [facility];  Service: Ophthalmology;  Laterality: Right;  . HIP ARTHROPLASTY Right 03/01/2018   Procedure: ARTHROPLASTY BIPOLAR HIP (HEMIARTHROPLASTY);  Surgeon: Corky Mull, MD;  Location: [facility];  Service: Orthopedics;   Laterality: Right;  . JOINT REPLACEMENT Left    total hip    Social History   Socioeconomic History  . Marital status: Widowed    Spouse name: Not on file  . Number of children: Not on file  . Years of education: Not on file  . Highest education level: Not on file  Occupational History  . Occupation: retired  Scientific laboratory technician  . Financial resource strain: Not on file  . Food insecurity:    Worry: Not on file    Inability: Not on file  . Transportation needs:    Medical: Not on file    Non-medical: Not on file  Tobacco Use  . Smoking status: Never Smoker  . Smokeless tobacco: Never Used  Substance and Sexual Activity  . Alcohol use: No  . Drug use: Never  . Sexual activity: Not Currently  Lifestyle  . Physical activity:    Days per week: Not on file    Minutes per session: Not on file  . Stress: Not on file  Relationships  . Social connections:    Talks on phone: Not on file    Gets together: Not on file    Attends religious service: Not on file    Active member of club or organization: Not on file    Attends meetings of clubs or organizations: Not on file    Relationship status: Not on file  . Intimate partner  violence:    Fear of current or ex partner: Not on file    Emotionally abused: Not on file    Physically abused: Not on file    Forced sexual activity: Not on file  Other Topics Concern  . Not on file  Social History Narrative  . Not on file   No family history on file.    VITAL SIGNS BP (!) 117/55   Pulse 84   Temp 98 F (36.7 C)   Resp 18   Ht 5\' 5"  (1.651 m)   Wt 172 lb (78 kg)   SpO2 97%   BMI 28.62 kg/m   Outpatient Encounter Medications as of 03/05/2018  Medication Sig  . dorzolamide-timolol (COSOPT) 22.3-6.8 MG/ML ophthalmic solution Place 1 drop into the left eye 2 (two) times daily.   Marland Kitchen acetaminophen (TYLENOL) 325 MG tablet Take 1-2 tablets (325-650 mg total) by mouth every 6 (six) hours as needed for mild pain or moderate pain (pain  score 1-3 or temp > 100.5).  Marland Kitchen amLODipine (NORVASC) 5 MG tablet Take 1 tablet (5 mg total) by mouth daily.  Marland Kitchen aspirin 81 MG tablet Take 81 mg by mouth daily.  . bisacodyl (DULCOLAX) 10 MG suppository Place 1 suppository (10 mg total) rectally daily as needed for moderate constipation.  . Calcium Citrate-Vitamin D (CALCIUM + D PO) Take 1 tablet by mouth 2 (two) times daily.   . clopidogrel (PLAVIX) 75 MG tablet Take 1 tablet (75 mg total) by mouth daily.  . cyanocobalamin (,VITAMIN B-12,) 1000 MCG/ML injection Inject 1 mL into the muscle every 30 (thirty) days.  Marland Kitchen docusate sodium (COLACE) 100 MG capsule Take 1 capsule (100 mg total) by mouth 2 (two) times daily.  Marland Kitchen enoxaparin (LOVENOX) 40 MG/0.4ML injection Inject 0.4 mLs (40 mg total) into the skin daily for 14 days.  Marland Kitchen gabapentin (NEURONTIN) 100 MG capsule Take 100 mg by mouth 2 (two) times daily.  . hydrALAZINE (APRESOLINE) 25 MG tablet Take 1 tablet (25 mg total) by mouth 3 (three) times daily. (Patient taking differently: Take 25 mg by mouth 2 (two) times daily. )  . latanoprost (XALATAN) 0.005 % ophthalmic solution Place 1 drop into both eyes at bedtime.  Marland Kitchen levothyroxine (SYNTHROID, LEVOTHROID) 25 MCG tablet Take 25 mcg by mouth daily before breakfast.  . Multiple Vitamin (MULTIVITAMIN) capsule Take 1 capsule by mouth daily.  . Nutritional Supplements (GRAPESEED EXTRACT PO) Take 100 mg by mouth daily.   Marland Kitchen oxyCODONE (OXY IR/ROXICODONE) 5 MG immediate release tablet Take 5 mg every 4 hours as needed for pain rating 5-10/10  . PARoxetine (PAXIL) 10 MG tablet Take 5 mg by mouth daily.  . rosuvastatin (CRESTOR) 5 MG tablet Take 1 tablet (5 mg total) by mouth daily at 6 PM.  . senna-docusate (SENOKOT-S) 8.6-50 MG tablet Take 1 tablet by mouth at bedtime as needed for mild constipation.   No facility-administered encounter medications on file as of 03/05/2018.      SIGNIFICANT DIAGNOSTIC EXAMS  TODAY:   03-01-18: right hip and pelvic x-ray:    Subcapital femoral neck fracture on the right with impaction at the fracture site and varus angulation at the fracture site. No other acute fracture. Old healed fractures of each medial ischium with remodeling.  03-01-18: chest x-ray: No edema or consolidation. Stable cardiac silhouette. Hiatal hernia present. Aortic atherosclerosis present. Bones osteoporotic. Aortic Atherosclerosis   03-02-18: right ankle x-ray: Osteopenia.  No acute finding by plain radiography  LABS REVIEWED TODAY;  08-20-17: chol 153; ldl 82; trig 68; hdl 57.1  tsh 4.294  03-01-18: wbc 15.6; hgb 14.7; hct 45.0; mcv 96.4; plt 197; glucose 129; bun 18; creat 0.74; k+ 3.6; na++ 141; ca 9.1; liver normal albumin 4.0 03-02-18: wbc 12.4 hgb 12.9; hct 40.1; mcv 98.8; plt 173 glucose 178; bun 23; creat 0.83; k+ 3.9; na++ 138; ca 8.3 03-03-18: wbc 12.4; hgb 10.9; hct 33.4; mcv 96.8; plt 158   Review of Systems  Constitutional: Negative for malaise/fatigue.  Respiratory: Negative for cough and shortness of breath.   Cardiovascular: Negative for chest pain, palpitations and leg swelling.  Gastrointestinal: Negative for abdominal pain, constipation and heartburn.  Musculoskeletal: Positive for joint pain. Negative for back pain and myalgias.       Right hip pain is being managed   Skin: Negative.   Neurological: Negative for dizziness.  Psychiatric/Behavioral: The patient is not nervous/anxious.    Physical Exam  Constitutional: She is oriented to person, place, and time. She appears well-developed and well-nourished. No distress.  Neck: No thyromegaly present.  Cardiovascular: Normal rate, regular rhythm, normal heart sounds and intact distal pulses.  Pulmonary/Chest: Effort normal and breath sounds normal. No respiratory distress.  Abdominal: Soft. Bowel sounds are normal. She exhibits no distension. There is no tenderness.  Musculoskeletal: She exhibits no edema.  Is able to move all extremities Is status post right  femoral neck fracture   Lymphadenopathy:    She has no cervical adenopathy.  Neurological: She is alert and oriented to person, place, and time.  Skin: Skin is warm and dry. She is not diaphoretic.  Psychiatric: She has a normal mood and affect.     ASSESSMENT/ PLAN:  TODAY  1. Essential hypertension benign: is stable b/p 117/55: will continue norvasc 5 mg daily; apresoline 25 mg twice daily asa 81 mg daily   2. Cerebrovascular accident (CVA) due to occlusion of left posterior cerebral artery: is neurologically stable will continue asa 81 mg daily and plavix 75 mg daily   3. Fracture of femoral neck right: is stable will continue therapy as directed and will follow up with orthopedics. Will continue lovenox 40 mg daily for 14 days; will continue oxycodone 5 mg every 4 hours as needed  4. Pernicious anemia: is stable will continue vit B 12 injection monthly  5.  Acquired hypothyroidism: is stable tsh 4.294 will continue synthroid 25 mcg daily  6. Dyslipidemia: is stable LDL 82 will continue crestor 5 mg daily   7. Major depression single episode mild: is stable will continue paxil 10 mg daily  8. Peripheral neuropathy: is stable will continue neurontin 100 mg twice daily   9. Glaucoma: is stable will continue cosopt to left eye twice daily and xalatan to both eyes nightly     MD is aware of resident's narcotic use and is in agreement with current plan of care. We will attempt to wean resident as apropriate   Ok Edwards NP [facility]  Contact [phone] Monday through Friday 8am- 5pm  After hours call [phone]

## 2018-03-07 DIAGNOSIS — I63532 Cerebral infarction due to unspecified occlusion or stenosis of left posterior cerebral artery: Secondary | ICD-10-CM | POA: Insufficient documentation

## 2018-03-07 DIAGNOSIS — I10 Essential (primary) hypertension: Secondary | ICD-10-CM | POA: Insufficient documentation

## 2018-03-07 DIAGNOSIS — H409 Unspecified glaucoma: Secondary | ICD-10-CM | POA: Insufficient documentation

## 2018-03-07 DIAGNOSIS — F320 Major depressive disorder, single episode, mild: Secondary | ICD-10-CM | POA: Insufficient documentation

## 2018-03-07 DIAGNOSIS — E039 Hypothyroidism, unspecified: Secondary | ICD-10-CM | POA: Insufficient documentation

## 2018-03-07 DIAGNOSIS — G629 Polyneuropathy, unspecified: Secondary | ICD-10-CM | POA: Insufficient documentation

## 2018-03-07 DIAGNOSIS — D510 Vitamin B12 deficiency anemia due to intrinsic factor deficiency: Secondary | ICD-10-CM | POA: Insufficient documentation

## 2018-03-07 DIAGNOSIS — E785 Hyperlipidemia, unspecified: Secondary | ICD-10-CM | POA: Insufficient documentation

## 2018-03-07 DIAGNOSIS — S72001A Fracture of unspecified part of neck of right femur, initial encounter for closed fracture: Secondary | ICD-10-CM | POA: Insufficient documentation

## 2018-03-12 ENCOUNTER — Encounter: Payer: Self-pay | Admitting: Adult Health

## 2018-03-12 ENCOUNTER — Non-Acute Institutional Stay (SKILLED_NURSING_FACILITY): Payer: Medicare Other | Admitting: Adult Health

## 2018-03-12 ENCOUNTER — Other Ambulatory Visit: Payer: Self-pay | Admitting: Adult Health

## 2018-03-12 DIAGNOSIS — I63532 Cerebral infarction due to unspecified occlusion or stenosis of left posterior cerebral artery: Secondary | ICD-10-CM

## 2018-03-12 DIAGNOSIS — I10 Essential (primary) hypertension: Secondary | ICD-10-CM

## 2018-03-12 DIAGNOSIS — S72001S Fracture of unspecified part of neck of right femur, sequela: Secondary | ICD-10-CM

## 2018-03-12 MED ORDER — OXYCODONE HCL 5 MG PO TABS: 5.0000 mg | ORAL_TABLET | ORAL | 0 refills | Status: DC | PRN

## 2018-03-12 NOTE — Progress Notes (Signed)
Location:   [facility] Room Number: [place] of Service:  SNF (31)   CODE STATUS: Full code  Allergies  Allergen Reactions  . Lincocin [Lincomycin Hcl] Swelling    angioedema  . Chlorzoxazone Other (See Comments)    Unspecified  . Codeine     Severe headache  . Doxycycline   . Hydrochlorothiazide Other (See Comments)  . Hydrocodone Nausea Only and Other (See Comments)    fainted  . Metronidazole Itching    severe  . Penicillins Swelling  . Tramadol Nausea And Vomiting  . Betadine [Povidone Iodine] Rash    Itching  . Iodine Rash    itching    Chief Complaint  Patient presents with  . Medical Management of Chronic Issues    Closed fracture of neck of right femur sequela; essential hypertension benign; cerebrovascular accident (CVA) due to occlusion of left posterior cerebral artery. Weekly follow up for the first 30 days post hospitalization.     HPI:  She is a 84 year old short term rehab patient being seen for the management of her chronic illnesses: right femur fracture; hypertension; cva. She is doing well in therapy for her right femur fracture. She denies any uncontrolled pain; no changes in appetite; no insomnia; no anxiety.   Past Medical History:  Diagnosis Date  . Arthritis    hips  . Breast cancer ([place])   . Depression   . Hypertension   . Hypothyroidism   . Migraines    none in 8-10 yrs  . Wears hearing aid    bilateral    Past Surgical History:  Procedure Laterality Date  . BREAST LUMPECTOMY    . CATARACT EXTRACTION W/PHACO Left 06/21/2015   Procedure: CATARACT EXTRACTION PHACO AND INTRAOCULAR LENS PLACEMENT (IOC);  Surgeon: Ronnell Freshwater, MD;  Location: [place];  Service: Ophthalmology;  Laterality: Left;  . CATARACT EXTRACTION W/PHACO Right 07/12/2015   Procedure: CATARACT EXTRACTION PHACO AND INTRAOCULAR LENS PLACEMENT ([place]) right;  Surgeon: Ronnell Freshwater, MD;  Location: [place];  Service: Ophthalmology;  Laterality: Right;  . HIP ARTHROPLASTY Right 03/01/2018   Procedure: ARTHROPLASTY BIPOLAR HIP (HEMIARTHROPLASTY);  Surgeon: Corky Mull, MD;  Location: [facility];  Service: Orthopedics;  Laterality: Right;  . JOINT REPLACEMENT Left    total hip    Social History   Socioeconomic History  . Marital status: Widowed    Spouse name: Not on file  . Number of children: Not on file  . Years of education: Not on file  . Highest education level: Not on file  Occupational History  . Occupation: retired  Scientific laboratory technician  . Financial resource strain: Not on file  . Food insecurity:    Worry: Not on file    Inability: Not on file  . Transportation needs:    Medical: Not on file    Non-medical: Not on file  Tobacco Use  . Smoking status: Never Smoker  . Smokeless tobacco: Never Used  Substance and Sexual Activity  . Alcohol use: No  . Drug use: Never  . Sexual activity: Not Currently  Lifestyle  . Physical activity:    Days per week: Not on file    Minutes per session: Not on file  . Stress: Not on file  Relationships  . Social connections:    Talks on phone: Not on file    Gets together: Not on file    Attends religious service: Not on file  Active member of club or organization: Not on file    Attends meetings of clubs or organizations: Not on file    Relationship status: Not on file  . Intimate partner violence:    Fear of current or ex partner: Not on file    Emotionally abused: Not on file    Physically abused: Not on file    Forced sexual activity: Not on file  Other Topics Concern  . Not on file  Social History Narrative  . Not on file   History reviewed. No pertinent family history.    VITAL SIGNS BP (!) 154/73   Pulse 86   Temp 98.7 F (37.1 C)   Resp 18   Ht 5\' 5"  (8.341 m)   Wt 177 lb 4.8 oz (80.4 kg)   SpO2 94%   BMI 29.50 kg/m   Outpatient Encounter Medications as of 03/12/2018  Medication Sig  .  acetaminophen (TYLENOL) 325 MG tablet Take 1-2 tablets (325-650 mg total) by mouth every 6 (six) hours as needed for mild pain or moderate pain (pain score 1-3 or temp > 100.5).  Marland Kitchen amLODipine (NORVASC) 5 MG tablet Take 1 tablet (5 mg total) by mouth daily.  Marland Kitchen aspirin 81 MG tablet Take 81 mg by mouth daily.  . bisacodyl (DULCOLAX) 10 MG suppository Place 1 suppository (10 mg total) rectally daily as needed for moderate constipation.  . cholecalciferol (VITAMIN D3) 25 MCG (1000 UT) tablet Give 4 tablets (4000 units) by mouth daily  . clopidogrel (PLAVIX) 75 MG tablet Take 1 tablet (75 mg total) by mouth daily.  . cyanocobalamin (,VITAMIN B-12,) 1000 MCG/ML injection Inject 1 mL into the muscle every 30 (thirty) days.  Marland Kitchen docusate sodium (COLACE) 100 MG capsule Take 1 capsule (100 mg total) by mouth 2 (two) times daily.  . dorzolamide-timolol (COSOPT) 22.3-6.8 MG/ML ophthalmic solution Place 1 drop into the left eye 2 (two) times daily.   Marland Kitchen enoxaparin (LOVENOX) 40 MG/0.4ML injection Inject 0.4 mLs (40 mg total) into the skin daily for 14 days.  Marland Kitchen gabapentin (NEURONTIN) 100 MG capsule Take 100 mg by mouth 2 (two) times daily.  . hydrALAZINE (APRESOLINE) 25 MG tablet Take 25 mg by mouth every 12 (twelve) hours.  Marland Kitchen latanoprost (XALATAN) 0.005 % ophthalmic solution Place 1 drop into both eyes at bedtime.  Marland Kitchen levothyroxine (SYNTHROID, LEVOTHROID) 25 MCG tablet Take 25 mcg by mouth daily before breakfast.  . Multiple Vitamin (MULTIVITAMIN) capsule Take 1 capsule by mouth daily.  . NON FORMULARY Diet Type: Regular  . Nutritional Supplements (GRAPESEED EXTRACT PO) Take 100 mg by mouth daily.   Marland Kitchen PARoxetine (PAXIL) 10 MG tablet Take 5 mg by mouth daily.  . rosuvastatin (CRESTOR) 5 MG tablet Take 1 tablet (5 mg total) by mouth daily at 6 PM.  . SALINE NASAL SPRAY NA Place 2 sprays into both nostrils as needed.  . senna-docusate (SENOKOT-S) 8.6-50 MG tablet Take 1 tablet by mouth at bedtime as needed for mild  constipation.  . [DISCONTINUED] Calcium Citrate-Vitamin D (CALCIUM + D PO) Take 1 tablet by mouth 2 (two) times daily.   . [DISCONTINUED] hydrALAZINE (APRESOLINE) 25 MG tablet Take 1 tablet (25 mg total) by mouth 3 (three) times daily. (Patient not taking: Reported on 03/12/2018)  . [DISCONTINUED] oxyCODONE (OXY IR/ROXICODONE) 5 MG immediate release tablet Take 5 mg every 4 hours as needed for pain rating 5-10/10 (Patient not taking: Reported on 03/12/2018)   No facility-administered encounter medications on file as of 03/12/2018.  SIGNIFICANT DIAGNOSTIC EXAMS  PREVIOUS:   03-01-18: right hip and pelvic x-ray:  Subcapital femoral neck fracture on the right with impaction at the fracture site and varus angulation at the fracture site. No other acute fracture. Old healed fractures of each medial ischium with remodeling.  03-01-18: chest x-ray: No edema or consolidation. Stable cardiac silhouette. Hiatal hernia present. Aortic atherosclerosis present. Bones osteoporotic. Aortic Atherosclerosis   03-02-18: right ankle x-ray: Osteopenia.  No acute finding by plain radiography  NO NEW EXAMS.   LABS REVIEWED PREVIOUS;   08-20-17: chol 153; ldl 82; trig 68; hdl 57.1  tsh 4.294  03-01-18: wbc 15.6; hgb 14.7; hct 45.0; mcv 96.4; plt 197; glucose 129; bun 18; creat 0.74; k+ 3.6; na++ 141; ca 9.1; liver normal albumin 4.0 03-02-18: wbc 12.4 hgb 12.9; hct 40.1; mcv 98.8; plt 173 glucose 178; bun 23; creat 0.83; k+ 3.9; na++ 138; ca 8.3 03-03-18: wbc 12.4; hgb 10.9; hct 33.4; mcv 96.8; plt 158   NO NEW LABS.    Review of Systems  Constitutional: Negative for malaise/fatigue.  Respiratory: Negative for cough and shortness of breath.   Cardiovascular: Negative for chest pain, palpitations and leg swelling.  Gastrointestinal: Negative for abdominal pain, constipation and heartburn.  Musculoskeletal: Negative for back pain, joint pain and myalgias.  Skin: Negative.   Neurological: Negative  for dizziness.  Psychiatric/Behavioral: The patient is not nervous/anxious.    Physical Exam Constitutional:      General: She is not in acute distress.    Appearance: Normal appearance. She is well-developed. She is not diaphoretic.  Neck:     Musculoskeletal: Normal range of motion and neck supple.     Thyroid: No thyromegaly.  Cardiovascular:     Rate and Rhythm: Normal rate and regular rhythm.     Heart sounds: Normal heart sounds.  Pulmonary:     Effort: Pulmonary effort is normal. No respiratory distress.     Breath sounds: Normal breath sounds.  Abdominal:     General: Bowel sounds are normal. There is no distension.     Palpations: Abdomen is soft.     Tenderness: There is no abdominal tenderness.  Musculoskeletal:     Comments: Is able to move all extremities Is status post right femoral neck fracture    Lymphadenopathy:     Cervical: No cervical adenopathy.  Skin:    General: Skin is warm and dry.     Comments: Incision line without signs of infection   Neurological:     Mental Status: She is alert and oriented to person, place, and time. Mental status is at baseline.  Psychiatric:        Mood and Affect: Mood normal.     ASSESSMENT/ PLAN:  TODAY  1. Essential hypertension benign: is stable b/p 154/73: will continue norvasc 5 mg daily; apresoline 25 mg twice daily asa 81 mg daily   2. Cerebrovascular accident (CVA) due to occlusion of left posterior cerebral artery: is neurologically stable will continue asa 81 mg daily and plavix 75 mg daily   3. Fracture of femoral neck right: is stable will continue therapy as directed and will follow up with orthopedics. Will continue lovenox 40 mg daily for 14 days; will continue oxycodone 5 mg every 4 hours as needed  PREVIOUS  4. Pernicious anemia: is stable will continue vit B 12 injection monthly  5.  Acquired hypothyroidism: is stable tsh 4.294 will continue synthroid 25 mcg daily  6. Dyslipidemia: is stable LDL  82  will continue crestor 5 mg daily   7. Major depression single episode mild: is stable will continue paxil 5 mg daily  8. Peripheral neuropathy: is stable will continue neurontin 100 mg twice daily   9. Glaucoma: is stable will continue cosopt to left eye twice daily and xalatan to both eyes nightly    MD is aware of resident's narcotic use and is in agreement with current plan of care. We will attempt to wean resident as apropriate   Ok Edwards NP [facility]  Contact [phone] Monday through Friday 8am- 5pm  After hours call [phone]

## 2018-03-19 ENCOUNTER — Non-Acute Institutional Stay (SKILLED_NURSING_FACILITY): Payer: Medicare Other | Admitting: Adult Health

## 2018-03-19 ENCOUNTER — Encounter: Payer: Self-pay | Admitting: Adult Health

## 2018-03-19 DIAGNOSIS — E785 Hyperlipidemia, unspecified: Secondary | ICD-10-CM

## 2018-03-19 DIAGNOSIS — D510 Vitamin B12 deficiency anemia due to intrinsic factor deficiency: Secondary | ICD-10-CM | POA: Diagnosis not present

## 2018-03-19 DIAGNOSIS — E039 Hypothyroidism, unspecified: Secondary | ICD-10-CM | POA: Diagnosis not present

## 2018-03-19 NOTE — Progress Notes (Signed)
Location:   [facility] Room Number: 218 A Place of Service:  SNF (31)   CODE STATUS:  Full Code  Allergies  Allergen Reactions  . Lincocin [Lincomycin Hcl] Swelling    angioedema  . Chlorzoxazone Other (See Comments)    Unspecified  . Codeine     Severe headache  . Doxycycline   . Hydrochlorothiazide Other (See Comments)  . Hydrocodone Nausea Only and Other (See Comments)    fainted  . Metronidazole Itching    severe  . Penicillins Swelling  . Tramadol Nausea And Vomiting  . Betadine [Povidone Iodine] Rash    Itching  . Iodine Rash    itching    Chief Complaint  Patient presents with  . Medical Management of Chronic Issues    Acquired hypothyroidism; dyslipidemia; pernicious anemia. Weekly follow for the first 30 days post hospitalization.     HPI:  She is a 84 year old short term resident of this facility being seen for the management of her chronic illnesses: hypothyroidism; dyslipidemia; pernicious anemia. Her synthroid was stopped after learning that she has not taken this medication for over 2 years. She denies any uncontrolled hip pain; no changes in appetite; no anxiety or insomnia.   Past Medical History:  Diagnosis Date  . Arthritis    hips  . Breast cancer ([place])   . Depression   . Hypertension   . Hypothyroidism   . Migraines    none in 8-10 yrs  . Wears hearing aid    bilateral    Past Surgical History:  Procedure Laterality Date  . BREAST LUMPECTOMY    . CATARACT EXTRACTION W/PHACO Left 06/21/2015   Procedure: CATARACT EXTRACTION PHACO AND INTRAOCULAR LENS PLACEMENT (IOC);  Surgeon: Ronnell Freshwater, MD;  Location: [place];  Service: Ophthalmology;  Laterality: Left;  . CATARACT EXTRACTION W/PHACO Right 07/12/2015   Procedure: CATARACT EXTRACTION PHACO AND INTRAOCULAR LENS PLACEMENT ([place]) right;  Surgeon: Ronnell Freshwater, MD;  Location: [place];  Service: Ophthalmology;   Laterality: Right;  . HIP ARTHROPLASTY Right 03/01/2018   Procedure: ARTHROPLASTY BIPOLAR HIP (HEMIARTHROPLASTY);  Surgeon: Corky Mull, MD;  Location: [facility];  Service: Orthopedics;  Laterality: Right;  . JOINT REPLACEMENT Left    total hip    Social History   Socioeconomic History  . Marital status: Widowed    Spouse name: Not on file  . Number of children: Not on file  . Years of education: Not on file  . Highest education level: Not on file  Occupational History  . Occupation: retired  Scientific laboratory technician  . Financial resource strain: Not on file  . Food insecurity:    Worry: Not on file    Inability: Not on file  . Transportation needs:    Medical: Not on file    Non-medical: Not on file  Tobacco Use  . Smoking status: Never Smoker  . Smokeless tobacco: Never Used  Substance and Sexual Activity  . Alcohol use: No  . Drug use: Never  . Sexual activity: Not Currently  Lifestyle  . Physical activity:    Days per week: Not on file    Minutes per session: Not on file  . Stress: Not on file  Relationships  . Social connections:    Talks on phone: Not on file    Gets together: Not on file    Attends religious service: Not on file    Active member of club or organization:  Not on file    Attends meetings of clubs or organizations: Not on file    Relationship status: Not on file  . Intimate partner violence:    Fear of current or ex partner: Not on file    Emotionally abused: Not on file    Physically abused: Not on file    Forced sexual activity: Not on file  Other Topics Concern  . Not on file  Social History Narrative  . Not on file   History reviewed. No pertinent family history.    VITAL SIGNS BP (!) 138/59   Pulse 86   Temp 98.5 F (36.9 C)   Resp 18   Ht 5\' 5"  (1.651 m)   Wt 173 lb 4.8 oz (78.6 kg)   SpO2 91%   BMI 28.84 kg/m   Outpatient Encounter Medications as of 03/19/2018  Medication Sig  . acetaminophen (TYLENOL) 325 MG tablet Take 650 mg  by mouth every 6 (six) hours as needed.  Marland Kitchen amLODipine (NORVASC) 5 MG tablet Take 1 tablet (5 mg total) by mouth daily.  Marland Kitchen aspirin 81 MG tablet Take 81 mg by mouth daily.  . bisacodyl (DULCOLAX) 10 MG suppository Place 1 suppository (10 mg total) rectally daily as needed for moderate constipation.  . cholecalciferol (VITAMIN D3) 25 MCG (1000 UT) tablet Give 4 tablets (4000 units) by mouth daily  . clopidogrel (PLAVIX) 75 MG tablet Take 1 tablet (75 mg total) by mouth daily.  . cyanocobalamin (,VITAMIN B-12,) 1000 MCG/ML injection Inject 1 mL into the muscle every 30 (thirty) days.  Marland Kitchen docusate sodium (COLACE) 100 MG capsule Take 1 capsule (100 mg total) by mouth 2 (two) times daily.  . dorzolamide-timolol (COSOPT) 22.3-6.8 MG/ML ophthalmic solution Place 1 drop into the left eye 2 (two) times daily.   Marland Kitchen gabapentin (NEURONTIN) 100 MG capsule Take 100 mg by mouth 2 (two) times daily.  . hydrALAZINE (APRESOLINE) 25 MG tablet Take 25 mg by mouth every 12 (twelve) hours.  Marland Kitchen latanoprost (XALATAN) 0.005 % ophthalmic solution Place 1 drop into both eyes at bedtime.  . Multiple Vitamin (MULTIVITAMIN) capsule Take 1 capsule by mouth daily.  . NON FORMULARY Diet Type: Regular  . oxyCODONE (OXY IR/ROXICODONE) 5 MG immediate release tablet Take 5 mg by mouth every 4 (four) hours as needed for severe pain.  Marland Kitchen PARoxetine (PAXIL) 10 MG tablet Take 5 mg by mouth daily.  . rosuvastatin (CRESTOR) 5 MG tablet Take 1 tablet (5 mg total) by mouth daily at 6 PM.  . SALINE NASAL SPRAY NA Place 2 sprays into both nostrils as needed.  . senna-docusate (SENOKOT-S) 8.6-50 MG tablet Take 1 tablet by mouth at bedtime as needed for mild constipation.   No facility-administered encounter medications on file as of 03/19/2018.      SIGNIFICANT DIAGNOSTIC EXAMS  PREVIOUS:   03-01-18: right hip and pelvic x-ray:  Subcapital femoral neck fracture on the right with impaction at the fracture site and varus angulation at the  fracture site. No other acute fracture. Old healed fractures of each medial ischium with remodeling.  03-01-18: chest x-ray: No edema or consolidation. Stable cardiac silhouette. Hiatal hernia present. Aortic atherosclerosis present. Bones osteoporotic. Aortic Atherosclerosis   03-02-18: right ankle x-ray: Osteopenia.  No acute finding by plain radiography  NO NEW EXAMS.   LABS REVIEWED PREVIOUS;   08-20-17: chol 153; ldl 82; trig 68; hdl 57.1  tsh 4.294  03-01-18: wbc 15.6; hgb 14.7; hct 45.0; mcv 96.4; plt 197; glucose  129; bun 18; creat 0.74; k+ 3.6; na++ 141; ca 9.1; liver normal albumin 4.0 03-02-18: wbc 12.4 hgb 12.9; hct 40.1; mcv 98.8; plt 173 glucose 178; bun 23; creat 0.83; k+ 3.9; na++ 138; ca 8.3 03-03-18: wbc 12.4; hgb 10.9; hct 33.4; mcv 96.8; plt 158   NO NEW LABS.    Review of Systems  Constitutional: Negative for malaise/fatigue.  Respiratory: Negative for cough and shortness of breath.   Cardiovascular: Negative for chest pain, palpitations and leg swelling.  Gastrointestinal: Negative for abdominal pain, constipation and heartburn.  Musculoskeletal: Negative for back pain, joint pain and myalgias.  Skin: Negative.   Neurological: Negative for dizziness.  Psychiatric/Behavioral: The patient is not nervous/anxious.     Physical Exam Constitutional:      General: She is not in acute distress.    Appearance: Normal appearance. She is well-developed. She is not diaphoretic.  Neck:     Musculoskeletal: Neck supple.     Thyroid: No thyromegaly.  Cardiovascular:     Rate and Rhythm: Normal rate and regular rhythm.     Pulses: Normal pulses.     Heart sounds: Normal heart sounds.  Pulmonary:     Effort: Pulmonary effort is normal. No respiratory distress.     Breath sounds: Normal breath sounds.  Abdominal:     General: Bowel sounds are normal. There is no distension.     Palpations: Abdomen is soft.     Tenderness: There is no abdominal tenderness.    Musculoskeletal:     Right lower leg: No edema.     Left lower leg: No edema.     Comments: Is able to move all extremities Is status post right femoral neck fracture    Lymphadenopathy:     Cervical: No cervical adenopathy.  Skin:    General: Skin is warm and dry.     Comments: Incision line without signs of infection   Neurological:     General: No focal deficit present.     Mental Status: She is alert and oriented to person, place, and time.     ASSESSMENT/ PLAN:  TODAY  1. Pernicious anemia: is stable will continue vit B 12 injection monthly  2.  Acquired hypothyroidism: is stable tsh 4.294 her synthroid was stopped; her family states she has not been on medication for over 2 years.   3. Dyslipidemia: is stable LDL 82 will continue crestor 5 mg daily   PREVIOUS  4. Major depression single episode mild: is stable will continue paxil 5 mg daily  5. Peripheral neuropathy: is stable will continue neurontin 100 mg twice daily   6. Glaucoma: is stable will continue cosopt to left eye twice daily and xalatan to both eyes nightly  7. Essential hypertension benign: is stable b/p 138/59: will continue norvasc 5 mg daily; apresoline 25 mg twice daily asa 81 mg daily   8. Cerebrovascular accident (CVA) due to occlusion of left posterior cerebral artery: is neurologically stable will continue asa 81 mg daily and plavix 75 mg daily   9. Fracture of femoral neck right: is stable will continue therapy as directed and will follow up with orthopedics. Will continue oxycodone 5 mg every 4 hours as needed     MD is aware of resident's narcotic use and is in agreement with current plan of care. We will attempt to wean resident as apropriate   Ok Edwards NP [facility]  Contact [phone] Monday through Friday 8am- 5pm  After hours call [phone]

## 2018-03-25 ENCOUNTER — Non-Acute Institutional Stay (SKILLED_NURSING_FACILITY): Payer: Medicare Other | Admitting: Adult Health

## 2018-03-25 ENCOUNTER — Encounter: Payer: Self-pay | Admitting: Adult Health

## 2018-03-25 DIAGNOSIS — I63532 Cerebral infarction due to unspecified occlusion or stenosis of left posterior cerebral artery: Secondary | ICD-10-CM | POA: Diagnosis not present

## 2018-03-25 DIAGNOSIS — I10 Essential (primary) hypertension: Secondary | ICD-10-CM | POA: Diagnosis not present

## 2018-03-25 DIAGNOSIS — S72001S Fracture of unspecified part of neck of right femur, sequela: Secondary | ICD-10-CM | POA: Diagnosis not present

## 2018-03-25 NOTE — Progress Notes (Signed)
Location: [place] Room Number: 218A Place of Service:  SNF (31)   CODE STATUS: FULL  Allergies  Allergen Reactions  . Lincocin [Lincomycin Hcl] Swelling    angioedema  . Chlorzoxazone Other (See Comments)    Unspecified  . Codeine     Severe headache  . Doxycycline   . Hydrochlorothiazide Other (See Comments)  . Hydrocodone Nausea Only and Other (See Comments)    fainted  . Metronidazole Itching    severe  . Penicillins Swelling  . Tramadol Nausea And Vomiting  . Betadine [Povidone Iodine] Rash    Itching  . Iodine Rash    itching    Chief Complaint  Patient presents with  . Medical Management of Chronic Issues    Essential hypertension benign; cerebrovascular accident (CVA): due to occlusion of left posterior cerebral artery; closed fracture of neck of right femur; weekly follow up for the first 30 days post hospitalization.     HPI:  She is a 84 year old short term patient of this facility being seen for the management of her chronic illnesses: hypertension; cva; right femur fracture. She denies any uncontrolled pain; no changes in appetite; no anxiety or insomnia.   Past Medical History:  Diagnosis Date  . Arthritis    hips  . Breast cancer ([place])   . Depression   . Hypertension   . Hypothyroidism   . Migraines    none in 8-10 yrs  . Wears hearing aid    bilateral    Past Surgical History:  Procedure Laterality Date  . BREAST LUMPECTOMY    . CATARACT EXTRACTION W/PHACO Left 06/21/2015   Procedure: CATARACT EXTRACTION PHACO AND INTRAOCULAR LENS PLACEMENT (IOC);  Surgeon: Ronnell Freshwater, MD;  Location: [place];  Service: Ophthalmology;  Laterality: Left;  . CATARACT EXTRACTION W/PHACO Right 07/12/2015   Procedure: CATARACT EXTRACTION PHACO AND INTRAOCULAR LENS PLACEMENT ([place]) right;  Surgeon: Ronnell Freshwater, MD;  Location: [place];  Service: Ophthalmology;  Laterality: Right;  . HIP  ARTHROPLASTY Right 03/01/2018   Procedure: ARTHROPLASTY BIPOLAR HIP (HEMIARTHROPLASTY);  Surgeon: Corky Mull, MD;  Location: [facility];  Service: Orthopedics;  Laterality: Right;  . JOINT REPLACEMENT Left    total hip    Social History   Socioeconomic History  . Marital status: Widowed    Spouse name: Not on file  . Number of children: Not on file  . Years of education: Not on file  . Highest education level: Not on file  Occupational History  . Occupation: retired  Scientific laboratory technician  . Financial resource strain: Not on file  . Food insecurity:    Worry: Not on file    Inability: Not on file  . Transportation needs:    Medical: Not on file    Non-medical: Not on file  Tobacco Use  . Smoking status: Never Smoker  . Smokeless tobacco: Never Used  Substance and Sexual Activity  . Alcohol use: No  . Drug use: Never  . Sexual activity: Not Currently  Lifestyle  . Physical activity:    Days per week: Not on file    Minutes per session: Not on file  . Stress: Not on file  Relationships  . Social connections:    Talks on phone: Not on file    Gets together: Not on file    Attends religious service: Not on file    Active member of club or organization: Not on file  Attends meetings of clubs or organizations: Not on file    Relationship status: Not on file  . Intimate partner violence:    Fear of current or ex partner: Not on file    Emotionally abused: Not on file    Physically abused: Not on file    Forced sexual activity: Not on file  Other Topics Concern  . Not on file  Social History Narrative  . Not on file   History reviewed. No pertinent family history.    VITAL SIGNS BP (!) 160/64   Pulse 79   Temp 98.6 F (37 C) (Oral)   Resp 16   Ht 5\' 5"  (1.651 m)   Wt 173 lb 4.8 oz (78.6 kg)   SpO2 98%   BMI 28.84 kg/m   Outpatient Encounter Medications as of 03/25/2018  Medication Sig  . acetaminophen (TYLENOL) 325 MG tablet Take 650 mg by mouth every 6 (six)  hours as needed. for mild to moderate pain or fever >100.5  . amLODipine (NORVASC) 5 MG tablet Take 1 tablet (5 mg total) by mouth daily.  Marland Kitchen aspirin 81 MG tablet Take 81 mg by mouth daily.  . bisacodyl (DULCOLAX) 10 MG suppository Place 1 suppository (10 mg total) rectally daily as needed for moderate constipation.  . cholecalciferol (VITAMIN D3) 25 MCG (1000 UT) tablet Give 4 tablets (4000 units) by mouth daily  . clopidogrel (PLAVIX) 75 MG tablet Take 1 tablet (75 mg total) by mouth daily.  . cyanocobalamin (,VITAMIN B-12,) 1000 MCG/ML injection Inject 1 mL into the muscle every 30 (thirty) days.  Marland Kitchen docusate sodium (COLACE) 100 MG capsule Take 1 capsule (100 mg total) by mouth 2 (two) times daily.  . dorzolamide-timolol (COSOPT) 22.3-6.8 MG/ML ophthalmic solution Place 1 drop into the left eye 2 (two) times daily.   Marland Kitchen gabapentin (NEURONTIN) 100 MG capsule Take 100 mg by mouth 2 (two) times daily.  . hydrALAZINE (APRESOLINE) 25 MG tablet Take 25 mg by mouth every 12 (twelve) hours.  Marland Kitchen latanoprost (XALATAN) 0.005 % ophthalmic solution Place 1 drop into both eyes at bedtime. glaucoma- wait 5 minutes between multiple drops  . Multiple Vitamin (MULTIVITAMIN) capsule Take 1 capsule by mouth daily.  . NON FORMULARY Diet Type: Regular  . oxyCODONE (OXY IR/ROXICODONE) 5 MG immediate release tablet Take 5 mg by mouth every 4 (four) hours as needed for moderate pain or severe pain.   Marland Kitchen PARoxetine (PAXIL) 10 MG tablet Take 5 mg by mouth daily.  . rosuvastatin (CRESTOR) 5 MG tablet Take 1 tablet (5 mg total) by mouth daily at 6 PM.  . SALINE NASAL SPRAY NA Place 2 sprays into both nostrils as needed.  . senna-docusate (SENOKOT-S) 8.6-50 MG tablet Take 1 tablet by mouth at bedtime as needed for mild constipation.   No facility-administered encounter medications on file as of 03/25/2018.      SIGNIFICANT DIAGNOSTIC EXAMS    PREVIOUS:   03-01-18: right hip and pelvic x-ray:  Subcapital femoral neck  fracture on the right with impaction at the fracture site and varus angulation at the fracture site. No other acute fracture. Old healed fractures of each medial ischium with remodeling.  03-01-18: chest x-ray: No edema or consolidation. Stable cardiac silhouette. Hiatal hernia present. Aortic atherosclerosis present. Bones osteoporotic. Aortic Atherosclerosis   03-02-18: right ankle x-ray: Osteopenia.  No acute finding by plain radiography  NO NEW EXAMS.   LABS REVIEWED PREVIOUS;   08-20-17: chol 153; ldl 82; trig 68; hdl 57.1  tsh 4.294  03-01-18: wbc 15.6; hgb 14.7; hct 45.0; mcv 96.4; plt 197; glucose 129; bun 18; creat 0.74; k+ 3.6; na++ 141; ca 9.1; liver normal albumin 4.0 03-02-18: wbc 12.4 hgb 12.9; hct 40.1; mcv 98.8; plt 173 glucose 178; bun 23; creat 0.83; k+ 3.9; na++ 138; ca 8.3 03-03-18: wbc 12.4; hgb 10.9; hct 33.4; mcv 96.8; plt 158   NO NEW LABS.    Review of Systems  Constitutional: Negative for malaise/fatigue.  Respiratory: Negative for cough and shortness of breath.   Cardiovascular: Negative for chest pain, palpitations and leg swelling.  Gastrointestinal: Negative for abdominal pain, constipation and heartburn.  Musculoskeletal: Negative for back pain, joint pain and myalgias.  Skin: Negative.   Neurological: Negative for dizziness.  Psychiatric/Behavioral: The patient is not nervous/anxious.      Physical Exam Constitutional:      General: She is not in acute distress.    Appearance: She is well-developed. She is not diaphoretic.  Neck:     Musculoskeletal: Neck supple.     Thyroid: No thyromegaly.  Cardiovascular:     Rate and Rhythm: Normal rate and regular rhythm.     Pulses: Normal pulses.     Heart sounds: Normal heart sounds.  Pulmonary:     Effort: Pulmonary effort is normal. No respiratory distress.     Breath sounds: Normal breath sounds.  Abdominal:     General: Bowel sounds are normal. There is no distension.     Palpations: Abdomen is  soft.     Tenderness: There is no abdominal tenderness.  Musculoskeletal:     Right lower leg: No edema.     Left lower leg: No edema.     Comments: Is able to move all extremities Is status post right femoral neck fracture     Lymphadenopathy:     Cervical: No cervical adenopathy.  Skin:    General: Skin is warm and dry.     Comments: Incision line without signs of infection    Neurological:     Mental Status: She is alert. Mental status is at baseline.  Psychiatric:        Mood and Affect: Mood normal.       ASSESSMENT/ PLAN:  TODAY  1. Essential hypertension benign: is stable b/p 160/64: will continue norvasc 5 mg daily; apresoline 25 mg twice daily asa 81 mg daily   2. Cerebrovascular accident (CVA) due to occlusion of left posterior cerebral artery: is neurologically stable will continue asa 81 mg daily and plavix 75 mg daily   3. Fracture of femoral neck right: is stable will continue therapy as directed and will follow up with orthopedics. Will continue oxycodone 5 mg every 4 hours as needed  PREVIOUS  4. Major depression single episode mild: is stable will continue paxil 5 mg daily  5. Peripheral neuropathy: is stable will continue neurontin 100 mg twice daily   6. Glaucoma: is stable will continue cosopt to left eye twice daily and xalatan to both eyes nightly  7. Pernicious anemia: is stable will continue vit B 12 injection monthly  8.  Acquired hypothyroidism: is stable tsh 4.294 her synthroid was stopped; her family states she has not been on medication for over 2 years.   0. Dyslipidemia: is stable LDL 82 will continue crestor 5 mg daily     MD is aware of resident's narcotic use and is in agreement with current plan of care. We will attempt to wean resident as apropriate  Ok Edwards NP [facility]  Contact [phone] Monday through Friday 8am- 5pm  After hours call [phone]

## 2018-03-28 ENCOUNTER — Non-Acute Institutional Stay (SKILLED_NURSING_FACILITY): Payer: Medicare Other | Admitting: Adult Health

## 2018-03-28 DIAGNOSIS — S72001S Fracture of unspecified part of neck of right femur, sequela: Secondary | ICD-10-CM | POA: Diagnosis not present

## 2018-03-28 DIAGNOSIS — F320 Major depressive disorder, single episode, mild: Secondary | ICD-10-CM

## 2018-03-28 DIAGNOSIS — I63532 Cerebral infarction due to unspecified occlusion or stenosis of left posterior cerebral artery: Secondary | ICD-10-CM | POA: Diagnosis not present

## 2018-03-29 ENCOUNTER — Encounter: Payer: Self-pay | Admitting: Adult Health

## 2018-03-29 NOTE — Progress Notes (Signed)
Location:   [facility] Room Number: 2 Place of Service:  SNF (31)    CODE STATUS: full code   Allergies  Allergen Reactions  . Lincocin [Lincomycin Hcl] Swelling    angioedema  . Chlorzoxazone Other (See Comments)    Unspecified  . Codeine     Severe headache  . Doxycycline   . Hydrochlorothiazide Other (See Comments)  . Hydrocodone Nausea Only and Other (See Comments)    fainted  . Metronidazole Itching    severe  . Penicillins Swelling  . Tramadol Nausea And Vomiting  . Betadine [Povidone Iodine] Rash    Itching  . Iodine Rash    itching    Chief Complaint  Patient presents with  . Discharge Note    to assisted living on 03-29-18     HPI:  She is being discharged to assisted living with home health for pt/ot/rn. She will not need dme; will not need any prescriptions written. She will follow up medically with the medical provider at the receiving facility. She had been hospitalized for a right hip fracture. She was admitted to this facility for short term rehab. She is not able to return to an independent living environment at this time; will be going to assisted living.      Past Medical History:  Diagnosis Date  . Arthritis    hips  . Breast cancer ([place])   . Depression   . Hypertension   . Hypothyroidism   . Migraines    none in 8-10 yrs  . Wears hearing aid    bilateral    Past Surgical History:  Procedure Laterality Date  . BREAST LUMPECTOMY    . CATARACT EXTRACTION W/PHACO Left 06/21/2015   Procedure: CATARACT EXTRACTION PHACO AND INTRAOCULAR LENS PLACEMENT (IOC);  Surgeon: Ronnell Freshwater, MD;  Location: [place];  Service: Ophthalmology;  Laterality: Left;  . CATARACT EXTRACTION W/PHACO Right 07/12/2015   Procedure: CATARACT EXTRACTION PHACO AND INTRAOCULAR LENS PLACEMENT ([place]) right;  Surgeon: Ronnell Freshwater, MD;  Location: [place];  Service: Ophthalmology;  Laterality: Right;  . HIP  ARTHROPLASTY Right 03/01/2018   Procedure: ARTHROPLASTY BIPOLAR HIP (HEMIARTHROPLASTY);  Surgeon: Corky Mull, MD;  Location: [facility];  Service: Orthopedics;  Laterality: Right;  . JOINT REPLACEMENT Left    total hip    Social History   Socioeconomic History  . Marital status: Widowed    Spouse name: Not on file  . Number of children: Not on file  . Years of education: Not on file  . Highest education level: Not on file  Occupational History  . Occupation: retired  Scientific laboratory technician  . Financial resource strain: Not on file  . Food insecurity:    Worry: Not on file    Inability: Not on file  . Transportation needs:    Medical: Not on file    Non-medical: Not on file  Tobacco Use  . Smoking status: Never Smoker  . Smokeless tobacco: Never Used  Substance and Sexual Activity  . Alcohol use: No  . Drug use: Never  . Sexual activity: Not Currently  Lifestyle  . Physical activity:    Days per week: Not on file    Minutes per session: Not on file  . Stress: Not on file  Relationships  . Social connections:    Talks on phone: Not on file    Gets together: Not on file    Attends religious service: Not on file  Active member of club or organization: Not on file    Attends meetings of clubs or organizations: Not on file    Relationship status: Not on file  . Intimate partner violence:    Fear of current or ex partner: Not on file    Emotionally abused: Not on file    Physically abused: Not on file    Forced sexual activity: Not on file  Other Topics Concern  . Not on file  Social History Narrative  . Not on file   History reviewed. No pertinent family history.  VITAL SIGNS BP 138/78   Pulse 80   Temp 98.1 F (36.7 C)   Resp 18   Ht 5\' 5"  (1.651 m)   Wt 173 lb (78.5 kg)   SpO2 95%   BMI 28.79 kg/m   Patient's Medications  New Prescriptions   No medications on file  Previous Medications   ACETAMINOPHEN (TYLENOL) 325 MG TABLET    Take 650 mg by mouth every 6  (six) hours as needed. for mild to moderate pain or fever >100.5   AMLODIPINE (NORVASC) 5 MG TABLET    Take 1 tablet (5 mg total) by mouth daily.   ASPIRIN 81 MG TABLET    Take 81 mg by mouth daily.   BISACODYL (DULCOLAX) 10 MG SUPPOSITORY    Place 1 suppository (10 mg total) rectally daily as needed for moderate constipation.   CHOLECALCIFEROL (VITAMIN D3) 25 MCG (1000 UT) TABLET    Give 4 tablets (4000 units) by mouth daily   CLOPIDOGREL (PLAVIX) 75 MG TABLET    Take 1 tablet (75 mg total) by mouth daily.   CYANOCOBALAMIN (,VITAMIN B-12,) 1000 MCG/ML INJECTION    Inject 1 mL into the muscle every 30 (thirty) days.   DOCUSATE SODIUM (COLACE) 100 MG CAPSULE    Take 1 capsule (100 mg total) by mouth 2 (two) times daily.   DORZOLAMIDE-TIMOLOL (COSOPT) 22.3-6.8 MG/ML OPHTHALMIC SOLUTION    Place 1 drop into the left eye 2 (two) times daily.    GABAPENTIN (NEURONTIN) 100 MG CAPSULE    Take 100 mg by mouth 2 (two) times daily.   HYDRALAZINE (APRESOLINE) 25 MG TABLET    Take 25 mg by mouth every 12 (twelve) hours.   LATANOPROST (XALATAN) 0.005 % OPHTHALMIC SOLUTION    Place 1 drop into both eyes at bedtime. glaucoma- wait 5 minutes between multiple drops   MULTIPLE VITAMIN (MULTIVITAMIN) CAPSULE    Take 1 capsule by mouth daily.   NON FORMULARY    Diet Type: Regular   OXYCODONE (OXY IR/ROXICODONE) 5 MG IMMEDIATE RELEASE TABLET    Take 5 mg by mouth every 4 (four) hours as needed for moderate pain or severe pain.    PAROXETINE (PAXIL) 10 MG TABLET    Take 5 mg by mouth daily.   ROSUVASTATIN (CRESTOR) 5 MG TABLET    Take 1 tablet (5 mg total) by mouth daily at 6 PM.   SALINE NASAL SPRAY NA    Place 2 sprays into both nostrils as needed.   SENNA-DOCUSATE (SENOKOT-S) 8.6-50 MG TABLET    Take 1 tablet by mouth at bedtime as needed for mild constipation.  Modified Medications   No medications on file  Discontinued Medications   No medications on file     SIGNIFICANT DIAGNOSTIC EXAMS     PREVIOUS:     03-01-18: right hip and pelvic x-ray:  Subcapital femoral neck fracture on the right with impaction at the fracture  site and varus angulation at the fracture site. No other acute fracture. Old healed fractures of each medial ischium with remodeling.  03-01-18: chest x-ray: No edema or consolidation. Stable cardiac silhouette. Hiatal hernia present. Aortic atherosclerosis present. Bones osteoporotic. Aortic Atherosclerosis   03-02-18: right ankle x-ray: Osteopenia.  No acute finding by plain radiography  NO NEW EXAMS.   LABS REVIEWED PREVIOUS;   08-20-17: chol 153; ldl 82; trig 68; hdl 57.1  tsh 4.294  03-01-18: wbc 15.6; hgb 14.7; hct 45.0; mcv 96.4; plt 197; glucose 129; bun 18; creat 0.74; k+ 3.6; na++ 141; ca 9.1; liver normal albumin 4.0 03-02-18: wbc 12.4 hgb 12.9; hct 40.1; mcv 98.8; plt 173 glucose 178; bun 23; creat 0.83; k+ 3.9; na++ 138; ca 8.3 03-03-18: wbc 12.4; hgb 10.9; hct 33.4; mcv 96.8; plt 158   NO NEW LABS.    Review of Systems  Constitutional: Negative for malaise/fatigue.  Respiratory: Negative for cough and shortness of breath.   Cardiovascular: Negative for chest pain, palpitations and leg swelling.  Gastrointestinal: Negative for abdominal pain, constipation and heartburn.  Musculoskeletal: Negative for back pain, joint pain and myalgias.  Skin: Negative.   Neurological: Negative for dizziness.  Psychiatric/Behavioral: The patient is not nervous/anxious.     Physical Exam Constitutional:      General: She is not in acute distress.    Appearance: Normal appearance. She is well-developed. She is not diaphoretic.  Neck:     Musculoskeletal: Neck supple.     Thyroid: No thyromegaly.  Cardiovascular:     Rate and Rhythm: Normal rate and regular rhythm.     Pulses: Normal pulses.     Heart sounds: Normal heart sounds.  Pulmonary:     Effort: Pulmonary effort is normal. No respiratory distress.     Breath sounds: Normal breath sounds.  Abdominal:      General: Bowel sounds are normal. There is no distension.     Palpations: Abdomen is soft.     Tenderness: There is no abdominal tenderness.  Musculoskeletal:     Right lower leg: No edema.     Left lower leg: No edema.     Comments: Is able to move all extremities Is status post right femoral neck fracture      Lymphadenopathy:     Cervical: No cervical adenopathy.  Skin:    General: Skin is warm and dry.     Comments:  Incision line without signs of infection     Neurological:     Mental Status: She is alert. Mental status is at baseline.  Psychiatric:        Mood and Affect: Mood normal.      ASSESSMENT/ PLAN:   Patient is being discharged with the following home health services:  Pt/ot/rn: to evaluate and treat as indicated for gait balance strength adl training medication management   Patient is being discharged with the following durable medical equipment:  None needed  Patient has been advised to f/u with their PCP in 1-2 weeks to bring them up to date on their rehab stay.  Social services at facility was responsible for arranging this appointment.  Pt was provided with a 30 day supply of prescriptions for medications and refills must be obtained from their PCP.  For controlled substances, a more limited supply may be provided adequate until PCP appointment only.   Her medications will be administered by receiving facility Her oxycodone was stopped; will start her on tylenol 650 mg three times daily  Ok Edwards NP [facility]  Contact [phone] Monday through Friday 8am- 5pm  After hours call [phone]

## 2018-05-04 ENCOUNTER — Emergency Department
Admission: EM | Admit: 2018-05-04 | Discharge: 2018-05-05 | Payer: Medicare Other | Attending: Emergency Medicine | Admitting: Emergency Medicine

## 2018-05-04 DIAGNOSIS — Z853 Personal history of malignant neoplasm of breast: Secondary | ICD-10-CM | POA: Diagnosis not present

## 2018-05-04 DIAGNOSIS — Z7902 Long term (current) use of antithrombotics/antiplatelets: Secondary | ICD-10-CM | POA: Insufficient documentation

## 2018-05-04 DIAGNOSIS — Z79899 Other long term (current) drug therapy: Secondary | ICD-10-CM | POA: Diagnosis not present

## 2018-05-04 DIAGNOSIS — E039 Hypothyroidism, unspecified: Secondary | ICD-10-CM | POA: Insufficient documentation

## 2018-05-04 DIAGNOSIS — Z23 Encounter for immunization: Secondary | ICD-10-CM | POA: Diagnosis not present

## 2018-05-04 DIAGNOSIS — Z7982 Long term (current) use of aspirin: Secondary | ICD-10-CM | POA: Diagnosis not present

## 2018-05-04 DIAGNOSIS — Y999 Unspecified external cause status: Secondary | ICD-10-CM | POA: Insufficient documentation

## 2018-05-04 DIAGNOSIS — S01312A Laceration without foreign body of left ear, initial encounter: Secondary | ICD-10-CM | POA: Diagnosis not present

## 2018-05-04 DIAGNOSIS — I10 Essential (primary) hypertension: Secondary | ICD-10-CM | POA: Diagnosis not present

## 2018-05-04 DIAGNOSIS — Y9301 Activity, walking, marching and hiking: Secondary | ICD-10-CM | POA: Diagnosis not present

## 2018-05-04 DIAGNOSIS — W0110XA Fall on same level from slipping, tripping and stumbling with subsequent striking against unspecified object, initial encounter: Secondary | ICD-10-CM | POA: Insufficient documentation

## 2018-05-04 DIAGNOSIS — Y9210 Unspecified residential institution as the place of occurrence of the external cause: Secondary | ICD-10-CM | POA: Diagnosis not present

## 2018-05-04 DIAGNOSIS — Z96643 Presence of artificial hip joint, bilateral: Secondary | ICD-10-CM | POA: Insufficient documentation

## 2018-05-04 DIAGNOSIS — W19XXXA Unspecified fall, initial encounter: Secondary | ICD-10-CM

## 2018-05-04 DIAGNOSIS — S0990XA Unspecified injury of head, initial encounter: Secondary | ICD-10-CM | POA: Diagnosis present

## 2018-05-04 NOTE — ED Provider Notes (Signed)
[facility] Emergency Department Provider Note   ____________________________________________   First MD Initiated Contact with Patient 05/04/18 2355     (approximate)  I have reviewed the triage vital signs and the nursing notes.   HISTORY  Chief Complaint Fall and Head Laceration    HPI Destiny Neal is a 85 y.o. female brought to the ED ED via EMS status post mechanical fall with ear laceration.  Patient stumbled and fell, striking her left ear on the nightstand.  Denies striking head or LOC.  She is on Plavix and aspirin.  Initially fire department had a difficult time controlling the bleeding.  EMS applied a nasal clamp over gauze which provided steady pressure.  At the time of patient's arrival to the emergency department, her left ear has stopped bleeding.   Other than soreness to her left ear, patient denies headache, vision changes, neck pain, chest pain, shortness of breath, abdominal pain, nausea, vomiting or lightheadedness.   Past Medical History:  Diagnosis Date  . Arthritis    hips  . Breast cancer ([place])   . Depression   . Hypertension   . Hypothyroidism   . Migraines    none in 8-10 yrs  . Wears hearing aid    bilateral    Patient Active Problem List   Diagnosis Date Noted  . Essential hypertension, benign 03/07/2018  . Cerebrovascular accident (CVA) due to occlusion of left posterior cerebral artery ([place]) 03/07/2018  . Fracture of femoral neck, right ([place]) 03/07/2018  . Pernicious anemia 03/07/2018  . Acquired hypothyroidism 03/07/2018  . Dyslipidemia 03/07/2018  . Depression, major, single episode, mild ([place]) 03/07/2018  . Peripheral neuropathy 03/07/2018  . Glaucoma 03/07/2018  . Hip fracture ([place]) 03/01/2018    Past Surgical History:  Procedure Laterality Date  . BREAST LUMPECTOMY    . CATARACT EXTRACTION W/PHACO Left 06/21/2015   Procedure: CATARACT EXTRACTION PHACO AND INTRAOCULAR LENS PLACEMENT (IOC);  Surgeon:  Ronnell Freshwater, MD;  Location: [place];  Service: Ophthalmology;  Laterality: Left;  . CATARACT EXTRACTION W/PHACO Right 07/12/2015   Procedure: CATARACT EXTRACTION PHACO AND INTRAOCULAR LENS PLACEMENT ([place]) right;  Surgeon: Ronnell Freshwater, MD;  Location: [place];  Service: Ophthalmology;  Laterality: Right;  . HIP ARTHROPLASTY Right 03/01/2018   Procedure: ARTHROPLASTY BIPOLAR HIP (HEMIARTHROPLASTY);  Surgeon: Corky Mull, MD;  Location: [facility];  Service: Orthopedics;  Laterality: Right;  . JOINT REPLACEMENT Left    total hip    Prior to Admission medications   Medication Sig Start Date End Date Taking? Authorizing Provider  acetaminophen (TYLENOL) 325 MG tablet Take 500 mg by mouth every 8 (eight) hours as needed. for mild to moderate pain or fever >100.5 03/19/18  Yes [provider]  amLODipine (NORVASC) 5 MG tablet Take 1 tablet (5 mg total) by mouth daily. 07/05/16  Yes Epifanio Lesches, MD  aspirin 81 MG tablet Take 81 mg by mouth daily.   Yes [provider]  clopidogrel (PLAVIX) 75 MG tablet Take 1 tablet (75 mg total) by mouth daily. 07/05/16  Yes Epifanio Lesches, MD  cyanocobalamin (,VITAMIN B-12,) 1000 MCG/ML injection Inject 1 mL into the muscle every 30 (thirty) days. 02/23/18  Yes [provider]  dorzolamide-timolol (COSOPT) 22.3-6.8 MG/ML ophthalmic solution Place 1 drop into the left eye 2 (two) times daily.    Yes [provider]  gabapentin (NEURONTIN) 100 MG capsule Take 100 mg by mouth 2 (two) times daily as needed.  02/25/18  Yes [provider]  hydrALAZINE (APRESOLINE) 25 MG tablet Take 25 mg by mouth every 12 (twelve) hours. 03/05/18  Yes [provider]  latanoprost (XALATAN) 0.005 % ophthalmic solution Place 1 drop into both eyes at bedtime. glaucoma- wait 5 minutes between multiple drops   Yes [provider]  Multiple Vitamin (MULTIVITAMIN) capsule Take 1  capsule by mouth daily.   Yes [provider]  PARoxetine (PAXIL) 10 MG tablet Take 5 mg by mouth daily.   Yes [provider]  rosuvastatin (CRESTOR) 5 MG tablet Take 1 tablet (5 mg total) by mouth daily at 6 PM. 07/05/16  Yes Epifanio Lesches, MD  SALINE NASAL SPRAY NA Place 2 sprays into both nostrils as needed. 03/07/18  Yes [provider]  NON FORMULARY Diet Type: Regular    [provider]    Allergies Lincocin [lincomycin hcl]; Chlorzoxazone; Codeine; Doxycycline; Hydrochlorothiazide; Hydrocodone; Metronidazole; Oxycodone; Penicillins; Tramadol; Betadine [povidone iodine]; and Iodine  History reviewed. No pertinent family history.  Social History Social History   Tobacco Use  . Smoking status: Never Smoker  . Smokeless tobacco: Never Used  Substance Use Topics  . Alcohol use: No  . Drug use: Never    Review of Systems  Constitutional: No fever/chills Eyes: No visual changes. ENT: Positive for left ear laceration.  No sore throat. Cardiovascular: Denies chest pain. Respiratory: Denies shortness of breath. Gastrointestinal: No abdominal pain.  No nausea, no vomiting.  No diarrhea.  No constipation. Genitourinary: Negative for dysuria. Musculoskeletal: Negative for back pain. Skin: Negative for rash. Neurological: Negative for headaches, focal weakness or numbness.   ____________________________________________   PHYSICAL EXAM:  VITAL SIGNS: ED Triage Vitals  Enc Vitals Group     BP      Pulse      Resp      Temp      Temp src      SpO2      Weight      Height      Head Circumference      Peak Flow      Pain Score      Pain Loc      Pain Edu?      Excl. in [place]?     Constitutional: Alert and oriented. Well appearing and in no acute distress. Eyes: Conjunctivae are normal. PERRL. EOMI. Head: Atraumatic. Ears: 0.5 cm linear laceration to upper, lateral helix of left ear without active bleeding.  Small area of  surrounding contusion.  No hematoma.  No hemotympanum. Nose: Atraumatic. Mouth/Throat: Mucous membranes are moist.  No dental malocclusion. Neck: No stridor.  No cervical spine tenderness to palpation. Cardiovascular: Normal rate, regular rhythm. Grossly normal heart sounds.  Good peripheral circulation. Respiratory: Normal respiratory effort.  No retractions. Lungs CTAB. Gastrointestinal: Soft and nontender. No distention. No abdominal bruits. No CVA tenderness. Musculoskeletal: No lower extremity tenderness nor edema.  No joint effusions. Neurologic: Alert and oriented x3.  CN II-XII grossly intact.  Normal speech and language. No gross focal neurologic deficits are appreciated.  Skin:  Skin is warm, dry and intact. No rash noted. Psychiatric: Mood and affect are normal. Speech and behavior are normal.  ____________________________________________   LABS (all labs ordered are listed, but only abnormal results are displayed)  Labs Reviewed - No data to display ____________________________________________  EKG  None ____________________________________________  RADIOLOGY  ED MD interpretation: Unremarkable lumbar spine; no ICH  Official radiology report(s): Dg Lumbar Spine Complete  Result Date:  05/05/2018 CLINICAL DATA:  Slip and fall injury. EXAM: LUMBAR SPINE - COMPLETE 4+ VIEW COMPARISON:  MRI lumbar spine 10/11/2010 FINDINGS: Lumbar scoliosis convex towards the left. Diffuse degenerative changes with narrowed interspaces and endplate hypertrophic changes throughout. Slight anterior subluxation of L4 on L5, nonspecific but likely degenerative. Degenerative changes in the facet joints. No focal bone lesion or bone destruction. No vertebral compression deformities. Visualized sacrum appears intact. Vascular calcifications in the aorta. Previous bilateral hip arthroplasties, incompletely evaluated. IMPRESSION: Degenerative changes in the lumbar spine with scoliosis convex towards the  left. No acute displaced fractures identified. Electronically Signed   By: Lucienne Capers M.D.   On: 05/05/2018 01:29   Ct Head Wo Contrast  Result Date: 05/05/2018 CLINICAL DATA:  Patient slipped and fell sustaining a small laceration to left upper pinna. EXAM: CT HEAD WITHOUT CONTRAST TECHNIQUE: Contiguous axial images were obtained from the base of the skull through the vertex without intravenous contrast. COMPARISON:  07/03/2016 MRI and 07/02/2016 CT FINDINGS: Brain: Chronic microvascular ischemic disease of periventricular and subcortical white matter and left anterior basal ganglia. Chronic inferior left cerebellar hemispheric infarct. No acute intracranial hemorrhage, large vascular territory infarct, midline shift or edema. Sulcal and ventricular prominence is redemonstrated consistent with age related atrophy. Vascular: Atherosclerosis of the carotid siphons. Skull: Intact Sinuses/Orbits: Bilateral cataract extractions. Clear paranasal sinuses and mastoids. Other: None IMPRESSION: 1. No acute intracranial abnormality. 2. Atrophy with chronic microvascular ischemic disease. 3. Chronic left cerebellar and left basal ganglial infarcts. Electronically Signed   By: Ashley Royalty M.D.   On: 05/05/2018 02:43    ____________________________________________   PROCEDURES  Procedure(s) performed:     Marland KitchenMarland KitchenLaceration Repair Date/Time: 05/05/2018 12:07 AM Performed by: Paulette Blanch, MD Authorized by: Paulette Blanch, MD   Consent:    Consent obtained:  Verbal   Consent given by:  Patient   Risks discussed:  Infection, pain, poor cosmetic result and poor wound healing Anesthesia (see MAR for exact dosages):    Anesthesia method:  None Laceration details:    Location:  Ear   Ear location:  L ear   Length (cm):  0.5 Repair type:    Repair type:  Simple Exploration:    Hemostasis achieved with:  Direct pressure   Wound exploration: entire depth of wound probed and visualized     Contaminated: no     Treatment:    Visualized foreign bodies/material removed: no   Skin repair:    Repair method:  Tissue adhesive Post-procedure details:    Dressing:  Sterile dressing   Patient tolerance of procedure:  Tolerated well, no immediate complications    Critical Care performed: No  ____________________________________________   INITIAL IMPRESSION / ASSESSMENT AND PLAN / ED COURSE  As part of my medical decision making, I reviewed the following data within the electronic MEDICAL RECORD NUMBER History obtained from family, Nursing notes reviewed and incorporated and Notes from prior ED visits    85 year old female who presents status post mechanical fall with left ear laceration, bleeding is well controlled.  Dermabond applied.  Strict return precautions given, especially regarding hematoma.  Patient and daughters verbalized understanding and agree with plan of care.  Clinical Course as of May 06 251  Nancy Fetter May 05, 2018  0040 Tetanus is not up-to-date; will update.  Patient now complains of 2/10 lower back pain.  Will image.   [JS]  0158 Updated patient and her family on unremarkable back x-rays.  Nurse had informed me that patient  was complaining of left-sided headache which she denies currently.  Discussed with patient and family and will obtain CT head to evaluate for intracranial hemorrhage.  Reevaluated left ear which is nonbleeding.  No hematoma.   [JS]  [id number] Updated patient and daughters of negative CT head.  Strict return precautions given.  Patient and family members verbalized understanding and agree with plan of care.   [JS]    Clinical Course User Index [JS] Paulette Blanch, MD     ____________________________________________   FINAL CLINICAL IMPRESSION(S) / ED DIAGNOSES  Final diagnoses:  Fall, initial encounter  Ear lobe laceration, left, initial encounter     ED Discharge Orders    None       Note:  This document was prepared using Dragon voice recognition software  and may include unintentional dictation errors.    Paulette Blanch, MD 05/05/18 [phone]

## 2018-05-04 NOTE — ED Triage Notes (Signed)
Pt fell, struck L ear on table during fall. [name] bleeding poorly controlled. Pt takes plavix and ASA. Pt uses a walker to ambulate. Denies LoC.

## 2018-05-05 ENCOUNTER — Emergency Department: Payer: Medicare Other

## 2018-05-05 ENCOUNTER — Encounter: Payer: Self-pay | Admitting: Emergency Medicine

## 2018-05-05 ENCOUNTER — Other Ambulatory Visit: Payer: Self-pay

## 2018-05-05 MED ORDER — TETANUS-DIPHTH-ACELL PERTUSSIS 5-2.5-18.5 LF-MCG/0.5 IM SUSP
0.5000 mL | Freq: Once | INTRAMUSCULAR | Status: AC
  Administered 2018-05-05: 0.5 mL via INTRAMUSCULAR
  Filled 2018-05-05: qty 0.5

## 2018-05-05 NOTE — Discharge Instructions (Addendum)
Return to the ER for worsening symptoms, swelling, bleeding, persistent vomiting, lethargy or other concerns.

## 2018-05-05 NOTE — ED Notes (Signed)
Pt returning from CT

## 2018-05-05 NOTE — ED Notes (Signed)
Report called to [place] at [facility]; verbalized understanding; copy of chart sent with family to give to [facility] staff

## 2018-05-05 NOTE — ED Notes (Addendum)
In to check on pt; voiced need to use the toilet; stretcher moved near toilet in room and pt able to stand up and get to toilet unassisted; stayed beside her for safety; pt voided and was able to get back onto stretcher without incident; pt noted to have large purplish discolored area to lumbar region; smaller quarter sized area of purplish bruising just above that;

## 2018-05-05 NOTE — ED Notes (Signed)
Pt says she slipped and fell going to the closet to get her clothes ready for church tomorrow; small laceration to left upper pinna; bleeding is now controlled; MD to dermabond area; pt only reports discomfort to lower back following ear;

## 2018-05-05 NOTE — ED Notes (Signed)
Pt from [facility]; 2 daughters at bedside say they can transport pt back when she is discharged

## 2018-05-05 NOTE — ED Notes (Signed)
Pt to xray via stretcher

## 2018-05-05 NOTE — ED Notes (Signed)
Dr Beather Arbour notified of pt's c/o lower back pain/discomfort since fall;

## 2018-05-05 NOTE — ED Notes (Addendum)
In to give pt TDap; pt c/o pain now to the left side of her head where she hit it on the table; family concerned as pt is on Plavix; will speak with Dr Beather Arbour and discuss next plan of care; pt says pain is intermittent and rates 3-4/10-not throbbing

## 2019-02-23 ENCOUNTER — Emergency Department: Payer: Medicare Other

## 2019-02-23 ENCOUNTER — Emergency Department
Admission: EM | Admit: 2019-02-23 | Discharge: 2019-02-23 | Disposition: A | Discharge status: Nursing Home (Includes ICF) | Payer: Medicare Other | Attending: Student | Admitting: Student

## 2019-02-23 ENCOUNTER — Encounter: Payer: Self-pay | Admitting: Emergency Medicine

## 2019-02-23 ENCOUNTER — Other Ambulatory Visit: Payer: Self-pay

## 2019-02-23 DIAGNOSIS — R29810 Facial weakness: Secondary | ICD-10-CM | POA: Diagnosis not present

## 2019-02-23 DIAGNOSIS — Z7902 Long term (current) use of antithrombotics/antiplatelets: Secondary | ICD-10-CM | POA: Diagnosis not present

## 2019-02-23 DIAGNOSIS — Z853 Personal history of malignant neoplasm of breast: Secondary | ICD-10-CM | POA: Diagnosis not present

## 2019-02-23 DIAGNOSIS — Z79899 Other long term (current) drug therapy: Secondary | ICD-10-CM | POA: Insufficient documentation

## 2019-02-23 DIAGNOSIS — Z7982 Long term (current) use of aspirin: Secondary | ICD-10-CM | POA: Insufficient documentation

## 2019-02-23 DIAGNOSIS — R519 Headache, unspecified: Secondary | ICD-10-CM | POA: Diagnosis not present

## 2019-02-23 DIAGNOSIS — E039 Hypothyroidism, unspecified: Secondary | ICD-10-CM | POA: Insufficient documentation

## 2019-02-23 DIAGNOSIS — I10 Essential (primary) hypertension: Secondary | ICD-10-CM | POA: Insufficient documentation

## 2019-02-23 LAB — CBC WITH DIFFERENTIAL/PLATELET
Abs Immature Granulocytes: 0.02 10*3/uL (ref 0.00–0.07)
Basophils Absolute: 0 10*3/uL (ref 0.0–0.1)
Basophils Relative: 1 %
Eosinophils Absolute: 0.2 10*3/uL (ref 0.0–0.5)
Eosinophils Relative: 3 %
HCT: 44 % (ref 36.0–46.0)
Hemoglobin: 14.8 g/dL (ref 12.0–15.0)
Immature Granulocytes: 0 %
Lymphocytes Relative: 32 %
Lymphs Abs: 2.1 10*3/uL (ref 0.7–4.0)
MCH: 31.8 pg (ref 26.0–34.0)
MCHC: 33.6 g/dL (ref 30.0–36.0)
MCV: 94.4 fL (ref 80.0–100.0)
Monocytes Absolute: 0.6 10*3/uL (ref 0.1–1.0)
Monocytes Relative: 9 %
Neutro Abs: 3.7 10*3/uL (ref 1.7–7.7)
Neutrophils Relative %: 55 %
Platelets: 216 10*3/uL (ref 150–400)
RBC: 4.66 MIL/uL (ref 3.87–5.11)
RDW: 11.9 % (ref 11.5–15.5)
WBC: 6.7 10*3/uL (ref 4.0–10.5)
nRBC: 0 % (ref 0.0–0.2)

## 2019-02-23 LAB — BASIC METABOLIC PANEL
Anion gap: 10 (ref 5–15)
BUN: 16 mg/dL (ref 8–23)
CO2: 26 mmol/L (ref 22–32)
Calcium: 9.7 mg/dL (ref 8.9–10.3)
Chloride: 103 mmol/L (ref 98–111)
Creatinine, Ser: 0.81 mg/dL (ref 0.44–1.00)
GFR calc Af Amer: >60 mL/min (ref >60)
GFR calc non Af Amer: >60 mL/min (ref >60)
Glucose, Bld: 93 mg/dL (ref 70–99)
Potassium: 3.8 mmol/L (ref 3.5–5.1)
Sodium: 139 mmol/L (ref 135–145)

## 2019-02-23 LAB — URINALYSIS, COMPLETE (UACMP) WITH MICROSCOPIC
Bacteria, UA: NONE SEEN
Bilirubin Urine: NEGATIVE
Glucose, UA: NEGATIVE mg/dL
Hgb urine dipstick: NEGATIVE
Ketones, ur: NEGATIVE mg/dL
Leukocytes,Ua: NEGATIVE
Nitrite: NEGATIVE
Protein, ur: NEGATIVE mg/dL
Specific Gravity, Urine: 1.004 — ABNORMAL LOW (ref 1.005–1.030)
Squamous Epithelial / HPF: NONE SEEN (ref 0–5)
pH: 7 (ref 5.0–8.0)

## 2019-02-23 LAB — TROPONIN I (HIGH SENSITIVITY): Troponin I (High Sensitivity): 7 ng/L (ref <18)

## 2019-02-23 MED ORDER — ACETAMINOPHEN 500 MG PO TABS
1000.0000 mg | ORAL_TABLET | Freq: Once | ORAL | Status: AC
  Administered 2019-02-23: 19:00:00 1000 mg via ORAL
  Filled 2019-02-23: qty 2

## 2019-02-23 MED ORDER — KETOROLAC TROMETHAMINE 30 MG/ML IJ SOLN
15.0000 mg | Freq: Once | INTRAMUSCULAR | Status: AC
  Administered 2019-02-23: 15 mg via INTRAVENOUS
  Filled 2019-02-23: qty 1

## 2019-02-23 MED ORDER — IBUPROFEN 600 MG PO TABS: 600.0000 mg | ORAL_TABLET | Freq: Three times a day (TID) | ORAL | 0 refills | Status: AC | PRN

## 2019-02-23 NOTE — ED Triage Notes (Signed)
Patient presents to the ED via EMS from [facility] for severe headache.  [place] reported "stroke symptoms" to EMS but were unable to clarify.  Patient is complaining of headache and is somewhat hypertensive with no history of hypertension.  Patient is alert with history of dementia.  No obvious slurred speech.  Per EMS patient was able to walk approx. 5 feet with a walker.  Patient appears to have slight left sided facial droop.  No obvious unilateral weakness at this time.

## 2019-02-23 NOTE — ED Provider Notes (Signed)
[facility] Emergency Department Provider Note  ____________________________________________   First MD Initiated Contact with Patient 02/23/19 1916     (approximate)  I have reviewed the triage vital signs and the nursing notes.  History  Chief Complaint No chief complaint on file.    HPI Destiny Neal is a 85 y.o. female who presents emergency department for headache.  Patient states over the last several weeks she has had more frequent headaches than normal.  She locates it to the right occiput area.  Her headaches seem to come and go without any identifiable inciting factors.  Sometimes last for a few hours up to a few days.  They typically resolve spontaneously and/or with the assistance of Tylenol.  She denies any associated speech difficulties, weakness, numbness, tingling with these.  Today,  she again complained of a headache.  The nursing home called EMS and apparently reported "strokelike symptoms" but did not clarify any further about what this meant.  There was some question as to whether or not the patient had a left-sided facial droop.  On arrival to the emergency department she is awake, alert, mentating at her baseline.  No slurred speech, facial droop, or lateralizing weakness.  She reports a mild headache, right occiput area, no radiation, no aggravating or alleviating factors.   Past Medical Hx Past Medical History:  Diagnosis Date  . Arthritis    hips  . Breast cancer ([place])   . Depression   . Hypertension   . Hypothyroidism   . Migraines    none in 8-10 yrs  . Wears hearing aid    bilateral    Problem List Patient Active Problem List   Diagnosis Date Noted  . Essential hypertension, benign 03/07/2018  . Cerebrovascular accident (CVA) due to occlusion of left posterior cerebral artery ([place]) 03/07/2018  . Fracture of femoral neck, right ([place]) 03/07/2018  . Pernicious anemia 03/07/2018  . Acquired hypothyroidism 03/07/2018   . Dyslipidemia 03/07/2018  . Depression, major, single episode, mild ([place]) 03/07/2018  . Peripheral neuropathy 03/07/2018  . Glaucoma 03/07/2018  . Hip fracture ([place]) 03/01/2018    Past Surgical Hx Past Surgical History:  Procedure Laterality Date  . BREAST LUMPECTOMY    . CATARACT EXTRACTION W/PHACO Left 06/21/2015   Procedure: CATARACT EXTRACTION PHACO AND INTRAOCULAR LENS PLACEMENT (IOC);  Surgeon: Ronnell Freshwater, MD;  Location: [facility];  Service: Ophthalmology;  Laterality: Left;  . CATARACT EXTRACTION W/PHACO Right 07/12/2015   Procedure: CATARACT EXTRACTION PHACO AND INTRAOCULAR LENS PLACEMENT ([place]) right;  Surgeon: Ronnell Freshwater, MD;  Location: [facility];  Service: Ophthalmology;  Laterality: Right;  . HIP ARTHROPLASTY Right 03/01/2018   Procedure: ARTHROPLASTY BIPOLAR HIP (HEMIARTHROPLASTY);  Surgeon: Corky Mull, MD;  Location: [facility];  Service: Orthopedics;  Laterality: Right;  . JOINT REPLACEMENT Left    total hip    Medications Prior to Admission medications   Medication Sig Start Date End Date Taking? Authorizing Provider  acetaminophen (TYLENOL) 325 MG tablet Take 500 mg by mouth every 8 (eight) hours as needed. for mild to moderate pain or fever >100.5 03/19/18   [provider]  amLODipine (NORVASC) 5 MG tablet Take 1 tablet (5 mg total) by mouth daily. 07/05/16   Epifanio Lesches, MD  aspirin 81 MG tablet Take 81 mg by mouth daily.    [provider]  clopidogrel (PLAVIX) 75 MG tablet Take 1 tablet (75 mg total) by mouth daily. 07/05/16   Vianne Bulls,  Snehalatha, MD  cyanocobalamin (,VITAMIN B-12,) 1000 MCG/ML injection Inject 1 mL into the muscle every 30 (thirty) days. 02/23/18   [provider]  dorzolamide-timolol (COSOPT) 22.3-6.8 MG/ML ophthalmic solution Place 1 drop into the left eye 2 (two) times daily.     [provider]  gabapentin (NEURONTIN) 100 MG capsule Take 100 mg by mouth 2  (two) times daily as needed.  02/25/18   [provider]  hydrALAZINE (APRESOLINE) 25 MG tablet Take 25 mg by mouth every 12 (twelve) hours. 03/05/18   [provider]  ibuprofen (ADVIL) 600 MG tablet Take 1 tablet (600 mg total) by mouth every 8 (eight) hours as needed for up to 5 days for mild pain or moderate pain. 02/23/19 02/28/19  Lilia Pro., MD  latanoprost (XALATAN) 0.005 % ophthalmic solution Place 1 drop into both eyes at bedtime. glaucoma- wait 5 minutes between multiple drops    [provider]  Multiple Vitamin (MULTIVITAMIN) capsule Take 1 capsule by mouth daily.    [provider]  NON FORMULARY Diet Type: Regular    [provider]  PARoxetine (PAXIL) 10 MG tablet Take 5 mg by mouth daily.    [provider]  rosuvastatin (CRESTOR) 5 MG tablet Take 1 tablet (5 mg total) by mouth daily at 6 PM. 07/05/16   Epifanio Lesches, MD  SALINE NASAL SPRAY NA Place 2 sprays into both nostrils as needed. 03/07/18   [provider]    Allergies Lincocin [lincomycin hcl], Chlorzoxazone, Codeine, Doxycycline, Hydrochlorothiazide, Hydrocodone, Metronidazole, Oxycodone, Penicillins, Tramadol, Betadine [povidone iodine], and Iodine  Family Hx No family history on file.  Social Hx Social History   Tobacco Use  . Smoking status: Never Smoker  . Smokeless tobacco: Never Used  Substance Use Topics  . Alcohol use: No  . Drug use: Never     Review of Systems  Constitutional: Negative for fever, chills. Eyes: Negative for visual changes. ENT: Negative for sore throat. Cardiovascular: Negative for chest pain. Respiratory: Negative for shortness of breath. Gastrointestinal: Negative for nausea, vomiting.  Genitourinary: Negative for dysuria. Musculoskeletal: Negative for leg swelling. Skin: Negative for rash. Neurological: +  for headaches.   Physical Exam  Vital Signs: ED Triage Vitals  Enc Vitals Group     BP  02/23/19 1903 (!) 153/81     Pulse Rate 02/23/19 1903 68     Resp 02/23/19 1903 16     Temp 02/23/19 1903 97.7 F (36.5 C)     Temp Source 02/23/19 1903 Oral     SpO2 02/23/19 1903 95 %     Weight 02/23/19 1912 176 lb (79.8 kg)     Height 02/23/19 1912 5\' 5"  (1.651 m)     Head Circumference --      Peak Flow --      Pain Score 02/23/19 1911 0     Pain Loc --      Pain Edu? --      Excl. in [place]? --     Constitutional: Alert and oriented.  Head: Normocephalic. Atraumatic.  No facial droop. Eyes: Conjunctivae clear. Sclera anicteric.  PERRL. Nose: No congestion. No rhinorrhea. Mouth/Throat: Wearing mask.  Neck: No stridor.  No meningismus.  FROM. Cardiovascular: Normal rate, regular rhythm. Extremities well perfused. Respiratory: Normal respiratory effort.  Lungs CTAB. Gastrointestinal: Soft. Non-tender. Non-distended.  Musculoskeletal: No lower extremity edema. No deformities. Neurologic:  Normal speech and language. No gross focal neurologic deficits are appreciated. Alert and oriented.  Face  symmetric.  Tongue midline.  Cranial nerves II through XII intact. UE and LE strength 5/5 and symmetric. UE and LE SILT. NIHSS 0. Skin: Skin is warm, dry and intact. No rash noted. Psychiatric: Mood and affect are appropriate for situation.  EKG  Personally reviewed.   Rate: 69 Rhythm: sinus Axis: LAD Intervals: WNL PAC No acute ischemic changes No STEMI    Radiology  CT: IMPRESSION:  Atrophy and small vessel disease. No acute intracranial findings.    Procedures  Procedure(s) performed (including critical care):  Procedures   Initial Impression / Assessment and Plan / ED Course  85 y.o. female who presents to the ED for headaches, as above.  Reportedly complained of headaches at her living facility, and there was some concern for "strokelike symptoms" however, on arrival to the emergency department she has no focal neurological deficits, no slurred speech, no facial  droop. NIHSS 0.  Ddx: low suspicion CVA/TIA, but will obtain CT to rule out.  Do not suspect SAH based on description, timeline.  No infectious symptoms or meningismus to suggest meningitis.  Consider tension headache, cluster headache, occipital headache.  CT negative, labs without actionable derangements.  Patient has received Tylenol and states she is asymptomatic at this time.  As such, given her negative work-up, feel she is stable for discharge with outpatient follow-up.  Will trial short course of ibuprofen to use as needed if headaches return.  Advise adequate hydration.  Patient and daughter at bedside voiced understanding and are comfortable to plan and discharge.   Final Clinical Impression(s) / ED Diagnosis  Final diagnoses:  Headache in back of head       Note:  This document was prepared using Dragon voice recognition software and may include unintentional dictation errors.   Lilia Pro., MD 02/23/19 [phone]

## 2019-02-23 NOTE — Discharge Instructions (Signed)
Thank you for letting us take care of you in the emergency department today.   Please continue to take any regular, prescribed medications.   New medications we have prescribed:  - Ibuprofen  Please follow up with: - Your primary care doctor to review your ER visit and follow up on your symptoms.   Please return to the ER for any new or worsening symptoms.

## 2019-09-02 ENCOUNTER — Emergency Department: Payer: Medicare PPO

## 2019-09-02 ENCOUNTER — Other Ambulatory Visit: Payer: Self-pay

## 2019-09-02 ENCOUNTER — Emergency Department
Admission: EM | Admit: 2019-09-02 | Discharge: 2019-09-04 | Disposition: A | Discharge status: Skilled Nursing Facility | Payer: Medicare PPO | Attending: Emergency Medicine | Admitting: Emergency Medicine

## 2019-09-02 DIAGNOSIS — S82832A Other fracture of upper and lower end of left fibula, initial encounter for closed fracture: Secondary | ICD-10-CM

## 2019-09-02 DIAGNOSIS — S99912A Unspecified injury of left ankle, initial encounter: Secondary | ICD-10-CM | POA: Insufficient documentation

## 2019-09-02 DIAGNOSIS — Z7902 Long term (current) use of antithrombotics/antiplatelets: Secondary | ICD-10-CM | POA: Diagnosis not present

## 2019-09-02 DIAGNOSIS — W19XXXA Unspecified fall, initial encounter: Secondary | ICD-10-CM

## 2019-09-02 DIAGNOSIS — Z79899 Other long term (current) drug therapy: Secondary | ICD-10-CM | POA: Insufficient documentation

## 2019-09-02 DIAGNOSIS — W010XXA Fall on same level from slipping, tripping and stumbling without subsequent striking against object, initial encounter: Secondary | ICD-10-CM | POA: Diagnosis not present

## 2019-09-02 DIAGNOSIS — S52691A Other fracture of lower end of right ulna, initial encounter for closed fracture: Secondary | ICD-10-CM | POA: Insufficient documentation

## 2019-09-02 DIAGNOSIS — M25552 Pain in left hip: Secondary | ICD-10-CM | POA: Insufficient documentation

## 2019-09-02 DIAGNOSIS — M545 Low back pain: Secondary | ICD-10-CM | POA: Diagnosis not present

## 2019-09-02 DIAGNOSIS — Y999 Unspecified external cause status: Secondary | ICD-10-CM | POA: Diagnosis not present

## 2019-09-02 DIAGNOSIS — S52601A Unspecified fracture of lower end of right ulna, initial encounter for closed fracture: Secondary | ICD-10-CM

## 2019-09-02 DIAGNOSIS — Z7982 Long term (current) use of aspirin: Secondary | ICD-10-CM | POA: Insufficient documentation

## 2019-09-02 DIAGNOSIS — S6991XA Unspecified injury of right wrist, hand and finger(s), initial encounter: Secondary | ICD-10-CM | POA: Diagnosis present

## 2019-09-02 DIAGNOSIS — M25532 Pain in left wrist: Secondary | ICD-10-CM | POA: Insufficient documentation

## 2019-09-02 DIAGNOSIS — Y92199 Unspecified place in other specified residential institution as the place of occurrence of the external cause: Secondary | ICD-10-CM | POA: Diagnosis not present

## 2019-09-02 DIAGNOSIS — R1030 Lower abdominal pain, unspecified: Secondary | ICD-10-CM | POA: Diagnosis not present

## 2019-09-02 DIAGNOSIS — Z8673 Personal history of transient ischemic attack (TIA), and cerebral infarction without residual deficits: Secondary | ICD-10-CM | POA: Insufficient documentation

## 2019-09-02 DIAGNOSIS — S8265XA Nondisplaced fracture of lateral malleolus of left fibula, initial encounter for closed fracture: Secondary | ICD-10-CM | POA: Diagnosis not present

## 2019-09-02 DIAGNOSIS — I10 Essential (primary) hypertension: Secondary | ICD-10-CM | POA: Diagnosis not present

## 2019-09-02 DIAGNOSIS — R339 Retention of urine, unspecified: Secondary | ICD-10-CM | POA: Insufficient documentation

## 2019-09-02 DIAGNOSIS — M25562 Pain in left knee: Secondary | ICD-10-CM | POA: Insufficient documentation

## 2019-09-02 DIAGNOSIS — Y9389 Activity, other specified: Secondary | ICD-10-CM | POA: Insufficient documentation

## 2019-09-02 DIAGNOSIS — Z20822 Contact with and (suspected) exposure to covid-19: Secondary | ICD-10-CM | POA: Diagnosis not present

## 2019-09-02 LAB — CBC WITH DIFFERENTIAL/PLATELET
Abs Immature Granulocytes: 0.03 10*3/uL (ref 0.00–0.07)
Basophils Absolute: 0 10*3/uL (ref 0.0–0.1)
Basophils Relative: 0 %
Eosinophils Absolute: 0 10*3/uL (ref 0.0–0.5)
Eosinophils Relative: 0 %
HCT: 43.6 % (ref 36.0–46.0)
Hemoglobin: 14.7 g/dL (ref 12.0–15.0)
Immature Granulocytes: 0 %
Lymphocytes Relative: 12 %
Lymphs Abs: 1.3 10*3/uL (ref 0.7–4.0)
MCH: 32.5 pg (ref 26.0–34.0)
MCHC: 33.7 g/dL (ref 30.0–36.0)
MCV: 96.2 fL (ref 80.0–100.0)
Monocytes Absolute: 0.5 10*3/uL (ref 0.1–1.0)
Monocytes Relative: 5 %
Neutro Abs: 9 10*3/uL — ABNORMAL HIGH (ref 1.7–7.7)
Neutrophils Relative %: 83 %
Platelets: 209 10*3/uL (ref 150–400)
RBC: 4.53 MIL/uL (ref 3.87–5.11)
RDW: 12.2 % (ref 11.5–15.5)
WBC: 10.9 10*3/uL — ABNORMAL HIGH (ref 4.0–10.5)
nRBC: 0 % (ref 0.0–0.2)

## 2019-09-02 LAB — URINALYSIS, ROUTINE W REFLEX MICROSCOPIC
Bilirubin Urine: NEGATIVE
Glucose, UA: NEGATIVE mg/dL
Hgb urine dipstick: NEGATIVE
Ketones, ur: NEGATIVE mg/dL
Leukocytes,Ua: NEGATIVE
Nitrite: NEGATIVE
Protein, ur: NEGATIVE mg/dL
Specific Gravity, Urine: 1.008 (ref 1.005–1.030)
pH: 7 (ref 5.0–8.0)

## 2019-09-02 LAB — BASIC METABOLIC PANEL
Anion gap: 11 (ref 5–15)
BUN: 13 mg/dL (ref 8–23)
CO2: 27 mmol/L (ref 22–32)
Calcium: 9.5 mg/dL (ref 8.9–10.3)
Chloride: 102 mmol/L (ref 98–111)
Creatinine, Ser: 0.77 mg/dL (ref 0.44–1.00)
GFR calc Af Amer: >60 mL/min (ref >60)
GFR calc non Af Amer: >60 mL/min (ref >60)
Glucose, Bld: 118 mg/dL — ABNORMAL HIGH (ref 70–99)
Potassium: 3.7 mmol/L (ref 3.5–5.1)
Sodium: 140 mmol/L (ref 135–145)

## 2019-09-02 LAB — SARS CORONAVIRUS 2 BY RT PCR (HOSPITAL ORDER, PERFORMED IN ~~LOC~~ HOSPITAL LAB): SARS Coronavirus 2: NEGATIVE

## 2019-09-02 MED ORDER — LATANOPROST 0.005 % OP SOLN
1.0000 [drp] | Freq: Every day | OPHTHALMIC | Status: DC
  Administered 2019-09-03: 1 [drp] via OPHTHALMIC
  Filled 2019-09-02: qty 2.5

## 2019-09-02 MED ORDER — DORZOLAMIDE HCL-TIMOLOL MAL 2-0.5 % OP SOLN
1.0000 [drp] | Freq: Two times a day (BID) | OPHTHALMIC | Status: DC
  Administered 2019-09-03 – 2019-09-04 (×3): 1 [drp] via OPHTHALMIC
  Filled 2019-09-02: qty 10

## 2019-09-02 MED ORDER — HYDRALAZINE HCL 50 MG PO TABS
25.0000 mg | ORAL_TABLET | Freq: Two times a day (BID) | ORAL | Status: DC
  Administered 2019-09-03 – 2019-09-04 (×3): 25 mg via ORAL
  Filled 2019-09-02 (×3): qty 1

## 2019-09-02 MED ORDER — GABAPENTIN 100 MG PO CAPS
100.0000 mg | ORAL_CAPSULE | Freq: Two times a day (BID) | ORAL | Status: DC | PRN
  Filled 2019-09-02: qty 1

## 2019-09-02 MED ORDER — ONDANSETRON HCL 4 MG/2ML IJ SOLN: 4.0000 mg | Freq: Once | INTRAMUSCULAR | Status: DC

## 2019-09-02 MED ORDER — ADULT MULTIVITAMIN W/MINERALS CH
1.0000 | ORAL_TABLET | Freq: Every day | ORAL | Status: DC
  Administered 2019-09-03 – 2019-09-04 (×2): 1 via ORAL
  Filled 2019-09-02: qty 1

## 2019-09-02 MED ORDER — MELATONIN 5 MG PO TABS
5.0000 mg | ORAL_TABLET | Freq: Every day | ORAL | Status: DC
  Filled 2019-09-02 (×3): qty 1

## 2019-09-02 MED ORDER — ACETAMINOPHEN 500 MG PO TABS
1000.0000 mg | ORAL_TABLET | Freq: Once | ORAL | Status: AC
  Administered 2019-09-02: 1000 mg via ORAL
  Filled 2019-09-02: qty 2

## 2019-09-02 MED ORDER — AMLODIPINE BESYLATE 5 MG PO TABS
5.0000 mg | ORAL_TABLET | Freq: Every day | ORAL | Status: DC
  Administered 2019-09-03 – 2019-09-04 (×2): 5 mg via ORAL
  Filled 2019-09-02 (×2): qty 1

## 2019-09-02 MED ORDER — ACETAMINOPHEN 325 MG PO TABS
650.0000 mg | ORAL_TABLET | Freq: Once | ORAL | Status: AC
  Administered 2019-09-02: 650 mg via ORAL
  Filled 2019-09-02: qty 2

## 2019-09-02 MED ORDER — CLOPIDOGREL BISULFATE 75 MG PO TABS
75.0000 mg | ORAL_TABLET | Freq: Every day | ORAL | Status: DC
  Administered 2019-09-03 – 2019-09-04 (×2): 75 mg via ORAL
  Filled 2019-09-02 (×2): qty 1

## 2019-09-02 MED ORDER — PAROXETINE HCL 20 MG PO TABS
10.0000 mg | ORAL_TABLET | Freq: Every day | ORAL | Status: DC
  Administered 2019-09-03 – 2019-09-04 (×2): 10 mg via ORAL
  Filled 2019-09-02: qty 1
  Filled 2019-09-02: qty 0.5

## 2019-09-02 MED ORDER — VITAMIN B-12 1000 MCG PO TABS
1000.0000 ug | ORAL_TABLET | Freq: Every day | ORAL | Status: DC
  Administered 2019-09-03 – 2019-09-04 (×2): 1000 ug via ORAL
  Filled 2019-09-02 (×3): qty 1

## 2019-09-02 MED ORDER — DOCUSATE SODIUM 100 MG PO CAPS
100.0000 mg | ORAL_CAPSULE | Freq: Two times a day (BID) | ORAL | Status: DC
  Administered 2019-09-03 – 2019-09-04 (×3): 100 mg via ORAL
  Filled 2019-09-02 (×2): qty 1

## 2019-09-02 MED ORDER — VITAMIN D 25 MCG (1000 UNIT) PO TABS
4000.0000 [IU] | ORAL_TABLET | Freq: Every day | ORAL | Status: DC
  Administered 2019-09-04: 4000 [IU] via ORAL
  Filled 2019-09-02: qty 4

## 2019-09-02 MED ORDER — TRAZODONE HCL 50 MG PO TABS
50.0000 mg | ORAL_TABLET | Freq: Every day | ORAL | Status: DC
  Administered 2019-09-03: 50 mg via ORAL
  Filled 2019-09-02: qty 1

## 2019-09-02 MED ORDER — ASPIRIN EC 81 MG PO TBEC
81.0000 mg | DELAYED_RELEASE_TABLET | Freq: Every day | ORAL | Status: DC
  Administered 2019-09-03 – 2019-09-04 (×2): 81 mg via ORAL
  Filled 2019-09-02 (×3): qty 1

## 2019-09-02 MED ORDER — ROSUVASTATIN CALCIUM 5 MG PO TABS
5.0000 mg | ORAL_TABLET | Freq: Every day | ORAL | Status: DC
  Filled 2019-09-02 (×2): qty 1

## 2019-09-02 MED ORDER — ACETAMINOPHEN 500 MG PO TABS: 500.0000 mg | ORAL_TABLET | Freq: Three times a day (TID) | ORAL | Status: DC | PRN

## 2019-09-02 NOTE — Evaluation (Signed)
Physical Therapy Evaluation Patient Details Name: Destiny Neal MRN: [id number] DOB: [birth year] Today's Date: 09/02/2019   History of Present Illness  Destiny Neal is a 12yoF who comes to [facility] afer a fall, slipped in BR. Imaging reveals LLE fracture, RUE fracture, both splinted. Details include Left pubic body fracture (remote), Left ankle Nondisplaced fibular fracture at the level of the ankle mortise, nondisplaced oblique fracture noted of the Right distal ulna. ED MD gives LLE NWB, RLE FWB. RUE NWB is assumed at time of eval. All fractures are being managed conservatively. PMH: BrCA, HTN, hypoTSH, HOH, cerebellar infarct 2018, Lt THA. Pt fell in 2019 and sustained a Rt hip fracture, now s/p HHA. Hx of Rt humeral neck fracture 2015.  Clinical Impression  Pt admitted with above diagnosis. Pt currently with functional limitations due to the deficits listed below (see "PT Problem List"). Upon entry, pt in bed, awake and agreeable to participate. The pt is alert and oriented x4, pleasant, conversational, and generally a good historian. Pt educated on weight bearing restrictions, intermittent cues needed regarding RUE due to force of habit. MinA and heavy cues for supine to sitting. Mod-Max A for STS with 1-person assist (dependent style transfer). Pt not appropriate to trial of AMB due to high level of exertion required, significant falls history, and pain level. Pt quite motivated, calm and cooperative throughout, but does report significant pain increase with mobility. Functional mobility assessment demonstrates increased effort/time requirements, poor tolerance, and need for physical assistance, whereas the patient performed these at a higher level of independence PTA. Pt will benefit from skilled PT intervention to increase independence and safety with basic mobility in preparation for discharge to the venue listed below.       Follow Up Recommendations SNF;Supervision for mobility/OOB;Supervision  - Intermittent    Equipment Recommendations  Other (comment)(to be determined by facility; May benefit from platform RW if able to weightbear through elbow)    Recommendations for Other Services       Precautions / Restrictions Precautions Precautions: Fall Restrictions Weight Bearing Restrictions: Yes RUE Weight Bearing: Non weight bearing((assumed at eval)) RLE Weight Bearing: ("FWB") LLE Weight Bearing: Touchdown weight bearing Other Position/Activity Restrictions: both LLE and RUE in splint at entry      Mobility  Bed Mobility Overal bed mobility: Needs Assistance Bed Mobility: Supine to Sit;Sit to Supine     Supine to sit: Min assist;HOB elevated Sit to supine: Min assist;HOB elevated   General bed mobility comments: cues to remind patient to avoid RUE use; cues for step-by=-step instruction  Transfers Overall transfer level: Needs assistance Equipment used: 1 person hand held assist Transfers: Sit to/from Stand Sit to Stand: Mod assist         General transfer comment: maintained LLE TDWB, RLE strength fair to moderate; Pt holds  onto author with LUE; cues to avoid RUE use.  Ambulation/Gait Ambulation/Gait assistance: (unable to attempt at this time.)              Stairs            Wheelchair Mobility    Modified Rankin (Stroke Patients Only)       Balance Overall balance assessment: Needs assistance;History of Falls Sitting-balance support: Single extremity supported Sitting balance-Leahy Scale: Good     Standing balance support: Single extremity supported;During functional activity Standing balance-Leahy Scale: Poor  Pertinent Vitals/Pain Pain Assessment: Faces Faces Pain Scale: Hurts even more Pain Location: across lower back; ABD distention in the setting of inability to void. Pain Descriptors / Indicators: Aching Pain Intervention(s): Limited activity within patient's tolerance     Home Living Family/patient expects to be discharged to:: Assisted living([facility])     Type of Home: Assisted living         Home Equipment: Gilford Rile - 2 wheels;Walker - 4 wheels      Prior Function Level of Independence: Needs assistance   Gait / Transfers Assistance Needed: facility AMB with RW or 4WW  ADL's / Homemaking Assistance Needed: staff assist with bathing due to falls risk.        Hand Dominance        Extremity/Trunk Assessment                Communication   Communication: HOH  Cognition Arousal/Alertness: Awake/alert Behavior During Therapy: WFL for tasks assessed/performed Overall Cognitive Status: Within Functional Limits for tasks assessed                                        General Comments      Exercises     Assessment/Plan    PT Assessment Patient needs continued PT services  PT Problem List Decreased strength;Decreased activity tolerance;Decreased balance;Decreased mobility       PT Treatment Interventions DME instruction;Balance training;Functional mobility training;Therapeutic activities;Therapeutic exercise;Patient/family education    PT Goals (Current goals can be found in the Care Plan section)  Acute Rehab PT Goals Patient Stated Goal: stop falling, be independent with mobility. PT Goal Formulation: With patient Time For Goal Achievement: 09/16/19 Potential to Achieve Goals: Fair    Frequency 7X/week   Barriers to discharge        Co-evaluation               AM-PAC PT "6 Clicks" Mobility  Outcome Measure Help needed turning from your back to your side while in a flat bed without using bedrails?: A Lot Help needed moving from lying on your back to sitting on the side of a flat bed without using bedrails?: A Lot Help needed moving to and from a bed to a chair (including a wheelchair)?: A Lot Help needed standing up from a chair using your arms (e.g., wheelchair or bedside chair)?: A  Lot Help needed to walk in hospital room?: Total Help needed climbing 3-5 steps with a railing? : Total 6 Click Score: 10    End of Session   Activity Tolerance: Patient tolerated treatment well;No increased pain;Patient limited by pain Patient left: in bed;with call bell/phone within reach   PT Visit Diagnosis: Unsteadiness on feet (R26.81);Other abnormalities of gait and mobility (R26.89);History of falling (Z91.81)    Time: 8127-5170 PT Time Calculation (min) (ACUTE ONLY): 14 min   Charges:   PT Evaluation $PT Eval High Complexity: 1 High PT Treatments $Therapeutic Exercise: 8-22 mins        3:34 PM, 09/02/19 Etta Grandchild, PT, DPT Physical Therapist - [facility]  [phone] ([place])    [place] C 09/02/2019, 3:31 PM

## 2019-09-02 NOTE — TOC Initial Note (Signed)
Transition of Care [facility]: [place] Destiny Destiny Neal) - Initial/Assessment Note    Patient Details  Name: Destiny Destiny Neal MRN: [id number] Date of Birth: 10/20/1920  Transition of Care [facility]) CM/SW Contact:    Destiny Pancoast, RN Phone Number: 09/02/2019, 12:32 PM  Clinical Narrative:                 Spoke with patient and daughter, Destiny Destiny Neal at bedside regarding potential SNF placement for rehab. Patient requesting [facility] as first choice. Discussed need for PT eval and insurance authorization. Daughter, Destiny Destiny Neal is medical POA and states she is headed out of town and her brother Destiny Destiny Neal will be coming to sit with patient. Patient is very hard of hearing and wears bilateral hearing aides as well as glasses none of which are here with the patient. Patient had hip fracture 18 months ago and completed rehab at [facility]. Currently patient is complaining of urinary retention. MD is aware and assessing. Once PT eval completed RN CM will continue dc planning.   Expected Discharge Plan: [place] Barriers to Discharge: Continued Medical Work up   Patient Goals and CMS Choice Patient states their goals for this hospitalization and ongoing recovery are:: Get back to [email or web address].gov Compare Post Acute Care list provided to:: Patient Choice offered to / list presented to : Adult Children, Patient  Expected Discharge Plan and Services Expected Discharge Plan: [place] Choice: [place] arrangements for the past 2 months: Assisted Living Facility([facility])                                      Prior Living Arrangements/Services Living arrangements for the past 2 months: Assisted Living Facility([facility]) Lives with:: Facility Resident Patient language and need for interpreter reviewed:: Yes Do you feel safe going back to the place where you live?: No   Need therapy for rehab  Need for Family Participation in Patient Care: Yes  (Comment) Care giver support system in place?: Yes (comment) Current home services: DME(walker) Criminal Activity/Legal Involvement Pertinent to Current Situation/Hospitalization: No - Comment as needed  Activities of Daily Living      Permission Sought/Granted Permission sought to share information with : Case Manager, Customer service manager Permission granted to share information with : Yes, Verbal Permission Granted  Share Information with NAME: TOC Department-CM  Permission granted to share info Destiny Neal AGENCY: SNF        Emotional Assessment Appearance:: Appears younger than stated age Attitude/Demeanor/Rapport: Engaged Affect (typically observed): Accepting Orientation: : Oriented to Self, Oriented to Place, Oriented to  Time, Oriented to Situation Alcohol / Substance Use: Never Used Psych Involvement: No (comment)  Admission diagnosis:  Fall Patient Active Problem List   Diagnosis Date Noted  . Essential hypertension, benign 03/07/2018  . Cerebrovascular accident (CVA) due to occlusion of left posterior cerebral artery ([place]) 03/07/2018  . Fracture of femoral neck, right ([place]) 03/07/2018  . Pernicious anemia 03/07/2018  . Acquired hypothyroidism 03/07/2018  . Dyslipidemia 03/07/2018  . Depression, major, single episode, mild ([place]) 03/07/2018  . Peripheral neuropathy 03/07/2018  . Glaucoma 03/07/2018  . Hip fracture ([place]) 03/01/2018   PCP:  Destiny Destiny Neal, [facility]:   [facility] - [address] [place], [place] Destiny Destiny Neal [address] Phone: [phone] Fax: [phone]     Social Determinants of  Health (SDOH) Interventions    Readmission Risk Interventions No flowsheet data found.

## 2019-09-02 NOTE — ED Provider Notes (Signed)
Patient is complaining of lower abdominal pain.  Bladder seems full.  Bladder scan showed 0 L present.  Patient cannot urinate.  This appears to be new for her.  She has good strength in both legs which I just checked straight leg raise is good bilaterally no numbness in her legs.  No numbness in her privates.  She has good rectal tone.  She complains of minimal pain on palpation of her entire low back but no focal tenderness.   Nena Polio, MD 09/02/19 1143

## 2019-09-02 NOTE — ED Notes (Signed)
meds given.  Pt alert  Family with pt

## 2019-09-02 NOTE — ED Notes (Signed)
Patient turned to right side with pillows

## 2019-09-02 NOTE — ED Notes (Signed)
This RN and Rosalita Chessman attempted foley placement multiple times. Unable to place foley. MD Cinda Quest made aware

## 2019-09-02 NOTE — ED Triage Notes (Signed)
Pt arrives from [facility] via [facility] with complaint of left foot and leg pain and bilateral wrist pain.  She says she got up to go to the bathroom and fell.  She is alert and oriented x4.

## 2019-09-02 NOTE — ED Provider Notes (Addendum)
To experienced nurses have attempted Foley catheter.  They are unable to place it.  I have paged urology twice and will page them again.   Nena Polio, MD 09/02/19 1309 ----------------------------------------- 1:12 PM on 09/02/2019 -----------------------------------------  Dr. Bernardo Heater just called back.  We will try to get the catheter team to place the catheter and if they cannot Dr. Bernardo Heater will get one of the PAs to come in and do it because he is scrubbed in the operating room currently.   Nena Polio, MD 09/02/19 1313

## 2019-09-02 NOTE — ED Notes (Signed)
Pt resting quietly.

## 2019-09-02 NOTE — ED Notes (Signed)
Patient on bedpan at this time.

## 2019-09-02 NOTE — Consult Note (Signed)
Urology Consult  I have been asked to see the patient by Dr. Cinda Quest, for evaluation and management of urinary retention with difficult Foley placement.  Chief Complaint: Inability to urinate, lower abdominal distention  History of Present Illness: Destiny Neal is a 86 y.o. year old female who presented to the ED this morning after a fall at her residential facility. She was found to have sustained a nondisplaced distal left fibular fracture as well as a nondisplaced oblique fracture of the distal right ulna. She subsequently reported lower abdominal distention and difficulty urinating.  Bedside bladder scan revealed >1L of urine.  Nursing attempted Foley catheterization at the bedside but was unable to do so.  Patient is accompanied today by her daughter at the bedside, who contributes to HPI.  Creatinine 0.77 today, consistent with baseline. WBC count slightly elevated at 10.9. UA today pan negative.  Patient reports a 3-day history of dysuria, followed by a 2-day history of lower abdominal distention, followed by the inability to urinate since her fall around 3 AM this morning.  She denies a history of urinary retention. She has never seen a urologist in the past. She denies a history of urinary urgency, frequency, or incontinence. She denies a history of low back pain or spine problems. She does report a history of chronic constipation managed with Colace. Of note, she underwent UA and urine culture with [facility] last week. I was able to review her urine test results on her daughter's phone; urine culture negative.  Past Medical History:  Diagnosis Date  . Arthritis    hips  . Breast cancer ([place])   . Depression   . Hypertension   . Hypothyroidism   . Migraines    none in 8-10 yrs  . Wears hearing aid    bilateral    Past Surgical History:  Procedure Laterality Date  . BREAST LUMPECTOMY    . CATARACT EXTRACTION W/PHACO Left 06/21/2015   Procedure:  CATARACT EXTRACTION PHACO AND INTRAOCULAR LENS PLACEMENT (IOC);  Surgeon: Ronnell Freshwater, MD;  Location: [facility];  Service: Ophthalmology;  Laterality: Left;  . CATARACT EXTRACTION W/PHACO Right 07/12/2015   Procedure: CATARACT EXTRACTION PHACO AND INTRAOCULAR LENS PLACEMENT ([place]) right;  Surgeon: Ronnell Freshwater, MD;  Location: [facility];  Service: Ophthalmology;  Laterality: Right;  . HIP ARTHROPLASTY Right 03/01/2018   Procedure: ARTHROPLASTY BIPOLAR HIP (HEMIARTHROPLASTY);  Surgeon: Corky Mull, MD;  Location: [facility];  Service: Orthopedics;  Laterality: Right;  . JOINT REPLACEMENT Left    total hip    Home Medications:  Current Meds  Medication Sig  . acetaminophen (TYLENOL) 325 MG tablet Take 500 mg by mouth every 8 (eight) hours as needed. for mild to moderate pain or fever >100.5  . amLODipine (NORVASC) 5 MG tablet Take 1 tablet (5 mg total) by mouth daily.  Marland Kitchen aspirin 81 MG tablet Take 81 mg by mouth daily.  . Cholecalciferol (VITAMIN D3) 50 MCG (2000 UT) TABS Take 4,000 Units by mouth daily at 12 noon.  . clopidogrel (PLAVIX) 75 MG tablet Take 1 tablet (75 mg total) by mouth daily.  . cyanocobalamin (,VITAMIN B-12,) 1000 MCG/ML injection Inject 1 mL into the muscle every 30 (thirty) days.  Marland Kitchen docusate sodium (COLACE) 100 MG capsule Take 100 mg by mouth 2 (two) times daily.  . dorzolamide-timolol (COSOPT) 22.3-6.8 MG/ML ophthalmic solution Place 1 drop into the left eye 2 (two) times daily.   Marland Kitchen gabapentin (NEURONTIN)  100 MG capsule Take 100 mg by mouth 2 (two) times daily as needed.   . hydrALAZINE (APRESOLINE) 25 MG tablet Take 25 mg by mouth every 12 (twelve) hours.  Marland Kitchen latanoprost (XALATAN) 0.005 % ophthalmic solution Place 1 drop into both eyes at bedtime. glaucoma- wait 5 minutes between multiple drops  . melatonin 5 MG TABS Take 5 mg by mouth at bedtime.  . Multiple Vitamin (MULTIVITAMIN) capsule Take 1 capsule by mouth daily.  Marland Kitchen  PARoxetine (PAXIL) 10 MG tablet Take 10 mg by mouth daily.   . rosuvastatin (CRESTOR) 5 MG tablet Take 1 tablet (5 mg total) by mouth daily at 6 PM.  . traZODone (DESYREL) 50 MG tablet Take 50 mg by mouth at bedtime.  . vitamin B-12 (CYANOCOBALAMIN) 1000 MCG tablet Take 1,000 mcg by mouth daily.  . [DISCONTINUED] Cholecalciferol 25 MCG (1000 UT) tablet Take 4,000 Units by mouth daily at 12 noon.    Allergies:  Allergies  Allergen Reactions  . Lincocin [Lincomycin Hcl] Swelling    angioedema  . Metronidazole Itching    severe  . Codeine     Severe headache  . Chlorzoxazone Other (See Comments)    Unspecified  . Doxycycline   . Hydrochlorothiazide Other (See Comments)  . Hydrocodone Nausea Only and Other (See Comments)    fainted  . Oxycodone Other (See Comments)    Sleep walks  . Penicillins Swelling  . Tramadol Nausea And Vomiting  . Betadine [Povidone Iodine] Rash    Itching  . Iodine Rash    itching    History reviewed. No pertinent family history.  Social History:  reports that she has never smoked. She has never used smokeless tobacco. She reports that she does not drink alcohol or use drugs.  ROS: A complete review of systems was performed.  All systems are negative except for pertinent findings as noted.  Physical Exam:  Vital signs in last 24 hours: Temp:  [97.6 F (36.4 C)] 97.6 F (36.4 C) (06/01 0348) Pulse Rate:  [62-90] 82 (06/01 1630) Resp:  [16-20] 16 (06/01 1300) BP: (150-194)/(65-101) 177/79 (06/01 1630) SpO2:  [94 %-98 %] 95 % (06/01 1630) Weight:  [77.1 kg] 77.1 kg (06/01 0346) Constitutional:  Alert, no acute distress HEENT: [place] AT, moist mucus membranes Cardiovascular: No clubbing, cyanosis, or edema. Respiratory: Normal respiratory effort GU: Palpable bladder at the umbilicus Skin: No rashes, bruises or suspicious lesions Neurologic: Grossly intact, no focal deficits, moving all 4 extremities Psychiatric: Normal mood and affect  Laboratory  Data:  Recent Labs    09/02/19 0817  WBC 10.9*  HGB 14.7  HCT 43.6   Recent Labs    09/02/19 0817  NA 140  K 3.7  CL 102  CO2 27  GLUCOSE 118*  BUN 13  CREATININE 0.77  CALCIUM 9.5   Urinalysis    Component Value Date/Time   COLORURINE STRAW (A) 09/02/2019 0817   APPEARANCEUR CLEAR (A) 09/02/2019 0817   APPEARANCEUR Clear 05/27/2011 1243   LABSPEC 1.008 09/02/2019 0817   LABSPEC 1.006 05/27/2011 1243   PHURINE 7.0 09/02/2019 [place] 09/02/2019 0817   GLUCOSEU Negative 05/27/2011 1243   HGBUR NEGATIVE 09/02/2019 [place] 09/02/2019 0817   BILIRUBINUR Negative 05/27/2011 1243   [place] 09/02/2019 0817   PROTEINUR NEGATIVE 09/02/2019 0817   NITRITE NEGATIVE 09/02/2019 0817   LEUKOCYTESUR NEGATIVE 09/02/2019 0817   LEUKOCYTESUR Negative 05/27/2011 1243   Results for orders placed or performed during  the hospital encounter of 09/02/19  SARS Coronavirus 2 by RT PCR (hospital order, performed in [facility] hospital lab) Nasopharyngeal Nasopharyngeal Swab     Status: None   Collection Time: 09/02/19  8:17 AM   Specimen: Nasopharyngeal Swab  Result Value Ref Range Status   SARS Coronavirus 2 NEGATIVE NEGATIVE Final    Comment: (NOTE) SARS-CoV-2 target nucleic acids are NOT DETECTED. The SARS-CoV-2 RNA is generally detectable in upper and lower respiratory specimens during the acute phase of infection. The lowest concentration of SARS-CoV-2 viral copies this assay can detect is 250 copies / mL. A negative result does not preclude SARS-CoV-2 infection and should not be used as the sole basis for treatment or other patient management decisions.  A negative result may occur with improper specimen collection / handling, submission of specimen other than nasopharyngeal swab, presence of viral mutation(s) within the areas targeted by this assay, and inadequate number of viral copies (<250 copies / mL). A negative result must be  combined with clinical observations, patient history, and epidemiological information. Fact Sheet for Patients:   StrictlyIdeas.no Fact Sheet for Healthcare Providers: BankingDealers.co.za This test is not yet approved or cleared  by the [place] FDA and has been authorized for detection and/or diagnosis of SARS-CoV-2 by FDA under an Emergency Use Authorization (EUA).  This EUA will remain in effect (meaning this test can be used) for the duration of the COVID-19 declaration under Section 564(b)(1) of the Act, 21 U.S.C. section 360bbb-3(b)(1), unless the authorization is terminated or revoked sooner. Performed at [facility], [address], [place], [place] [address]    Assessment & Plan:  86 year old female with no known urologic history presents following a fall in which she sustained left fibula and right ulnar fractures and subsequently reported a 3-day history of urinary symptoms including dysuria, lower abdominal distention, and the inability to urinate. Bladder scan reveals acute urinary retention with volume >1L.  I proceeded to place a Foley catheter at the bedside today. Using sterile swabs and chlorhexidine gluconate solution due to her known iodine allergy, I prepped the patient in a sterile fashion. A 16 FR silicone foley catheter was inserted, urine return was noted 863ml, urine was yellow in color.  The balloon was filled with 10cc of sterile water.  A night bag was attached for drainage. Patient tolerated well. Of note, her urethral meatus was noted to be quite posterior and located inside the anterior wall of the vagina.  Etiology of urinary retention is unclear in this patient with no significant urologic history. UA reassuring for infection. May consider brain imaging to rule out neurologic causes of urinary retention and fall, though this would be an unusual presentation in the absence of other focal neurologic  deficits.   Ultimately, patient should keep Foley catheter for bladder rest following episode of acute urinary retention with plans for outpatient voiding trial. I do not recommend tamsulosin due to her age and recent fall with concerns for orthostatic hypotension.  Recommendations: -Keep Foley, will arrange for outpatient voiding trial in 7 to 10 days  Thank you for involving me in this patient's care, please page with any further questions or concerns.  Debroah Loop, PA-C 09/02/2019 5:01 PM

## 2019-09-02 NOTE — ED Notes (Signed)
Pt sleeping. 

## 2019-09-02 NOTE — ED Notes (Signed)
Per patients daughter, she has reactions to any pain meds but Tylenol. Patient and daughter request tylenol. MD made aware

## 2019-09-02 NOTE — ED Notes (Signed)
Resumed care from [facility] rn.  Pt alert.  ocl splints in place. [place] [facility] in with pt now for placement of foley cath.  Pt tolerated well.  67fr foley cath inserted with out diff.  Family with pt.

## 2019-09-02 NOTE — ED Notes (Signed)
Pt transported to xray 

## 2019-09-02 NOTE — ED Provider Notes (Addendum)
[facility] Emergency Department Provider Note  ____________________________________________   First MD Initiated Contact with Patient 09/02/19 [phone]     (approximate)  I have reviewed the triage vital signs and the nursing notes.   HISTORY  Chief Complaint Fall    HPI Destiny Neal is a 86 y.o. female with medical history as listed below who presents from [facility]  for evaluation of pain after a fall.  She reports that she got up and went to the bathroom and then fell after she had gone to the restroom.  She does not believe that she struck her head nor lost consciousness and she has no pain in her head or her neck.  She has pain in both of her wrists as well as pain throughout her left leg starting at the hip and going all the way down to her foot or ankle.  Moving around makes it worse and nothing in particular makes it better other than remaining still.  She has no chest pain, shortness of breath, nor abdominal pain.        Past Medical History:  Diagnosis Date   Arthritis    hips   Breast cancer ([place])    Depression    Hypertension    Hypothyroidism    Migraines    none in 8-10 yrs   Wears hearing aid    bilateral    Patient Active Problem List   Diagnosis Date Noted   Essential hypertension, benign 03/07/2018   Cerebrovascular accident (CVA) due to occlusion of left posterior cerebral artery ([place]) 03/07/2018   Fracture of femoral neck, right ([place]) 03/07/2018   Pernicious anemia 03/07/2018   Acquired hypothyroidism 03/07/2018   Dyslipidemia 03/07/2018   Depression, major, single episode, mild ([place]) 03/07/2018   Peripheral neuropathy 03/07/2018   Glaucoma 03/07/2018   Hip fracture ([place]) 03/01/2018    Past Surgical History:  Procedure Laterality Date   BREAST LUMPECTOMY     CATARACT EXTRACTION W/PHACO Left 06/21/2015   Procedure: CATARACT EXTRACTION PHACO AND INTRAOCULAR LENS PLACEMENT ([place]);  Surgeon: Ronnell Freshwater, MD;  Location: [place];  Service: Ophthalmology;  Laterality: Left;   CATARACT EXTRACTION W/PHACO Right 07/12/2015   Procedure: CATARACT EXTRACTION PHACO AND INTRAOCULAR LENS PLACEMENT ([place]) right;  Surgeon: Ronnell Freshwater, MD;  Location: [place];  Service: Ophthalmology;  Laterality: Right;   HIP ARTHROPLASTY Right 03/01/2018   Procedure: ARTHROPLASTY BIPOLAR HIP (HEMIARTHROPLASTY);  Surgeon: Corky Mull, MD;  Location: [facility];  Service: Orthopedics;  Laterality: Right;   JOINT REPLACEMENT Left    total hip    Prior to Admission medications   Medication Sig Start Date End Date Taking? Authorizing Provider  acetaminophen (TYLENOL) 325 MG tablet Take 500 mg by mouth every 8 (eight) hours as needed. for mild to moderate pain or fever >100.5 03/19/18   [provider]  amLODipine (NORVASC) 5 MG tablet Take 1 tablet (5 mg total) by mouth daily. 07/05/16   Epifanio Lesches, MD  aspirin 81 MG tablet Take 81 mg by mouth daily.    [provider]  clopidogrel (PLAVIX) 75 MG tablet Take 1 tablet (75 mg total) by mouth daily. 07/05/16   Epifanio Lesches, MD  cyanocobalamin (,VITAMIN B-12,) 1000 MCG/ML injection Inject 1 mL into the muscle every 30 (thirty) days. 02/23/18   [provider]  dorzolamide-timolol (COSOPT) 22.3-6.8 MG/ML ophthalmic solution Place 1 drop into the left eye 2 (two) times daily.  [provider]  gabapentin (NEURONTIN) 100 MG capsule Take 100 mg by mouth 2 (two) times daily as needed.  02/25/18   [provider]  hydrALAZINE (APRESOLINE) 25 MG tablet Take 25 mg by mouth every 12 (twelve) hours. 03/05/18   [provider]  latanoprost (XALATAN) 0.005 % ophthalmic solution Place 1 drop into both eyes at bedtime. glaucoma- wait 5 minutes between multiple drops    [provider]  Multiple Vitamin (MULTIVITAMIN) capsule Take 1 capsule by mouth daily.     [provider]  NON FORMULARY Diet Type: Regular    [provider]  PARoxetine (PAXIL) 10 MG tablet Take 5 mg by mouth daily.    [provider]  rosuvastatin (CRESTOR) 5 MG tablet Take 1 tablet (5 mg total) by mouth daily at 6 PM. 07/05/16   Epifanio Lesches, MD  SALINE NASAL SPRAY NA Place 2 sprays into both nostrils as needed. 03/07/18   [provider]    Allergies Lincocin [lincomycin hcl], Chlorzoxazone, Codeine, Doxycycline, Hydrochlorothiazide, Hydrocodone, Metronidazole, Oxycodone, Penicillins, Tramadol, Betadine [povidone iodine], and Iodine  History reviewed. No pertinent family history.  Social History Social History   Tobacco Use   Smoking status: Never Smoker   Smokeless tobacco: Never Used  Substance Use Topics   Alcohol use: No   Drug use: Never    Review of Systems Constitutional: No fever/chills Eyes: No visual changes. ENT: No sore throat. Cardiovascular: Denies chest pain. Respiratory: Denies shortness of breath. Gastrointestinal: No abdominal pain.  No nausea, no vomiting.  No diarrhea.  No constipation. Genitourinary: Negative for dysuria. Musculoskeletal: Denies headache and neck pain.  Reports pain in bilateral wrists and throughout the left leg. Integumentary: Negative for rash. Neurological: Negative for headaches, focal weakness or numbness.   ____________________________________________   PHYSICAL EXAM:  VITAL SIGNS: ED Triage Vitals  Enc Vitals Group     BP 09/02/19 0348 (!) 183/101     Pulse Rate 09/02/19 0348 62     Resp 09/02/19 0348 18     Temp 09/02/19 0348 97.6 F (36.4 C)     Temp Source 09/02/19 0348 Oral     SpO2 09/02/19 0348 97 %     Weight 09/02/19 0346 77.1 kg (170 lb)     Height 09/02/19 0346 1.702 m (5\' 7" )     Head Circumference --      Peak Flow --      Pain Score 09/02/19 0345 8     Pain Loc --      Pain Edu? --      Excl. in [place]? --     Constitutional: Alert and  oriented.  Eyes: Conjunctivae are normal.  Head: Atraumatic. Nose: No congestion/rhinnorhea. Mouth/Throat: Patient is wearing a mask. Neck: No stridor.  No meningeal signs.   Cardiovascular: Normal rate, regular rhythm. Good peripheral circulation. Grossly normal heart sounds. Respiratory: Normal respiratory effort.  No retractions. Gastrointestinal: Soft and nontender. No distention.  Musculoskeletal: No gross deformities of her wrist but she has tenderness to palpation of both wrists just proximal to the joint on both sides.  However there is no reproducible pain with passive flexion and extension of her wrist.  No tenderness at the snuffbox is of either hand or evidence of more proximal injury on her upper extremities.  The patient has no tenderness to palpation of the right lower extremity including the pelvis and has no pain or tenderness with passive flexion extension of the hip, knee, ankle, nor foot.  However on the left lower extremity she has tenderness to palpation around the left hip and with any amount of movement of the left leg including hip movement, knee flexion extension, palpation and manipulation of the ankle.  The leg appears to be slightly externally rotated but is not significantly shortened.  Neurovascularly intact. Neurologic:  Normal speech and language. No gross focal neurologic deficits are appreciated.  Skin:  Skin is warm, dry and intact. Psychiatric: Mood and affect are normal. Speech and behavior are normal.  ____________________________________________   LABS (all labs ordered are listed, but only abnormal results are displayed)  Labs Reviewed  SARS CORONAVIRUS 2 BY RT PCR (HOSPITAL ORDER, [place] [facility])  CBC WITH DIFFERENTIAL/PLATELET  BASIC METABOLIC PANEL  URINALYSIS, ROUTINE W REFLEX MICROSCOPIC   ____________________________________________  EKG  No indication for emergent  EKG ____________________________________________  RADIOLOGY Ursula Alert, personally viewed and evaluated these images (plain radiographs) as part of my medical decision making, as well as reviewing the written report by the radiologist.  ED MD interpretation:   Normal: --  No evidence of fracture nor dislocation in the left wrist. --  Normal radiographs of left knee --  No acute abnormality identified in pelvis nor left hip.  Abnormal: --  Nondisplaced oblique fracture of the distal ulna on the right. --  Nondisplaced distal fibular (ankle) fracture on the left.   Official radiology report(s): DG Wrist Complete Left  Result Date: 09/02/2019 CLINICAL DATA:  Fall with bilateral wrist pain.  Initial encounter. EXAM: LEFT WRIST - COMPLETE 3+ VIEW COMPARISON:  None. FINDINGS: There is no evidence of fracture or dislocation. Osteopenia and chondrocalcinosis. IMPRESSION: No acute finding. Electronically Signed   By: [place] Fantasia M.D.   On: 09/02/2019 05:58   DG Wrist Complete Right  Result Date: 09/02/2019 CLINICAL DATA:  Pain after fall. EXAM: RIGHT WRIST - COMPLETE 3+ VIEW COMPARISON:  No recent. FINDINGS: Diffuse osteopenia. Mild degenerative change. Nondisplaced oblique fracture noted of the distal ulna. Small Corticated bony densities noted adjacent to the distal tip of the ulna consistent with small fracture fragments, possibly old. Distal radius is intact. IMPRESSION: Nondisplaced oblique fracture noted of the distal ulna. Small corticated bony densities noted adjacent to the distal tip of the ulna consistent small fracture fragments, possibly old. Distal radius is intact. Electronically Signed   By: Marcello Moores  Register   On: 09/02/2019 06:00   DG Knee 2 Views Left  Result Date: 09/02/2019 CLINICAL DATA:  Pain after fall EXAM: LEFT KNEE - 1-2 VIEW COMPARISON:  None. FINDINGS: There is diffuse osteopenia which somewhat limits evaluation. No definite displaced fracture is seen. No large  knee joint effusion. Mild prepatellar subcutaneous edema is noted. IMPRESSION: Diffuse osteopenia which somewhat limits evaluation, however no definite displaced fracture. Electronically Signed   By: Prudencio Pair M.D.   On: 09/02/2019 06:00   DG Ankle Complete Left  Result Date: 09/02/2019 CLINICAL DATA:  Pain after fall EXAM: LEFT ANKLE COMPLETE - 3+ VIEW COMPARISON:  None. FINDINGS: There is a nondisplaced obliquely oriented distal fibular fracture at the level of the ankle mortise. No ankle mortise widening. Significant overlying soft tissue swelling seen around the lateral malleolus. There is diffuse osteopenia. IMPRESSION: Nondisplaced distal fibular fracture at the level of the ankle mortise. Overlying soft tissue swelling. Electronically Signed   By: Prudencio Pair M.D.   On: 09/02/2019 06:01   DG Hip Unilat W or Wo Pelvis 2-3 Views Left  Result Date: 09/02/2019 CLINICAL  DATA:  Pain after fall EXAM: DG HIP (WITH OR WITHOUT PELVIS) 2-3V LEFT COMPARISON:  03/01/2018 FINDINGS: Bilateral hip arthroplasty with notable protrusio on the left. Remote left pubic body fracture. No dislocation. Osteopenia and partially covered degenerated lumbar spine with scoliosis IMPRESSION: 1. No acute finding. 2. Bilateral hip arthroplasty with protrusio on the left. Electronically Signed   By: [place] Fantasia M.D.   On: 09/02/2019 06:00    ____________________________________________   PROCEDURES   Procedure(s) performed (including Critical Care):  .Ortho Injury Treatment  Date/Time: 09/02/2019 11:36 PM Performed by: Hinda Kehr, MD Authorized by: Hinda Kehr, MD   Consent:    Consent obtained:  Verbal   Consent given by:  Patient   Risks discussed:  Fracture, restricted joint movement and stiffness   Alternatives discussed:  No treatmentInjury location: wrist Location details: right wrist Injury type: fracture (distal ulna) Pre-procedure neurovascular assessment: neurovascularly intact Pre-procedure  distal perfusion: normal Pre-procedure neurological function: normal Pre-procedure range of motion: reduced  Anesthesia: Local anesthesia used: no  Patient sedated: NoManipulation performed: no Immobilization: splint Splint type: sugar tong Supplies used: Ortho-Glass Post-procedure neurovascular assessment: post-procedure neurovascularly intact Post-procedure distal perfusion: normal Post-procedure neurological function: normal Post-procedure range of motion: unchanged Patient tolerance: patient tolerated the procedure well with no immediate complications  .Ortho Injury Treatment  Date/Time: 09/02/2019 11:37 PM Performed by: Hinda Kehr, MD Authorized by: Hinda Kehr, MD   Consent:    Consent obtained:  Verbal   Consent given by:  Patient   Risks discussed:  Fracture, stiffness and restricted joint movement   Alternatives discussed:  No treatmentInjury location: ankle Location details: left ankle Injury type: fracture (distal fibula) Pre-procedure neurovascular assessment: neurovascularly intact Pre-procedure distal perfusion: normal Pre-procedure neurological function: normal Pre-procedure range of motion: reduced  Anesthesia: Local anesthesia used: no  Patient sedated: NoManipulation performed: no Immobilization: splint Splint type: short leg Supplies used: Ortho-Glass Post-procedure neurovascular assessment: post-procedure neurovascularly intact Post-procedure distal perfusion: normal Post-procedure neurological function: normal Post-procedure range of motion: unchanged Patient tolerance: patient tolerated the procedure well with no immediate complications      ____________________________________________   INITIAL IMPRESSION / MDM / [place] / ED COURSE  As part of my medical decision making, I reviewed the following data within the [place] notes reviewed and incorporated, Labs reviewed , Old chart reviewed, Patient  signed out to Dr. Cinda Quest, Radiograph reviewed , Discussed with orthopedics (Dr. Rudene Christians) and reviewed Notes from prior ED visits   Differential diagnosis includes, but is not limited to, contusion, fracture, musculoskeletal strain.  The patient has no evidence of head or neck injury.  She is alert and oriented in spite of her advanced age.  However she is reporting pain in her wrists and left leg as documented above.  Physical exam is generally reassuring except for the reproducible pain; there are no gross abnormalities noted.  I have ordered x-rays of all of the areas that are tender and I will reassess after the imaging.       Clinical Course as of Sep 02 739  Tue Sep 02, 2019  0714 Discussed case by phone wtih Dr. Rudene Christians with orthopedics.  He agreed that she needs splints and follow up with ortho next week, but that she does not meet admission criteria.  Ordering PT/OT, transition of care consults.  Also ordering basic labs.  Transferred ED care to Dr. Cinda Quest to follow up labs and PT & social work recommendations.   [CF]  Clinical Course User Index [CF] Hinda Kehr, MD     ____________________________________________  FINAL CLINICAL IMPRESSION(S) / ED DIAGNOSES  Final diagnoses:  Fall, initial encounter  Closed fracture of distal end of right ulna, unspecified fracture morphology, initial encounter  Closed fracture of distal end of left fibula, unspecified fracture morphology, initial encounter     MEDICATIONS GIVEN DURING THIS VISIT:  Medications - No data to display   ED Discharge Orders    None      *Please note:  RAYNE NOGALES was evaluated in Emergency Department on 09/02/2019 for the symptoms described in the history of present illness. She was evaluated in the context of the global COVID-19 pandemic, which necessitated consideration that the patient might be at risk for infection with the SARS-CoV-2 virus that causes COVID-19. Institutional protocols and  algorithms that pertain to the evaluation of patients at risk for COVID-19 are in a state of rapid change based on information released by regulatory bodies including the CDC and federal and state organizations. These policies and algorithms were followed during the patient's care in the ED.  Some ED evaluations and interventions may be delayed as a result of limited staffing during the pandemic.*  Note:  This document was prepared using Dragon voice recognition software and may include unintentional dictation errors.   Hinda Kehr, MD 09/02/19 [id number]    Hinda Kehr, MD 09/02/19 [phone]

## 2019-09-02 NOTE — ED Notes (Signed)
Pt does not want a hospital bed.  Pt states she would rather stay on the stretcher. Family with pt.  Pt waiting on SNF placement  Family aware.  Pt alert  Splints remain in place.

## 2019-09-02 NOTE — NC FL2 (Signed)
[place] LEVEL OF CARE SCREENING TOOL     IDENTIFICATION  Patient Name: Destiny Neal Birthdate: 02/14/1921 Sex: female Admission Date (Current Location): 09/02/2019  [place] and [place] Number:  Engineering geologist and Address:  [facility], [address] [place], [place] [address]      Provider Number: [id number]  Attending Physician Name and Address:  Nena Polio, MD  Relative Name and Phone Number:  Emah Dahir    Current Level of Care: Hospital Recommended Level of Care: [place] Prior Approval Number:    Date Approved/Denied:   PASRR Number: [id number]  Discharge Plan: SNF    Current Diagnoses: Patient Active Problem List   Diagnosis Date Noted  . Essential hypertension, benign 03/07/2018  . Cerebrovascular accident (CVA) due to occlusion of left posterior cerebral artery ([place]) 03/07/2018  . Fracture of femoral neck, right ([place]) 03/07/2018  . Pernicious anemia 03/07/2018  . Acquired hypothyroidism 03/07/2018  . Dyslipidemia 03/07/2018  . Depression, major, single episode, mild ([place]) 03/07/2018  . Peripheral neuropathy 03/07/2018  . Glaucoma 03/07/2018  . Hip fracture ([place]) 03/01/2018    Orientation RESPIRATION BLADDER Height & Weight     Self, Time, Situation, Place  Normal Continent Weight: 77.1 kg Height:  5\' 7"  (170.2 cm)  BEHAVIORAL SYMPTOMS/MOOD NEUROLOGICAL BOWEL NUTRITION STATUS      Continent Diet  AMBULATORY STATUS COMMUNICATION OF NEEDS Skin   Extensive Assist Verbally Normal                       Personal Care Assistance Level of Assistance  Bathing, Dressing Bathing Assistance: Limited assistance   Dressing Assistance: Limited assistance     Functional Limitations Info  Sight, Hearing Sight Info: Impaired(Eyeglasses) Hearing Info: Impaired(Bilateral hearing aides)      [place]  PT (By licensed PT), OT (By licensed OT)     PT Frequency:  min5xweek OT Frequency: min 5xweek            Contractures      Additional Factors Info                  Current Medications (09/02/2019):  This is the current hospital active medication list No current facility-administered medications for this encounter.   Current Outpatient Medications  Medication Sig Dispense Refill  . acetaminophen (TYLENOL) 325 MG tablet Take 500 mg by mouth every 8 (eight) hours as needed. for mild to moderate pain or fever >100.5    . amLODipine (NORVASC) 5 MG tablet Take 1 tablet (5 mg total) by mouth daily. 30 tablet 0  . aspirin 81 MG tablet Take 81 mg by mouth daily.    . Cholecalciferol (VITAMIN D3) 50 MCG (2000 UT) TABS Take 4,000 Units by mouth daily at 12 noon.    . clopidogrel (PLAVIX) 75 MG tablet Take 1 tablet (75 mg total) by mouth daily. 30 tablet 0  . cyanocobalamin (,VITAMIN B-12,) 1000 MCG/ML injection Inject 1 mL into the muscle every 30 (thirty) days.    Marland Kitchen docusate sodium (COLACE) 100 MG capsule Take 100 mg by mouth 2 (two) times daily.    . dorzolamide-timolol (COSOPT) 22.3-6.8 MG/ML ophthalmic solution Place 1 drop into the left eye 2 (two) times daily.     Marland Kitchen gabapentin (NEURONTIN) 100 MG capsule Take 100 mg by mouth 2 (two) times daily as needed.     . hydrALAZINE (APRESOLINE) 25 MG tablet Take 25 mg by  mouth every 12 (twelve) hours.    Marland Kitchen latanoprost (XALATAN) 0.005 % ophthalmic solution Place 1 drop into both eyes at bedtime. glaucoma- wait 5 minutes between multiple drops    . melatonin 5 MG TABS Take 5 mg by mouth at bedtime.    . Multiple Vitamin (MULTIVITAMIN) capsule Take 1 capsule by mouth daily.    Marland Kitchen PARoxetine (PAXIL) 10 MG tablet Take 10 mg by mouth daily.     . rosuvastatin (CRESTOR) 5 MG tablet Take 1 tablet (5 mg total) by mouth daily at 6 PM. 30 tablet 0  . traZODone (DESYREL) 50 MG tablet Take 50 mg by mouth at bedtime.    . vitamin B-12 (CYANOCOBALAMIN) 1000 MCG tablet Take 1,000 mcg by mouth daily.    . NON  FORMULARY Diet Type: Regular    . SALINE NASAL SPRAY NA Place 2 sprays into both nostrils as needed.       Discharge Medications: Please see discharge summary for a list of discharge medications.  Relevant Imaging Results:  Relevant Lab Results:   Additional Information SS#[id number]  Anselm Pancoast, RN

## 2019-09-03 MED ORDER — LORAZEPAM 2 MG/ML IJ SOLN
0.5000 mg | Freq: Once | INTRAMUSCULAR | Status: AC
  Administered 2019-09-03: 0.5 mg via INTRAVENOUS
  Filled 2019-09-03: qty 1

## 2019-09-03 NOTE — Progress Notes (Addendum)
Physical Therapy Treatment Patient Details Name: Destiny Neal MRN: [id number] DOB: 1920/04/28 Today's Date: 09/03/2019    History of Present Illness Denai Caba is a 40yoF who comes to [facility] afer a fall, slipped in BR. Imaging reveals LLE fracture, RUE fracture, both splinted. Details include Left pubic body fracture (remote), Left ankle Nondisplaced fibular fracture at the level of the ankle mortise, nondisplaced oblique fracture noted of the Right distal ulna. ED MD gives LLE NWB, RLE FWB. RUE NWB is assumed at time of eval. All fractures are being managed conservatively. PMH: BrCA, HTN, hypoTSH, HOH, cerebellar infarct 2018, Lt THA. Pt fell in 2019 and sustained a Rt hip fracture, now s/p HHA. Hx of Rt humeral neck fracture 2015.    PT Comments    Pt in ED gurney upon arrival,  Son at bedside. Pt does not remember author from previous day. Pt is more disoriented and confused this date, but still able to follow simple cues for mobility. MaxA to EOB. Plastic 4" step used to allow pt to rest feet while seated. Pt given multimodal cues for seated exercise. Pt practices 2x2 STS transfers from EOB dependent level modA, repeated cues given for patient to avoid RUE use. Pt maintains LLE TTWB. Pt having pain each time up and down, eventually makes it known that she is having/needs to have both a present and imminent bowel movement and this is what is causing her pain. NSG consulted who brings recliner to room. MaxA SPT EOB to recliner, then recliner to toilet. ModA STS x2.5 minutes wherein NA assists with pericare cleanup of bowel. MaxA SPT toilet to recliner. NSG/authro assist with diaper placement/chair set-up. Pt made comfortable at EOS, legs elevated, Son in room. Pt exhausted after extensive transfers training this date.     Follow Up Recommendations  SNF;Supervision for mobility/OOB;Supervision - Intermittent     Equipment Recommendations  Other (comment)    Recommendations for Other  Services       Precautions / Restrictions Precautions Precautions: Fall Restrictions Weight Bearing Restrictions: Yes RUE Weight Bearing: Non weight bearing(assumed) RLE Weight Bearing: (FWB) LLE Weight Bearing: Touchdown weight bearing Other Position/Activity Restrictions: both LLE and RUE in splint at entry    Mobility  Bed Mobility Overal bed mobility: Needs Assistance Bed Mobility: Supine to Sit Rolling: Min assist   Supine to sit: Max assist Sit to supine: Mod assist   General bed mobility comments: pt likely has physical ability to complete transfer, but was limited by decreased command following and pain.  Needs cues to avoid RUE use  Transfers Overall transfer level: Needs assistance Equipment used: 1 person hand held assist Transfers: Sit to/from [employer] Transfers Sit to Stand: Mod assist Stand pivot transfers: Max assist(anxious and confused, but holds on tight)       General transfer comment: dependent transfer, cues for LUE grip, RUE DC 8x total in session  Ambulation/Gait Ambulation/Gait assistance: (not yet safe to attempt)               Stairs             Wheelchair Mobility    Modified Rankin (Stroke Patients Only)       Balance Overall balance assessment: Modified Independent;History of Falls Sitting-balance support: No upper extremity supported;Feet unsupported Sitting balance-Leahy Scale: Good     Standing balance support: During functional activity Standing balance-Leahy Scale: Poor Standing balance comment: not tested  Cognition Arousal/Alertness: Awake/alert Behavior During Therapy: WFL for tasks assessed/performed;Anxious Overall Cognitive Status: Impaired/Different from baseline Area of Impairment: Orientation;Following commands                 Orientation Level: Disoriented to;Place;Time;Situation     Following Commands: Follows one step commands  consistently;Follows one step commands with increased time       General Comments: decreased command following but grossly oriented      Exercises General Exercises - Lower Extremity Long Arc Quad: AROM;10 reps;Both;Seated Hip Flexion/Marching: AROM;Left;10 reps;Seated Other Exercises Other Exercises: provided education re: OT role and plan of care, fall and safety precautions, self care, bed mobility, weight bearing precautions    General Comments        Pertinent Vitals/Pain Pain Assessment: Faces Faces Pain Scale: Hurts even more Pain Location: "BM" Pain Descriptors / Indicators: Aching;Guarding;Grimacing;Discomfort;Moaning Pain Intervention(s): Limited activity within patient's tolerance;Monitored during session;Repositioned    Home Living Family/patient expects to be discharged to:: Assisted living([facility])     Type of Home: Assisted living       Home Equipment: Gilford Rile - 2 wheels;Walker - 4 wheels;Shower seat - built in      Prior Function Level of Independence: Needs assistance  Gait / Transfers Assistance Needed: facility mobility with RW or 4WW, most recently 4WW ADL's / Homemaking Assistance Needed: staff assists with bathing, pt is completing dressing/grooming independently but pt's son reports this has resulted in falls.  Pt ambulates with 4WW to dining room for meals, medications managed by ALF Comments: [name] son reports pt has had several falls recently   PT Goals (current goals can now be found in the care plan section) Acute Rehab PT Goals Patient Stated Goal: stop falling, be independent with mobility and ADLs PT Goal Formulation: With patient Time For Goal Achievement: 09/16/19 Potential to Achieve Goals: Fair Progress towards PT goals: Progressing toward goals    Frequency    7X/week      PT Plan Current plan remains appropriate    Co-evaluation              AM-PAC PT "6 Clicks" Mobility   Outcome Measure  Help needed turning  from your back to your side while in a flat bed without using bedrails?: Total Help needed moving from lying on your back to sitting on the side of a flat bed without using bedrails?: Total Help needed moving to and from a bed to a chair (including a wheelchair)?: Total Help needed standing up from a chair using your arms (e.g., wheelchair or bedside chair)?: Total Help needed to walk in hospital room?: Total Help needed climbing 3-5 steps with a railing? : Total 6 Click Score: 6    End of Session Equipment Utilized During Treatment: Gait belt Activity Tolerance: Patient tolerated treatment well;No increased pain Patient left: with call bell/phone within reach;in chair;with family/visitor present Nurse Communication: Mobility status;Precautions;Weight bearing status PT Visit Diagnosis: Unsteadiness on feet (R26.81);Other abnormalities of gait and mobility (R26.89);History of falling (Z91.81)     Time: 5789-7847 PT Time Calculation (min) (ACUTE ONLY): 33 min  Charges:  $Therapeutic Exercise: 23-37 mins                     12:19 PM, 09/03/19 Etta Grandchild, PT, DPT Physical Therapist - [facility]  [phone] ([place])    [place] 09/03/2019, 12:10 PM

## 2019-09-03 NOTE — ED Notes (Signed)
Patient moved from room 18 in recliner. Son at bedside with patient. Belongings moved with patient. No needs expressed at this time.

## 2019-09-03 NOTE — ED Notes (Signed)
Telesitter stating a sitter would not be available until 10pm. Agricultural consultant made aware.

## 2019-09-03 NOTE — ED Notes (Signed)
Patient's call bell was on and this RN entered room.  Patient states she did not hit her call bell.  Patient asking this RN about cobwebs in the room.  This RN could not find any. Patient states, "I need to get the house ready for my children."  Patient occasionally moving her leg and moaning in pain.  This RN asked patient to rate her pain 0-10 and patient seemed to answer with a phone number.

## 2019-09-03 NOTE — ED Notes (Signed)
PT at bedside.

## 2019-09-03 NOTE — ED Notes (Signed)
Pt repeatedly trying to get out of bed. This RN instructing pt to use call bell and stay in bed until help arrives. Pt continues to states she has to pee but this RN reminding pt she has a foley in place. MD made aware.

## 2019-09-03 NOTE — Evaluation (Signed)
Occupational Therapy Evaluation Patient Details Name: Destiny Neal MRN: [id number] DOB: [birth year] Today's Date: 09/03/2019    History of Present Illness Destiny Neal is a 44yoF who comes to [facility] afer a fall, slipped in BR. Imaging reveals LLE fracture, RUE fracture, both splinted. Details include Left pubic body fracture (remote), Left ankle Nondisplaced fibular fracture at the level of the ankle mortise, nondisplaced oblique fracture noted of the Right distal ulna. ED MD gives LLE NWB, RLE FWB. RUE NWB is assumed at time of eval. All fractures are being managed conservatively. PMH: BrCA, HTN, hypoTSH, HOH, cerebellar infarct 2018, Lt THA. Pt fell in 2019 and sustained a Rt hip fracture, now s/p HHA. Hx of Rt humeral neck fracture 2015.   Clinical Impression   Destiny Neal presents to OT with pain, weight bearing precautions, and decreased strength and balance that impacts her ability to safely and independently complete functional tasks.  Prior to admission, pt lived at [facility].  Pt received assistance from staff for meals, household maintenance, medication management, and bathing.  Pt was modified independent in dressing, grooming, and mobility with 4WW or RW.  However, pt's son reports that pt has had several falls recently while getting dressed or navigating room.  Currently, pt is limited by pain and weight bearing status in RUE and LLE.  OTR provided education to pt and pt's son on weight bearing precautions, self care, and functional mobility.  OTR provided max assist for supine > sit transfer, as pt is limited by decreased command following and pain.  Did not attempt OOB mobility 2/2 pt likely needing hemiwalker, but pt requires mod-max assist for sit to stand transfer per PT report.  Pt likely requires mod-max assist in all ADLs, including feeding, grooming, dressing, bathing, and toileting 2/2 weight bearing precautions.  Destiny Neal will continue to benefit  from skilled OT services in acute setting to address pain, recurrent falls, and safety and independence in ADLs.  Recommend SNF upon discharge.    Follow Up Recommendations  SNF    Equipment Recommendations  None recommended by OT    Recommendations for Other Services       Precautions / Restrictions Precautions Precautions: Fall Restrictions Weight Bearing Restrictions: Yes RUE Weight Bearing: Non weight bearing RLE Weight Bearing: (full weight bearing) LLE Weight Bearing: Touchdown weight bearing Other Position/Activity Restrictions: both LLE and RUE in splint at entry      Mobility Bed Mobility Overal bed mobility: Needs Assistance Bed Mobility: Rolling;Supine to Sit;Sit to Supine Rolling: Min assist   Supine to sit: Max assist Sit to supine: Mod assist   General bed mobility comments: pt likely has physical ability to complete transfer, but was limited by decreased command following and pain.  Needs cues to avoid RUE use  Transfers Overall transfer level: Needs assistance Equipment used: 1 person hand held assist Transfers: Sit to/from Stand Sit to Stand: Mod assist         General transfer comment: Per PT report    Balance Overall balance assessment: Needs assistance;History of Falls Sitting-balance support: Single extremity supported Sitting balance-Leahy Scale: Good         Standing balance comment: not tested                           ADL either performed or assessed with clinical judgement   ADL Overall ADL's : Needs assistance/impaired Eating/Feeding: Moderate assistance;Sitting   Grooming: Moderate assistance;Sitting  Upper Body Bathing: Moderate assistance;Sitting   Lower Body Bathing: Maximal assistance;Sitting/lateral leans Lower Body Bathing Details (indicate cue type and reason): from clinical reasoning Upper Body Dressing : Maximal assistance;Sitting   Lower Body Dressing: Maximal assistance;Sitting/lateral leans      Toilet Transfer Details (indicate cue type and reason): not tested           General ADL Comments: Sit to stand transfer not tested 2/2 pt pain and NWB status, pt likely needs hemiwalker for OOB mobility.  Per PT report, pt required max assist for hand held assist/dependent style sit to stand transfer.  Pt requires mod-max assist for all ADLs 2/2 NWB status of RUE and LLE.     Vision Baseline Vision/History: Wears glasses;Glaucoma Wears Glasses: At all times Patient Visual Report: No change from baseline       Perception     Praxis      Pertinent Vitals/Pain Pain Assessment: Faces Faces Pain Scale: Hurts even more Pain Location: LLE Pain Descriptors / Indicators: Aching;Guarding;Grimacing;Discomfort;Moaning Pain Intervention(s): Limited activity within patient's tolerance;Monitored during session     Hand Dominance     Extremity/Trunk Assessment Upper Extremity Assessment Upper Extremity Assessment: RUE deficits/detail;LUE deficits/detail RUE Deficits / Details: NWB, pain RUE: Unable to fully assess due to pain;Unable to fully assess due to immobilization LUE Deficits / Details: grossly 4/5 strength, adequate grasp   Lower Extremity Assessment Lower Extremity Assessment: LLE deficits/detail;RLE deficits/detail RLE Deficits / Details: sufficient ROM to complete figure 4 position, did not test strength LLE Deficits / Details: NWB, pain LLE: Unable to fully assess due to pain;Unable to fully assess due to immobilization       Communication Communication Communication: HOH   Cognition Arousal/Alertness: Awake/alert Behavior During Therapy: WFL for tasks assessed/performed Overall Cognitive Status: Within Functional Limits for tasks assessed                                 General Comments: decreased command following but grossly oriented   General Comments       Exercises Other Exercises Other Exercises: provided education re: OT role and plan of  care, fall and safety precautions, self care, bed mobility, weight bearing precautions   Shoulder Instructions      Home Living Family/patient expects to be discharged to:: Assisted living([facility])     Type of Home: Assisted living                       Home Equipment: Gilford Rile - 2 wheels;Walker - 4 wheels;Shower seat - built in          Prior Functioning/Environment Level of Independence: Needs assistance  Gait / Transfers Assistance Needed: facility mobility with RW or 4WW, most recently 4WW ADL's / Homemaking Assistance Needed: staff assists with bathing, pt is completing dressing/grooming independently but pt's son reports this has resulted in falls.  Pt ambulates with 4WW to dining room for meals, medications managed by ALF   Comments: [name] son reports pt has had several falls recently        OT Problem List: Decreased strength;Decreased range of motion;Decreased activity tolerance;Impaired balance (sitting and/or standing);Decreased safety awareness;Decreased knowledge of use of DME or AE;Decreased knowledge of precautions;Impaired UE functional use;Pain      OT Treatment/Interventions: Self-care/ADL training;Therapeutic exercise;DME and/or AE instruction;Therapeutic activities;Patient/family education;Balance training    OT Goals(Current goals can be found in the care plan section) Acute Rehab OT  Goals Patient Stated Goal: stop falling, be independent with mobility and ADLs OT Goal Formulation: With patient/family Time For Goal Achievement: 09/17/19 Potential to Achieve Goals: Good  OT Frequency: Min 2X/week   Barriers to D/C: Decreased caregiver support          Co-evaluation              AM-PAC OT "6 Clicks" Daily Activity     Outcome Measure Help from another person eating meals?: A Little Help from another person taking care of personal grooming?: A Little Help from another person toileting, which includes using toliet, bedpan, or urinal?:  A Lot Help from another person bathing (including washing, rinsing, drying)?: A Lot Help from another person to put on and taking off regular upper body clothing?: A Lot Help from another person to put on and taking off regular lower body clothing?: A Lot 6 Click Score: 14   End of Session    Activity Tolerance: Patient limited by pain Patient left: in bed;with call bell/phone within reach;with nursing/sitter in room;with family/visitor present  OT Visit Diagnosis: Other abnormalities of gait and mobility (R26.89);History of falling (Z91.81);Pain Pain - Right/Left: (RUE, LLE) Pain - part of body: Arm;Leg(RUE, LLE)                Time: 3612-2449 OT Time Calculation (min): 22 min Charges:  OT General Charges $OT Visit: 1 Visit OT Evaluation $OT Eval Moderate Complexity: 1 Mod OT Treatments $Self Care/Home Management : 8-22 mins  Myrtie Hawk Myles Mallicoat, OTR/L 09/03/19, 9:44 AM

## 2019-09-03 NOTE — TOC Progression Note (Signed)
Transition of Care [facility]) - Progression Note    Patient Details  Name: Destiny Neal MRN: [id number] Date of Birth: November 09, 1920  Transition of Care [facility]) CM/SW Contact  Anselm Pancoast, RN Phone Number: 09/03/2019, 4:16 PM  Clinical Narrative:    Spoke with daughter via telephone and son, Rica Mote at bedside. Still waiting on insurance authorization. Confirmed with Tammy @ [facility] patient is clear to go once insurance approves. COVID test completed. Confirmed patient has had both COVID vaccines and family will bring vaccine card to [facility] with belongings. Family concerned patient is not ordering her food and requested staff assist when family is not available. ED RN notified.    Expected Discharge Plan: [place] Barriers to Discharge: Continued Medical Work up  Expected Discharge Plan and Services Expected Discharge Plan: [place] Choice: [place] arrangements for the past 2 months: Assisted Living Facility([facility])                                       Social Determinants of Health (SDOH) Interventions    Readmission Risk Interventions No flowsheet data found.

## 2019-09-03 NOTE — TOC Progression Note (Addendum)
Transition of Care [facility]) - Progression Note    Patient Details  Name: Destiny Neal MRN: [id number] Date of Birth: 1920-04-21  Transition of Care [facility]) CM/SW [place], RN Phone Number: 09/03/2019, 11:45 AM  Clinical Narrative:    Bed offer accepted from [facility]. RN CM started insurance authorization. [id number]. Faxed clinical to [id number].   Expected Discharge Plan: [place] Barriers to Discharge: Continued Medical Work up  Expected Discharge Plan and Services Expected Discharge Plan: [place] Choice: [place] arrangements for the past 2 months: Assisted Living Facility([facility])                                       Social Determinants of Health (SDOH) Interventions    Readmission Risk Interventions No flowsheet data found.

## 2019-09-03 NOTE — ED Notes (Signed)
Patient finished eating dinner. Moved into hospital bed and then transferred to room 26.

## 2019-09-03 NOTE — ED Notes (Signed)
Telesitter active and can visualize pt.

## 2019-09-03 NOTE — ED Provider Notes (Signed)
-----------------------------------------   4:24 AM on 09/03/2019 -----------------------------------------  Patient is sleeping.  I checked on her a little while earlier and she was awake and alert.  Reported no distress.  I ordered her home meds.  She is awaiting placement by social work for rehab versus SNF.   Hinda Kehr, MD 09/03/19 [phone]

## 2019-09-03 NOTE — ED Notes (Signed)
Patient sitting up in recliner eating dinner. IV removed per patient request. Son at bedside with patient. No further needs expressed at this time.

## 2019-09-03 NOTE — ED Notes (Addendum)
Pt in hospital bed in room 26. Pt with bed in lowest lockled position, side rails up and call bell in reach. This RN found pt on floor with side rails up, bed still in lowest position and call bell was not activated. Pt stating she felt wet and wanted to get out off bed. Pt re-oriented to use the call bell and the safety around staying in bed and calling for assistance. Pt stating she does not know why she did not call for help. Pt assisted back in by this RN, Raquel RN and Brittney, EDT. Dr. Joan Mayans notified to reassess pt. Pt bed alarm activated, fall matt placed on floor and order for telesitter placed.

## 2019-09-03 NOTE — ED Notes (Signed)
This RN offered patient tylenol for pain and patient refused.  Will continue to monitor.

## 2019-09-04 NOTE — ED Notes (Signed)
Report given to Kim at [facility]. Await ambulance arrival.

## 2019-09-04 NOTE — Progress Notes (Signed)
PT Cancellation Note  Patient Details Name: Destiny Neal MRN: [id number] DOB: Apr 18, 1920   Cancelled Treatment:    Reason Eval/Treat Not Completed: Fatigue/lethargy limiting ability to participate    Pt asleep upon entering room.  Discussed with son.  Attempted to awake pt for session and lunch.  Pt briefly opens eyes but quickly falls back asleep several times.  Decision to hold session this am.  Possible transfer to SNF today.  Will monitor transfer and continue as appropriate.   Chesley Noon 09/04/2019, 11:53 AM

## 2019-09-04 NOTE — ED Notes (Signed)
[facility]  CALLED  FOR  TRANSPORT

## 2019-09-04 NOTE — ED Notes (Signed)
Report received from [facility]. Patient care assumed. Patient/RN introduction complete. Will continue to monitor. Pt laying in bed with eyes closed, awaiting placement. Telesitter in place with bed alarm activated.

## 2019-09-04 NOTE — TOC Transition Note (Signed)
Transition of Care [facility]) - CM/SW Discharge Note   Patient Details  Name: Destiny Neal MRN: [id number] Date of Birth: [birth year]  Transition of Care [facility]) CM/SW Contact:  Anselm Pancoast, RN Phone Number: 09/04/2019, 8:59 AM   Clinical Narrative:    Received call from [place] @ [employer] with auth for SNF transfer. [employer] auth# [id number]. Approved for 5 days. Follow up to [facility] via fax [phone].   RN CM LVMM for Tammy [facility] updating auth had been completed and awaiting bed information and room.      Barriers to Discharge: Continued Medical Work up   Patient Goals and CMS Choice Patient states their goals for this hospitalization and ongoing recovery are:: Get back to [email or web address] Compare Post Acute Care list provided to:: Patient Choice offered to / list presented to : Adult Children, Patient  Discharge Placement                       Discharge Plan and Services     Post Acute Care Choice: Skilled Nursing Facility                               Social Determinants of Health (SDOH) Interventions     Readmission Risk Interventions No flowsheet data found.

## 2019-09-04 NOTE — TOC Transition Note (Signed)
Transition of Care [facility]) - CM/SW Discharge Note   Patient Details  Name: Destiny Neal MRN: [id number] Date of Birth: May 26, 1920  Transition of Care [facility]) CM/SW Contact:  Anselm Pancoast, RN Phone Number: 09/04/2019, 1:36 PM   Clinical Narrative:    Damaris Schooner to Gerald Stabs @ [facility]-confirmed patient is cleared for discharge. Patient will be in room 309B for tonight and will move to private room tomorrow. Family is aware. EDP and ED RN notified.      Barriers to Discharge: Continued Medical Work up   Patient Goals and CMS Choice Patient states their goals for this hospitalization and ongoing recovery are:: Get back to [facility].gov Compare Post Acute Care list provided to:: Patient Choice offered to / list presented to : Adult Children, Patient  Discharge Placement                       Discharge Plan and Services     Post Acute Care Choice: Skilled Nursing Facility                               Social Determinants of Health (SDOH) Interventions     Readmission Risk Interventions No flowsheet data found.

## 2019-09-04 NOTE — TOC Transition Note (Signed)
Transition of Care [facility]) - CM/SW Discharge Note   Patient Details  Name: Destiny Neal MRN: [id number] Date of Birth: Jun 03, 1920  [facility]) CM/SW Contact:  Anselm Pancoast, RN Phone Number: 09/04/2019, 12:04 PM   Clinical Narrative:    Damaris Schooner to Tammy @ [facility]-confirmed patient is admitting today however SNF is working to find room at this time. Tammy states she will call back with update.      Barriers to Discharge: Continued Medical Work up   Patient Goals and CMS Choice Patient states their goals for this hospitalization and ongoing recovery are:: Get back to [facility].gov Compare Post Acute Care list provided to:: Patient Choice offered to / list presented to : Adult Children, Patient  Discharge Placement                       Discharge Plan and Services     Post Acute Care Choice: Skilled Nursing Facility                               Social Determinants of Health (SDOH) Interventions     Readmission Risk Interventions No flowsheet data found.

## 2019-09-18 ENCOUNTER — Other Ambulatory Visit: Payer: Self-pay

## 2019-09-18 ENCOUNTER — Ambulatory Visit: Payer: Medicare PPO | Admitting: Physician Assistant

## 2019-09-18 ENCOUNTER — Encounter: Payer: Self-pay | Admitting: Physician Assistant

## 2019-09-18 VITALS — BP 133/71 | HR 69 | Ht 68.0 in | Wt 170.0 lb

## 2019-09-18 DIAGNOSIS — R339 Retention of urine, unspecified: Secondary | ICD-10-CM

## 2019-09-18 NOTE — Progress Notes (Signed)
Patient presented to clinic today for outpatient voiding trial following recent ED visit after a fall in which she sustained distal left fibular and distal right ulnar fractures and was found to have urinary retention requiring Foley catheterization.  Patient continues to struggle with limited mobility due to right arm splint and left leg boot.  She is tolerating Foley catheter well and would like to keep it in place pending further improvement in mobility.  I am in agreement with this plan.  We will plan for voiding trial in clinic in approximately 2 weeks.  Of note, patient was prescribed Macrobid by inpatient physician at [facility] for management of UTI with symptoms of lower abdominal pain and gross hematuria.  Patient reports improvement in her symptoms since starting antibiotics.

## 2019-09-28 NOTE — Progress Notes (Signed)
Catheter Removal Patient is present today for a catheter removal.  5 ml of water was drained from the balloon. A 16 FR foley cath was removed from the bladder no complications were noted . Patient tolerated well.  Performed by: Nori Riis, PA-C   Follow up/ Additional notes: Patient will return this afternoon for PVR  PVR is 163 mL.  She will return in one month for PVR.  Daughter, [name], will call if there are any issues in the mean time.

## 2019-09-29 ENCOUNTER — Ambulatory Visit: Payer: Medicare PPO | Admitting: Urology

## 2019-09-29 ENCOUNTER — Encounter: Payer: Self-pay | Admitting: Urology

## 2019-09-29 ENCOUNTER — Other Ambulatory Visit: Payer: Self-pay

## 2019-09-29 VITALS — BP 132/77 | HR 83 | Ht 66.0 in | Wt 170.0 lb

## 2019-09-29 DIAGNOSIS — R339 Retention of urine, unspecified: Secondary | ICD-10-CM | POA: Diagnosis not present

## 2019-09-29 LAB — BLADDER SCAN AMB NON-IMAGING: Scan Result: 163

## 2019-09-30 ENCOUNTER — Ambulatory Visit: Payer: Self-pay | Admitting: Physician Assistant

## 2019-09-30 ENCOUNTER — Other Ambulatory Visit: Payer: Self-pay | Admitting: Family Medicine

## 2019-10-27 ENCOUNTER — Ambulatory Visit: Payer: Medicare PPO | Admitting: Urology

## 2019-11-10 ENCOUNTER — Other Ambulatory Visit: Payer: Self-pay

## 2019-11-10 ENCOUNTER — Emergency Department: Payer: Medicare PPO

## 2019-11-10 ENCOUNTER — Emergency Department
Admission: EM | Admit: 2019-11-10 | Discharge: 2019-11-10 | Disposition: A | Discharge status: Home / Self Care | Payer: Medicare PPO | Attending: Student in an Organized Health Care Education/Training Program | Admitting: Student in an Organized Health Care Education/Training Program

## 2019-11-10 ENCOUNTER — Encounter: Payer: Self-pay | Admitting: Emergency Medicine

## 2019-11-10 DIAGNOSIS — I10 Essential (primary) hypertension: Secondary | ICD-10-CM | POA: Insufficient documentation

## 2019-11-10 DIAGNOSIS — Z7982 Long term (current) use of aspirin: Secondary | ICD-10-CM | POA: Diagnosis not present

## 2019-11-10 DIAGNOSIS — S0990XA Unspecified injury of head, initial encounter: Secondary | ICD-10-CM | POA: Insufficient documentation

## 2019-11-10 DIAGNOSIS — Y999 Unspecified external cause status: Secondary | ICD-10-CM | POA: Insufficient documentation

## 2019-11-10 DIAGNOSIS — Z79899 Other long term (current) drug therapy: Secondary | ICD-10-CM | POA: Diagnosis not present

## 2019-11-10 DIAGNOSIS — Y939 Activity, unspecified: Secondary | ICD-10-CM | POA: Insufficient documentation

## 2019-11-10 DIAGNOSIS — W06XXXA Fall from bed, initial encounter: Secondary | ICD-10-CM | POA: Diagnosis not present

## 2019-11-10 DIAGNOSIS — E039 Hypothyroidism, unspecified: Secondary | ICD-10-CM | POA: Insufficient documentation

## 2019-11-10 DIAGNOSIS — Y929 Unspecified place or not applicable: Secondary | ICD-10-CM | POA: Insufficient documentation

## 2019-11-10 DIAGNOSIS — W19XXXA Unspecified fall, initial encounter: Secondary | ICD-10-CM

## 2019-11-10 DIAGNOSIS — Z853 Personal history of malignant neoplasm of breast: Secondary | ICD-10-CM | POA: Insufficient documentation

## 2019-11-10 DIAGNOSIS — S8011XA Contusion of right lower leg, initial encounter: Secondary | ICD-10-CM | POA: Insufficient documentation

## 2019-11-10 DIAGNOSIS — Z96643 Presence of artificial hip joint, bilateral: Secondary | ICD-10-CM | POA: Diagnosis not present

## 2019-11-10 NOTE — ED Triage Notes (Signed)
Pt from an assisted living, she does not know name of. Pt states she fell out of bed this morning. No obvious injury noted. Pt states she did strike her posterior scalp. Pt denies complaints.

## 2019-11-10 NOTE — ED Notes (Signed)
Patient to lobby via wheelchair by EMS.  Patient from [facility].  Patient had unwitnessed fall.  Reports pain to right side of head, right shoulder and pain to both legs below the knees.  EMS vitals - hr 61,bp 149/79, pulse oxi 97% on room air, temp 97.4.

## 2019-11-10 NOTE — ED Provider Notes (Signed)
[facility] Emergency Department Provider Note  ____________________________________________   First MD Initiated Contact with Patient 11/10/19 1110     (approximate)  I have reviewed the triage vital signs and the nursing notes.   HISTORY  Chief Complaint Fall    HPI Destiny Neal is a 86 y.o. female presents emergency department from [facility] assisted living.  States she rolled out of bed and hit her head.  All information was received via the nursing staff at [facility].  Her daughter also states that her mother has rolled out of the bed and fallen several times while living there since [facility].  The patient has been ambulatory to the bathroom and back with the daughter's help per the daughter    Past Medical History:  Diagnosis Date   Arthritis    hips   Breast cancer ([place])    Depression    Hypertension    Hypothyroidism    Migraines    none in 8-10 yrs   Wears hearing aid    bilateral    Patient Active Problem List   Diagnosis Date Noted   Essential hypertension, benign 03/07/2018   Cerebrovascular accident (CVA) due to occlusion of left posterior cerebral artery ([place]) 03/07/2018   Fracture of femoral neck, right ([place]) 03/07/2018   Pernicious anemia 03/07/2018   Acquired hypothyroidism 03/07/2018   Dyslipidemia 03/07/2018   Depression, major, single episode, mild ([place]) 03/07/2018   Peripheral neuropathy 03/07/2018   Glaucoma 03/07/2018   Hip fracture ([place]) 03/01/2018    Past Surgical History:  Procedure Laterality Date   BREAST LUMPECTOMY     CATARACT EXTRACTION W/PHACO Left 06/21/2015   Procedure: CATARACT EXTRACTION PHACO AND INTRAOCULAR LENS PLACEMENT ([place]);  Surgeon: Ronnell Freshwater, MD;  Location: [place];  Service: Ophthalmology;  Laterality: Left;   CATARACT EXTRACTION W/PHACO Right 07/12/2015   Procedure: CATARACT EXTRACTION PHACO AND INTRAOCULAR LENS PLACEMENT ([place])  right;  Surgeon: Ronnell Freshwater, MD;  Location: [place];  Service: Ophthalmology;  Laterality: Right;   HIP ARTHROPLASTY Right 03/01/2018   Procedure: ARTHROPLASTY BIPOLAR HIP (HEMIARTHROPLASTY);  Surgeon: Corky Mull, MD;  Location: [facility];  Service: Orthopedics;  Laterality: Right;   JOINT REPLACEMENT Left    total hip    Prior to Admission medications   Medication Sig Start Date End Date Taking? Authorizing Provider  acetaminophen (TYLENOL) 325 MG tablet Take 500 mg by mouth every 8 (eight) hours as needed. for mild to moderate pain or fever >100.5 03/19/18   [provider]  amLODipine (NORVASC) 5 MG tablet Take 1 tablet (5 mg total) by mouth daily. 07/05/16   Epifanio Lesches, MD  aspirin 81 MG tablet Take 81 mg by mouth daily.    [provider]  Cholecalciferol (VITAMIN D3) 50 MCG (2000 UT) TABS Take 4,000 Units by mouth daily at 12 noon.    [provider]  clopidogrel (PLAVIX) 75 MG tablet Take 1 tablet (75 mg total) by mouth daily. 07/05/16   Epifanio Lesches, MD  cyanocobalamin (,VITAMIN B-12,) 1000 MCG/ML injection Inject 1 mL into the muscle every 30 (thirty) days. 02/23/18   [provider]  diclofenac Sodium (VOLTAREN) 1 % GEL Apply topically 2 (two) times daily as needed.    [provider]  diphenhydrAMINE (GERI-DRYL) 25 MG tablet Take 25 mg by mouth 2 (two) times daily as needed.    [provider]  docusate sodium (COLACE) 100 MG capsule Take 100 mg by  mouth 2 (two) times daily.    [provider]  dorzolamide-timolol (COSOPT) 22.3-6.8 MG/ML ophthalmic solution Place 1 drop into the left eye 2 (two) times daily.     [provider]  gabapentin (NEURONTIN) 100 MG capsule Take 100 mg by mouth 2 (two) times daily as needed.  02/25/18   [provider]  hydrALAZINE (APRESOLINE) 25 MG tablet Take 25 mg by mouth every 12 (twelve) hours. 03/05/18   [provider]    latanoprost (XALATAN) 0.005 % ophthalmic solution Place 1 drop into both eyes at bedtime. glaucoma- wait 5 minutes between multiple drops    [provider]  Lidocaine HCl (ASPERCREME LIDOCAINE) 4 % CREA Apply topically.    [provider]  meclizine (ANTIVERT) 12.5 MG tablet Take 12.5 mg by mouth 3 (three) times daily as needed for dizziness.    [provider]  melatonin 5 MG TABS Take 5 mg by mouth at bedtime.    [provider]  Multiple Vitamin (MULTIVITAMIN) capsule Take 1 capsule by mouth daily.    [provider]  NON FORMULARY Diet Type: Regular    [provider]  PARoxetine (PAXIL) 10 MG tablet Take 10 mg by mouth daily.     [provider]  rosuvastatin (CRESTOR) 5 MG tablet Take 1 tablet (5 mg total) by mouth daily at 6 PM. 07/05/16   Epifanio Lesches, MD  SALINE NASAL SPRAY NA Place 2 sprays into both nostrils as needed. 03/07/18   [provider]  traZODone (DESYREL) 50 MG tablet Take 50 mg by mouth at bedtime. 08/09/19   [provider]  vitamin B-12 (CYANOCOBALAMIN) 1000 MCG tablet Take 1,000 mcg by mouth daily.    [provider]    Allergies Lincocin [lincomycin hcl], Metronidazole, Codeine, Chlorzoxazone, Doxycycline, Hydrochlorothiazide, Hydrocodone, Oxycodone, Penicillins, Tramadol, Betadine [povidone iodine], and Iodine  No family history on file.  Social History Social History   Tobacco Use   Smoking status: Never Smoker   Smokeless tobacco: Never Used  Substance Use Topics   Alcohol use: No   Drug use: Never    Review of Systems limited by patient's dementia  Constitutional: No fever/chills Eyes: No visual changes. ENT: No sore throat. Respiratory: Denies cough Cardiovascular: Denies chest pain Gastrointestinal: Denies abdominal pain Genitourinary: Negative for dysuria. Musculoskeletal: Negative for back pain. Skin: Negative for rash. Psychiatric: no mood  changes,     ____________________________________________   PHYSICAL EXAM:  VITAL SIGNS: ED Triage Vitals [11/10/19 0657]  Enc Vitals Group     BP 108/85     Pulse Rate 62     Resp 14     Temp 98 F (36.7 C)     Temp Source Oral     SpO2 94 %     Weight 160 lb (72.6 kg)     Height 5\' 6"  (1.676 m)     Head Circumference      [facility] Flow      Pain Score 0     Pain Loc      Pain Edu?      Excl. in [place]?     Constitutional: Alert and oriented. Well appearing and in no acute distress. Eyes: Conjunctivae are normal.  Head: Atraumatic. Nose: No congestion/rhinnorhea. Mouth/Throat: Mucous membranes are moist.   Neck:  supple no lymphadenopathy noted Cardiovascular: Normal rate, regular rhythm. Heart sounds are normal Respiratory: Normal respiratory effort.  No retractions, lungs c t a  Abd: soft nontender bs normal all  4 quad GU: deferred Musculoskeletal: FROM all extremities, warm and well perfused right tib-fib is little tender to palpation Neurologic:  Normal speech and language.  Skin:  Skin is warm, dry and intact.  No bruising noted no rash noted. Psychiatric: Mood and affect are normal. Speech and behavior are normal.  ____________________________________________   LABS (all labs ordered are listed, but only abnormal results are displayed)  Labs Reviewed - No data to display ____________________________________________   ____________________________________________  RADIOLOGY  CT of the head and C-spine are both negative for any acute abnormality X-ray of the right tib-fib is negative for fracture  ____________________________________________   PROCEDURES  Procedure(s) performed: No  Procedures    ____________________________________________   INITIAL IMPRESSION / ASSESSMENT AND PLAN / ED COURSE  Pertinent labs & imaging results that were available during my care of the patient were reviewed by me and considered in my medical decision making (see  chart for details).   Patient is 86 year old female presents after fall at nursing home.  See HPI.  Physical exam is unremarkable and very limited due to the patient's dementia.  Right lower extremity is tender to palpation along the bony prominences.  CT the head and C-spine are negative for acute abnormality, x-ray of the right lower extremity is negative for fracture  The patient's daughter was notified of the results.  She will be transporting the patient home but needed to go switch cars.  Patient be discharged stable condition.     Destiny Neal was evaluated in Emergency Department on 11/10/2019 for the symptoms described in the history of present illness. She was evaluated in the context of the global COVID-19 pandemic, which necessitated consideration that the patient might be at risk for infection with the SARS-CoV-2 virus that causes COVID-19. Institutional protocols and algorithms that pertain to the evaluation of patients at risk for COVID-19 are in a state of rapid change based on information released by regulatory bodies including the CDC and federal and state organizations. These policies and algorithms were followed during the patient's care in the ED.    As part of my medical decision making, I reviewed the following data within the [place] History obtained from family, Nursing notes reviewed and incorporated, Old chart reviewed, Radiograph reviewed , Notes from prior ED visits and [place] Controlled Substance Database  ____________________________________________   FINAL CLINICAL IMPRESSION(S) / ED DIAGNOSES  Final diagnoses:  Fall, initial encounter  Minor head injury, initial encounter  Contusion of multiple sites of right lower extremity, initial encounter      NEW MEDICATIONS STARTED DURING THIS VISIT:  New Prescriptions   No medications on file     Note:  This document was prepared using Dragon voice recognition software and may include  unintentional dictation errors.    Versie Starks, PA-C 11/10/19 1210    Merlyn Lot, MD 11/10/19 [id number]

## 2019-11-10 NOTE — ED Notes (Signed)
See triage note   Per daughter fell from bed  Lives at [facility] rolled out of bed  Hit her head  Denies any pain at present

## 2019-12-05 ENCOUNTER — Emergency Department

## 2019-12-05 ENCOUNTER — Emergency Department
Admission: EM | Admit: 2019-12-05 | Discharge: 2019-12-05 | Disposition: A | Discharge status: Home / Self Care | Attending: Emergency Medicine | Admitting: Emergency Medicine

## 2019-12-05 ENCOUNTER — Encounter: Payer: Self-pay | Admitting: Emergency Medicine

## 2019-12-05 ENCOUNTER — Other Ambulatory Visit: Payer: Self-pay

## 2019-12-05 DIAGNOSIS — Y92002 Bathroom of unspecified non-institutional (private) residence single-family (private) house as the place of occurrence of the external cause: Secondary | ICD-10-CM | POA: Insufficient documentation

## 2019-12-05 DIAGNOSIS — Y999 Unspecified external cause status: Secondary | ICD-10-CM | POA: Diagnosis not present

## 2019-12-05 DIAGNOSIS — W1849XA Other slipping, tripping and stumbling without falling, initial encounter: Secondary | ICD-10-CM | POA: Insufficient documentation

## 2019-12-05 DIAGNOSIS — Z7982 Long term (current) use of aspirin: Secondary | ICD-10-CM | POA: Insufficient documentation

## 2019-12-05 DIAGNOSIS — Y939 Activity, unspecified: Secondary | ICD-10-CM | POA: Diagnosis not present

## 2019-12-05 DIAGNOSIS — Z96642 Presence of left artificial hip joint: Secondary | ICD-10-CM | POA: Insufficient documentation

## 2019-12-05 DIAGNOSIS — S0990XA Unspecified injury of head, initial encounter: Secondary | ICD-10-CM | POA: Diagnosis not present

## 2019-12-05 DIAGNOSIS — I10 Essential (primary) hypertension: Secondary | ICD-10-CM | POA: Diagnosis not present

## 2019-12-05 DIAGNOSIS — R296 Repeated falls: Secondary | ICD-10-CM

## 2019-12-05 DIAGNOSIS — Z853 Personal history of malignant neoplasm of breast: Secondary | ICD-10-CM | POA: Insufficient documentation

## 2019-12-05 DIAGNOSIS — E039 Hypothyroidism, unspecified: Secondary | ICD-10-CM | POA: Insufficient documentation

## 2019-12-05 DIAGNOSIS — Z79899 Other long term (current) drug therapy: Secondary | ICD-10-CM | POA: Diagnosis not present

## 2019-12-05 DIAGNOSIS — S300XXA Contusion of lower back and pelvis, initial encounter: Secondary | ICD-10-CM | POA: Diagnosis present

## 2019-12-05 NOTE — ED Provider Notes (Signed)
[facility] Emergency Department Provider Note   ____________________________________________   First MD Initiated Contact with Patient 12/05/19 0940     (approximate)  I have reviewed the triage vital signs and the nursing notes.   HISTORY  Chief Complaint No chief complaint on file.   HPI SUMIYE HIRTH is a 86 y.o. female patient is brought to the ED via EMS from [facility] after a fall this morning.  Reportedly patient slipped coming out of the bathroom.  Patient states that she believes she hit her head but denies any LOC.  She also complains of her buttocks hurting.  She denies any nausea, vomiting or visual changes.  She rates her pain as 3 out of 10.     Past Medical History:  Diagnosis Date  . Arthritis    hips  . Breast cancer ([place])   . Depression   . Hypertension   . Hypothyroidism   . Migraines    none in 8-10 yrs  . Wears hearing aid    bilateral    Patient Active Problem List   Diagnosis Date Noted  . Essential hypertension, benign 03/07/2018  . Cerebrovascular accident (CVA) due to occlusion of left posterior cerebral artery ([place]) 03/07/2018  . Fracture of femoral neck, right ([place]) 03/07/2018  . Pernicious anemia 03/07/2018  . Acquired hypothyroidism 03/07/2018  . Dyslipidemia 03/07/2018  . Depression, major, single episode, mild ([place]) 03/07/2018  . Peripheral neuropathy 03/07/2018  . Glaucoma 03/07/2018  . Hip fracture ([place]) 03/01/2018    Past Surgical History:  Procedure Laterality Date  . BREAST LUMPECTOMY    . CATARACT EXTRACTION W/PHACO Left 06/21/2015   Procedure: CATARACT EXTRACTION PHACO AND INTRAOCULAR LENS PLACEMENT (IOC);  Surgeon: Ronnell Freshwater, MD;  Location: [place];  Service: Ophthalmology;  Laterality: Left;  . CATARACT EXTRACTION W/PHACO Right 07/12/2015   Procedure: CATARACT EXTRACTION PHACO AND INTRAOCULAR LENS PLACEMENT ([place]) right;  Surgeon: Ronnell Freshwater, MD;   Location: [place];  Service: Ophthalmology;  Laterality: Right;  . HIP ARTHROPLASTY Right 03/01/2018   Procedure: ARTHROPLASTY BIPOLAR HIP (HEMIARTHROPLASTY);  Surgeon: Corky Mull, MD;  Location: [facility];  Service: Orthopedics;  Laterality: Right;  . JOINT REPLACEMENT Left    total hip    Prior to Admission medications   Medication Sig Start Date End Date Taking? Authorizing Provider  acetaminophen (TYLENOL) 325 MG tablet Take 500 mg by mouth every 8 (eight) hours as needed. for mild to moderate pain or fever >100.5 03/19/18   [provider]  amLODipine (NORVASC) 5 MG tablet Take 1 tablet (5 mg total) by mouth daily. 07/05/16   Epifanio Lesches, MD  aspirin 81 MG tablet Take 81 mg by mouth daily.    [provider]  Cholecalciferol (VITAMIN D3) 50 MCG (2000 UT) TABS Take 4,000 Units by mouth daily at 12 noon.    [provider]  clopidogrel (PLAVIX) 75 MG tablet Take 1 tablet (75 mg total) by mouth daily. 07/05/16   Epifanio Lesches, MD  cyanocobalamin (,VITAMIN B-12,) 1000 MCG/ML injection Inject 1 mL into the muscle every 30 (thirty) days. 02/23/18   [provider]  diclofenac Sodium (VOLTAREN) 1 % GEL Apply topically 2 (two) times daily as needed.    [provider]  diphenhydrAMINE (GERI-DRYL) 25 MG tablet Take 25 mg by mouth 2 (two) times daily as needed.    [provider]  docusate sodium (COLACE) 100 MG capsule Take 100 mg by mouth 2 (  two) times daily.    [provider]  dorzolamide-timolol (COSOPT) 22.3-6.8 MG/ML ophthalmic solution Place 1 drop into the left eye 2 (two) times daily.     [provider]  gabapentin (NEURONTIN) 100 MG capsule Take 100 mg by mouth 2 (two) times daily as needed.  02/25/18   [provider]  hydrALAZINE (APRESOLINE) 25 MG tablet Take 25 mg by mouth every 12 (twelve) hours. 03/05/18   [provider]  latanoprost (XALATAN) 0.005 % ophthalmic  solution Place 1 drop into both eyes at bedtime. glaucoma- wait 5 minutes between multiple drops    [provider]  Lidocaine HCl (ASPERCREME LIDOCAINE) 4 % CREA Apply topically.    [provider]  meclizine (ANTIVERT) 12.5 MG tablet Take 12.5 mg by mouth 3 (three) times daily as needed for dizziness.    [provider]  melatonin 5 MG TABS Take 5 mg by mouth at bedtime.    [provider]  Multiple Vitamin (MULTIVITAMIN) capsule Take 1 capsule by mouth daily.    [provider]  NON FORMULARY Diet Type: Regular    [provider]  PARoxetine (PAXIL) 10 MG tablet Take 10 mg by mouth daily.     [provider]  rosuvastatin (CRESTOR) 5 MG tablet Take 1 tablet (5 mg total) by mouth daily at 6 PM. 07/05/16   Epifanio Lesches, MD  SALINE NASAL SPRAY NA Place 2 sprays into both nostrils as needed. 03/07/18   [provider]  traZODone (DESYREL) 50 MG tablet Take 50 mg by mouth at bedtime. 08/09/19   [provider]  vitamin B-12 (CYANOCOBALAMIN) 1000 MCG tablet Take 1,000 mcg by mouth daily.    [provider]    Allergies Lincocin [lincomycin hcl], Metronidazole, Codeine, Chlorzoxazone, Doxycycline, Hydrochlorothiazide, Hydrocodone, Oxycodone, Penicillins, Tramadol, Betadine [povidone iodine], and Iodine  No family history on file.  Social History Social History   Tobacco Use  . Smoking status: Never Smoker  . Smokeless tobacco: Never Used  Substance Use Topics  . Alcohol use: No  . Drug use: Never    Review of Systems Constitutional: No fever/chills Eyes: No visual changes. ENT: No trauma. Cardiovascular: Denies chest pain. Respiratory: Denies shortness of breath. Gastrointestinal: No abdominal pain.  No nausea, no vomiting.  Genitourinary: Negative for dysuria. Musculoskeletal: Positive for pain bilateral buttocks. Skin: Negative for rash. Neurological: Negative for headaches, focal  weakness or numbness.  ____________________________________________   PHYSICAL EXAM:  VITAL SIGNS: ED Triage Vitals  Enc Vitals Group     BP 12/05/19 0718 110/78     Pulse Rate 12/05/19 0718 78     Resp 12/05/19 0718 18     Temp 12/05/19 0718 (!) 97.4 F (36.3 C)     Temp Source 12/05/19 0718 Oral     SpO2 12/05/19 0718 97 %     Weight 12/05/19 0716 160 lb 0.9 oz (72.6 kg)     Height 12/05/19 0716 5\' 6"  (1.676 m)     Head Circumference --      Peak Flow --      Pain Score 12/05/19 0715 3     Pain Loc --      Pain Edu? --      Excl. in [place]? --    Constitutional: Alert and oriented. Well appearing and in no acute distress. Eyes: Conjunctivae are normal. PERRL. EOMI. Head: Atraumatic. Nose: No congestion/rhinnorhea. Mouth/Throat: Mucous membranes are moist.  Oropharynx non-erythematous. Neck: No stridor.  No tenderness noted  on palpation of cervical spine posteriorly. Cardiovascular: Normal rate, regular rhythm. Grossly normal heart sounds.  Good peripheral circulation. Respiratory: Normal respiratory effort.  No retractions. Lungs CTAB. Gastrointestinal: Soft and nontender. No distention.  No bruising is noted.  Bowel sounds normoactive x4 quadrants. Musculoskeletal: Patient is able move upper and lower extremities with any difficulty.  There is no tenderness on palpation of the thoracic or lumbar spine.  Patient is able to flex and extend lower extremities bilateral knees and also abduct and abduct lower extremity. Neurologic:  Normal speech and language. No gross focal neurologic deficits are appreciated.  Skin:  Skin is warm, dry and intact. No rash noted.  No abrasions or bruising is noted. Psychiatric: Mood and affect are normal. Speech and behavior are normal.  ____________________________________________   LABS (all labs ordered are listed, but only abnormal results are displayed)  Labs Reviewed - No data to display  RADIOLOGY   Official radiology report(s): DG  Chest 1 View  Result Date: 12/05/2019 CLINICAL DATA:  Fall and confusion. EXAM: CHEST  1 VIEW COMPARISON:  03/01/2018 prior studies FINDINGS: The cardiomediastinal silhouette is unremarkable. Mild peribronchial thickening again noted. There is no evidence of focal airspace disease, pulmonary edema, suspicious pulmonary nodule/mass, pleural effusion, or pneumothorax. No acute bony abnormalities are identified. IMPRESSION: No active disease. Electronically Signed   By: Margarette Canada M.D.   On: 12/05/2019 08:23   CT Head Wo Contrast  Result Date: 12/05/2019 CLINICAL DATA:  Unwitnessed fall with head injury EXAM: CT HEAD WITHOUT CONTRAST CT CERVICAL SPINE WITHOUT CONTRAST TECHNIQUE: Multidetector CT imaging of the head and cervical spine was performed following the standard protocol without intravenous contrast. Multiplanar CT image reconstructions of the cervical spine were also generated. COMPARISON:  11/10/2019 FINDINGS: CT HEAD FINDINGS Brain: No evidence of acute infarction, hemorrhage, hydrocephalus, extra-axial collection or mass lesion/mass effect. Remote left inferior cerebellar infarct. Remote left caudate infarct. Generalized atrophy. Vascular: No hyperdense vessel or unexpected calcification. Skull: Negative for fracture Sinuses/Orbits: No evidence of injury. Stable minimal sphenoid sinus opacity. CT CERVICAL SPINE FINDINGS Alignment: No traumatic malalignment. Degenerative reversal of cervical lordosis Skull base and vertebrae: Negative for acute fracture Soft tissues and spinal canal: No prevertebral fluid or swelling. No visible canal hematoma. Disc levels: Diffuse disc narrowing and endplate degeneration with posterior endplate ridging greatest at C5-6 where there is likely cord contact. Uncovertebral spurs and disc height loss causes multilevel foraminal narrowing. Multilevel facet osteoarthritis Upper chest: No evidence of injury IMPRESSION: 1. No evidence of intracranial or cervical spine injury. 2.  Chronic findings are stable from studies last month. Electronically Signed   By: Monte Fantasia M.D.   On: 12/05/2019 07:56   CT Cervical Spine Wo Contrast  Result Date: 12/05/2019 CLINICAL DATA:  Unwitnessed fall with head injury EXAM: CT HEAD WITHOUT CONTRAST CT CERVICAL SPINE WITHOUT CONTRAST TECHNIQUE: Multidetector CT imaging of the head and cervical spine was performed following the standard protocol without intravenous contrast. Multiplanar CT image reconstructions of the cervical spine were also generated. COMPARISON:  11/10/2019 FINDINGS: CT HEAD FINDINGS Brain: No evidence of acute infarction, hemorrhage, hydrocephalus, extra-axial collection or mass lesion/mass effect. Remote left inferior cerebellar infarct. Remote left caudate infarct. Generalized atrophy. Vascular: No hyperdense vessel or unexpected calcification. Skull: Negative for fracture Sinuses/Orbits: No evidence of injury. Stable minimal sphenoid sinus opacity. CT CERVICAL SPINE FINDINGS Alignment: No traumatic malalignment. Degenerative reversal of cervical lordosis Skull base and vertebrae: Negative for acute fracture Soft tissues and spinal canal: No prevertebral  fluid or swelling. No visible canal hematoma. Disc levels: Diffuse disc narrowing and endplate degeneration with posterior endplate ridging greatest at C5-6 where there is likely cord contact. Uncovertebral spurs and disc height loss causes multilevel foraminal narrowing. Multilevel facet osteoarthritis Upper chest: No evidence of injury IMPRESSION: 1. No evidence of intracranial or cervical spine injury. 2. Chronic findings are stable from studies last month. Electronically Signed   By: Monte Fantasia M.D.   On: 12/05/2019 07:56   DG Pelvis Portable  Result Date: 12/05/2019 CLINICAL DATA:  Acute pelvic pain following fall today. EXAM: PORTABLE PELVIS 1-2 VIEWS COMPARISON:  09/02/2019 radiograph FINDINGS: Bilateral hip arthroplasties again noted. Bilateral pubic fractures  are unchanged. No acute fracture or dislocation identified. Degenerative changes in the LOWER lumbar spine are again noted. IMPRESSION: No evidence of acute abnormality. Electronically Signed   By: Margarette Canada M.D.   On: 12/05/2019 08:18    ____________________________________________   PROCEDURES  Procedure(s) performed (including Critical Care):  Procedures   ____________________________________________   INITIAL IMPRESSION / ASSESSMENT AND PLAN / ED COURSE  As part of my medical decision making, I reviewed the following data within the electronic MEDICAL RECORD NUMBER Notes from prior ED visits and [place] Controlled Substance [place] was evaluated in Emergency Department on 12/05/2019 for the symptoms described in the history of present illness. She was evaluated in the context of the global COVID-19 pandemic, which necessitated consideration that the patient might be at risk for infection with the SARS-CoV-2 virus that causes COVID-19. Institutional protocols and algorithms that pertain to the evaluation of patients at risk for COVID-19 are in a state of rapid change based on information released by regulatory bodies including the CDC and federal and state organizations. These policies and algorithms were followed during the patient's care in the ED.  86 year old female was brought to the ED via EMS after she states that she slipped in the bathroom and fell.  She denies any LOC but reports that she did hit her head and that her "tailbone" hurts.  She has no other complaints and is able move upper and lower extremities without any difficulty.  CT head and cervical spine was negative for any acute changes.  There was also no bony injury noted on chest x-ray and pelvis.  Patient was made aware.  She states that her daughter is coming to pick her up.  Arrangements were made for her to go back to her facility at [facility].  She is encouraged to follow-up with her PCP if any continued  problems or any concerns.  ____________________________________________   FINAL CLINICAL IMPRESSION(S) / ED DIAGNOSES  Final diagnoses:  Contusion of buttock, initial encounter  Unwitnessed fall     ED Discharge Orders    None       Note:  This document was prepared using Dragon voice recognition software and may include unintentional dictation errors.    Johnn Hai, PA-C 12/05/19 1435    Lucrezia Starch, MD 12/05/19 [phone]

## 2019-12-05 NOTE — ED Triage Notes (Addendum)
Presents vis EMS from [facility]  S/p fall  Per EMS the staff found her on the floor   Unwitnessed fall   Hit her head and buttocks no bleeding or other complaints

## 2019-12-05 NOTE — Discharge Instructions (Addendum)
Follow-up with your primary care provider if any continued problems or concerns.  Take your normal medication as directed by your doctor.  There were no bones broken on your x-rays.  Also the CT of your head and neck were negative for any new injury.  You may use ice if needed for the pain on your buttocks.

## 2019-12-05 NOTE — ED Notes (Signed)
Pt reports was coming out of the bathroom and she slipped and fell. Pt reports hit her head and behind. Denies LOC

## 2020-02-17 ENCOUNTER — Emergency Department
Admission: EM | Admit: 2020-02-17 | Discharge: 2020-02-17 | Disposition: A | Discharge status: Skilled Nursing Facility | Attending: Emergency Medicine | Admitting: Emergency Medicine

## 2020-02-17 ENCOUNTER — Other Ambulatory Visit: Payer: Self-pay

## 2020-02-17 ENCOUNTER — Encounter: Payer: Self-pay | Admitting: *Deleted

## 2020-02-17 ENCOUNTER — Emergency Department

## 2020-02-17 DIAGNOSIS — Z79899 Other long term (current) drug therapy: Secondary | ICD-10-CM | POA: Diagnosis not present

## 2020-02-17 DIAGNOSIS — Z7982 Long term (current) use of aspirin: Secondary | ICD-10-CM | POA: Insufficient documentation

## 2020-02-17 DIAGNOSIS — I10 Essential (primary) hypertension: Secondary | ICD-10-CM | POA: Diagnosis not present

## 2020-02-17 DIAGNOSIS — Z96643 Presence of artificial hip joint, bilateral: Secondary | ICD-10-CM | POA: Diagnosis not present

## 2020-02-17 DIAGNOSIS — W182XXA Fall in (into) shower or empty bathtub, initial encounter: Secondary | ICD-10-CM | POA: Insufficient documentation

## 2020-02-17 DIAGNOSIS — E039 Hypothyroidism, unspecified: Secondary | ICD-10-CM | POA: Insufficient documentation

## 2020-02-17 DIAGNOSIS — Y92002 Bathroom of unspecified non-institutional (private) residence single-family (private) house as the place of occurrence of the external cause: Secondary | ICD-10-CM | POA: Diagnosis not present

## 2020-02-17 DIAGNOSIS — S51011A Laceration without foreign body of right elbow, initial encounter: Secondary | ICD-10-CM | POA: Insufficient documentation

## 2020-02-17 DIAGNOSIS — Z853 Personal history of malignant neoplasm of breast: Secondary | ICD-10-CM | POA: Insufficient documentation

## 2020-02-17 DIAGNOSIS — W19XXXA Unspecified fall, initial encounter: Secondary | ICD-10-CM

## 2020-02-17 DIAGNOSIS — Z9012 Acquired absence of left breast and nipple: Secondary | ICD-10-CM | POA: Diagnosis not present

## 2020-02-17 DIAGNOSIS — R339 Retention of urine, unspecified: Secondary | ICD-10-CM

## 2020-02-17 DIAGNOSIS — S59901A Unspecified injury of right elbow, initial encounter: Secondary | ICD-10-CM | POA: Diagnosis present

## 2020-02-17 DIAGNOSIS — S0990XA Unspecified injury of head, initial encounter: Secondary | ICD-10-CM | POA: Diagnosis not present

## 2020-02-17 LAB — CBC WITH DIFFERENTIAL/PLATELET
Abs Immature Granulocytes: 0.13 10*3/uL — ABNORMAL HIGH (ref 0.00–0.07)
Basophils Absolute: 0 10*3/uL (ref 0.0–0.1)
Basophils Relative: 0 %
Eosinophils Absolute: 0.1 10*3/uL (ref 0.0–0.5)
Eosinophils Relative: 1 %
HCT: 46.3 % — ABNORMAL HIGH (ref 36.0–46.0)
Hemoglobin: 15.8 g/dL — ABNORMAL HIGH (ref 12.0–15.0)
Immature Granulocytes: 1 %
Lymphocytes Relative: 11 %
Lymphs Abs: 1.1 10*3/uL (ref 0.7–4.0)
MCH: 31.1 pg (ref 26.0–34.0)
MCHC: 34.1 g/dL (ref 30.0–36.0)
MCV: 91.1 fL (ref 80.0–100.0)
Monocytes Absolute: 0.6 10*3/uL (ref 0.1–1.0)
Monocytes Relative: 6 %
Neutro Abs: 7.8 10*3/uL — ABNORMAL HIGH (ref 1.7–7.7)
Neutrophils Relative %: 81 %
Platelets: 218 10*3/uL (ref 150–400)
RBC: 5.08 MIL/uL (ref 3.87–5.11)
RDW: 12.5 % (ref 11.5–15.5)
WBC: 9.7 10*3/uL (ref 4.0–10.5)
nRBC: 0 % (ref 0.0–0.2)

## 2020-02-17 LAB — BASIC METABOLIC PANEL
Anion gap: 11 (ref 5–15)
BUN: 12 mg/dL (ref 8–23)
CO2: 26 mmol/L (ref 22–32)
Calcium: 9.4 mg/dL (ref 8.9–10.3)
Chloride: 99 mmol/L (ref 98–111)
Creatinine, Ser: 0.75 mg/dL (ref 0.44–1.00)
GFR, Estimated: >60 mL/min (ref >60)
Glucose, Bld: 124 mg/dL — ABNORMAL HIGH (ref 70–99)
Potassium: 3.5 mmol/L (ref 3.5–5.1)
Sodium: 136 mmol/L (ref 135–145)

## 2020-02-17 LAB — URINALYSIS, COMPLETE (UACMP) WITH MICROSCOPIC
Bacteria, UA: NONE SEEN
Bilirubin Urine: NEGATIVE
Glucose, UA: NEGATIVE mg/dL
Hgb urine dipstick: NEGATIVE
Ketones, ur: NEGATIVE mg/dL
Leukocytes,Ua: NEGATIVE
Nitrite: NEGATIVE
Protein, ur: NEGATIVE mg/dL
Specific Gravity, Urine: 1.009 (ref 1.005–1.030)
pH: 7 (ref 5.0–8.0)

## 2020-02-17 MED ORDER — ACETAMINOPHEN 500 MG PO TABS
1000.0000 mg | ORAL_TABLET | Freq: Once | ORAL | Status: AC
  Administered 2020-02-17: 1000 mg via ORAL
  Filled 2020-02-17: qty 2

## 2020-02-17 MED ORDER — BACITRACIN ZINC 500 UNIT/GM EX OINT
TOPICAL_OINTMENT | Freq: Once | CUTANEOUS | Status: AC
  Administered 2020-02-17: 1 via TOPICAL
  Filled 2020-02-17: qty 1.8

## 2020-02-17 NOTE — ED Notes (Signed)
Lab called for blood draw, as 2 unsuccessful attempts by this RN already

## 2020-02-17 NOTE — ED Notes (Signed)
purewick applied to pt

## 2020-02-17 NOTE — ED Notes (Signed)
[facility]  CALLED  FOR  TRANSPORT  BACK  TO  [facility]

## 2020-02-17 NOTE — ED Triage Notes (Signed)
Pt to ED via EMS from [facility] after an unwitnessed fall. Pt reports she woke to go to the bathroom and slipped. Pt reporting generalized body pain with a skin tear to her right elbow. Bruising to her knees and pain in her right hip and neck. No obvious deformities noted upon arrival.   Pt is alert and oriented to time, situation, place, and self.

## 2020-02-17 NOTE — ED Provider Notes (Signed)
[facility] Emergency Department Provider Note ____________________________________________   First MD Initiated Contact with Patient 02/17/20 [id number]     (approximate)  I have reviewed the triage vital signs and the nursing notes.  HISTORY  Chief Complaint Fall   HPI Destiny Neal is a 86 y.o. femalewho presents to the ED for evaluation of fall.  Chart review indicates HTN, HLD, DAPT on ASA and Plavix. Patient resides at a local SNF, [facility].  Patient reportedly awoke this morning to void, ambulating independently to the restroom and had an unwitnessed fall.  She indicates that she does not know why she fell and, she fell to the ground landing on her right elbow causing a skin tear.  Handoff from EMS and facility indicate no concerns for seizure-like activity or syncope.  No known incontinence.   She initially reports mild diffuse pain that is 4/10 intensity, and when I asked her again she indicates that she is in no pain.  She has no complaints and says she feels normal.   Past Medical History:  Diagnosis Date  . Arthritis    hips  . Breast cancer ([place])   . Depression   . Hypertension   . Hypothyroidism   . Migraines    none in 8-10 yrs  . Wears hearing aid    bilateral    Patient Active Problem List   Diagnosis Date Noted  . Essential hypertension, benign 03/07/2018  . Cerebrovascular accident (CVA) due to occlusion of left posterior cerebral artery ([place]) 03/07/2018  . Fracture of femoral neck, right ([place]) 03/07/2018  . Pernicious anemia 03/07/2018  . Acquired hypothyroidism 03/07/2018  . Dyslipidemia 03/07/2018  . Depression, major, single episode, mild ([place]) 03/07/2018  . Peripheral neuropathy 03/07/2018  . Glaucoma 03/07/2018  . Hip fracture ([place]) 03/01/2018    Past Surgical History:  Procedure Laterality Date  . BREAST LUMPECTOMY    . CATARACT EXTRACTION W/PHACO Left 06/21/2015   Procedure: CATARACT EXTRACTION PHACO AND  INTRAOCULAR LENS PLACEMENT (IOC);  Surgeon: Ronnell Freshwater, MD;  Location: [facility];  Service: Ophthalmology;  Laterality: Left;  . CATARACT EXTRACTION W/PHACO Right 07/12/2015   Procedure: CATARACT EXTRACTION PHACO AND INTRAOCULAR LENS PLACEMENT ([place]) right;  Surgeon: Ronnell Freshwater, MD;  Location: [facility];  Service: Ophthalmology;  Laterality: Right;  . HIP ARTHROPLASTY Right 03/01/2018   Procedure: ARTHROPLASTY BIPOLAR HIP (HEMIARTHROPLASTY);  Surgeon: Corky Mull, MD;  Location: [facility];  Service: Orthopedics;  Laterality: Right;  . JOINT REPLACEMENT Left    total hip    Prior to Admission medications   Medication Sig Start Date End Date Taking? Authorizing Provider  acetaminophen (TYLENOL) 325 MG tablet Take 500 mg by mouth every 8 (eight) hours as needed. for mild to moderate pain or fever >100.5 03/19/18   [provider]  amLODipine (NORVASC) 5 MG tablet Take 1 tablet (5 mg total) by mouth daily. 07/05/16   Epifanio Lesches, MD  aspirin 81 MG tablet Take 81 mg by mouth daily.    [provider]  Cholecalciferol (VITAMIN D3) 50 MCG (2000 UT) TABS Take 4,000 Units by mouth daily at 12 noon.    [provider]  clopidogrel (PLAVIX) 75 MG tablet Take 1 tablet (75 mg total) by mouth daily. 07/05/16   Epifanio Lesches, MD  cyanocobalamin (,VITAMIN B-12,) 1000 MCG/ML injection Inject 1 mL into the muscle every 30 (thirty) days. 02/23/18   [provider]  diclofenac Sodium (VOLTAREN) 1 % GEL  Apply topically 2 (two) times daily as needed.    [provider]  diphenhydrAMINE (GERI-DRYL) 25 MG tablet Take 25 mg by mouth 2 (two) times daily as needed.    [provider]  docusate sodium (COLACE) 100 MG capsule Take 100 mg by mouth 2 (two) times daily.    [provider]  dorzolamide-timolol (COSOPT) 22.3-6.8 MG/ML ophthalmic solution Place 1 drop into the left eye 2 (two) times daily.      [provider]  gabapentin (NEURONTIN) 100 MG capsule Take 100 mg by mouth 2 (two) times daily as needed.  02/25/18   [provider]  hydrALAZINE (APRESOLINE) 25 MG tablet Take 25 mg by mouth every 12 (twelve) hours. 03/05/18   [provider]  latanoprost (XALATAN) 0.005 % ophthalmic solution Place 1 drop into both eyes at bedtime. glaucoma- wait 5 minutes between multiple drops    [provider]  Lidocaine HCl (ASPERCREME LIDOCAINE) 4 % CREA Apply topically.    [provider]  meclizine (ANTIVERT) 12.5 MG tablet Take 12.5 mg by mouth 3 (three) times daily as needed for dizziness.    [provider]  melatonin 5 MG TABS Take 5 mg by mouth at bedtime.    [provider]  Multiple Vitamin (MULTIVITAMIN) capsule Take 1 capsule by mouth daily.    [provider]  NON FORMULARY Diet Type: Regular    [provider]  PARoxetine (PAXIL) 10 MG tablet Take 10 mg by mouth daily.     [provider]  rosuvastatin (CRESTOR) 5 MG tablet Take 1 tablet (5 mg total) by mouth daily at 6 PM. 07/05/16   Epifanio Lesches, MD  SALINE NASAL SPRAY NA Place 2 sprays into both nostrils as needed. 03/07/18   [provider]  traZODone (DESYREL) 50 MG tablet Take 50 mg by mouth at bedtime. 08/09/19   [provider]  vitamin B-12 (CYANOCOBALAMIN) 1000 MCG tablet Take 1,000 mcg by mouth daily.    [provider]    Allergies Lincocin [lincomycin hcl], Metronidazole, Codeine, Chlorzoxazone, Doxycycline, Hydrochlorothiazide, Hydrocodone, Oxycodone, Penicillins, Tramadol, Betadine [povidone iodine], and Iodine  History reviewed. No pertinent family history.  Social History Social History   Tobacco Use  . Smoking status: Never Smoker  . Smokeless tobacco: Never Used  Substance Use Topics  . Alcohol use: No  . Drug use: Never    Review of Systems  Unable to be accurately assessed due to patient's  baseline dementia and disorientation ____________________________________________   PHYSICAL EXAM:  VITAL SIGNS: Vitals:   02/17/20 0624  BP: (!) 174/85  Pulse: 76  Resp: 12  Temp: 97.8 F (36.6 C)  SpO2: 96%     Constitutional: Alert and pleasantly disoriented. Well appearing and in no acute distress.  Conversational in full sentences. Eyes: Conjunctivae are normal. PERRL. EOMI. Head: Atraumatic. Nose: No congestion/rhinnorhea. Mouth/Throat: Mucous membranes are moist.  Oropharynx non-erythematous. Neck: No stridor. No cervical spine tenderness to palpation. Cardiovascular: Normal rate, regular rhythm. Grossly normal heart sounds.  Good peripheral circulation. Respiratory: Normal respiratory effort.  No retractions. Lungs CTAB. Gastrointestinal: Soft , nondistended, nontender to palpation. No CVA tenderness. Musculoskeletal: No lower extremity tenderness nor edema.  No joint effusions.  Skin tear overlying her right-sided olecranon that is hemostatic from EMS dressing.  Full active and passive ROM of the right elbow without pain or bleeding.  RUE is distally neurovascularly intact. Full palpation of all 4 extremities otherwise without evidence of deformity, tenderness or acute  traumatic pathology. Neurologic:  Normal speech and language. No gross focal neurologic deficits are appreciated.  Cranial nerves II through XII intact 5/5 strength and sensation in all 4 extremities Skin:  Skin is warm, dry and intact. No rash noted. Psychiatric: Mood and affect are normal. Speech and behavior are normal.  ____________________________________________   LABS (all labs ordered are listed, but only abnormal results are displayed)  Labs Reviewed  CBC WITH DIFFERENTIAL/PLATELET  BASIC METABOLIC PANEL  URINALYSIS, COMPLETE (UACMP) WITH MICROSCOPIC   ____________________________________________  12 Lead EKG  Sinus rhythm, rate of 78 bpm.  Normal axis and intervals.  No evidence of  acute ischemia. ____________________________________________  RADIOLOGY  ED MD interpretation: CT head reviewed by me without evidence of acute intracranial pathology.  Official radiology report(s): CT Head Wo Contrast  Result Date: 02/17/2020 CLINICAL DATA:  Minor head trauma EXAM: CT HEAD WITHOUT CONTRAST TECHNIQUE: Contiguous axial images were obtained from the base of the skull through the vertex without intravenous contrast. COMPARISON:  12/05/2019 FINDINGS: Brain: No evidence of acute infarction, hemorrhage, hydrocephalus, extra-axial collection or mass lesion/mass effect. Remote left PICA territory and left basal ganglia infarcts. Cerebral volume loss which is generalized. Mild ischemic gliosis in the periventricular white matter. Vascular: No hyperdense vessel or unexpected calcification. Skull: Normal. Negative for fracture or focal lesion. Sinuses/Orbits: No acute finding. IMPRESSION: 1. No evidence of intracranial injury. 2. Brain atrophy and chronic infarcts. Electronically Signed   By: Monte Fantasia M.D.   On: 02/17/2020 06:47    ____________________________________________   PROCEDURES and INTERVENTIONS  Procedure(s) performed (including Critical Care):  .1-3 Lead EKG Interpretation Performed by: Vladimir Crofts, MD Authorized by: Vladimir Crofts, MD     Interpretation: normal     ECG rate:  74   ECG rate assessment: normal     Rhythm: sinus rhythm     Ectopy: none     Conduction: normal      Medications  acetaminophen (TYLENOL) tablet 1,000 mg (1,000 mg Oral Given 02/17/20 0634)  bacitracin ointment (1 application Topical Given 02/17/20 8850)    ____________________________________________   MDM / ED COURSE   Pleasantly demented 86 year old woman presents from local SNF after an unwitnessed fall, skin tear to her right elbow, but otherwise without signs of acute traumatic pathology.  Mild hypertension, otherwise normal vitals on room air.  Exam demonstrates an  isolated skin tear to her right elbow, but she has intact passive and active ROM without pain suggestive of no bony injury.  She otherwise has no evidence of injuries to her extremities, back or torso.  She is neurologically intact without distress.  CT head without evidence of acute intracranial pathology.  EKG is nonischemic with normal intervals, without evidence of a cardiac source of syncope.  Blood work and plain films of right elbow is pending at the time of signout to oncoming provider.  I anticipate outpatient management as long as there are no surprising features on this imaging and blood work.  Signed out to oncoming provider to follow-up on these studies.      ____________________________________________   FINAL CLINICAL IMPRESSION(S) / ED DIAGNOSES  Final diagnoses:  Fall, initial encounter  Skin tear of elbow without complication, right, initial encounter     ED Discharge Orders    None       Destiny Neal   Note:  This document was prepared using Dragon voice recognition software and may include unintentional dictation errors.   Vladimir Crofts, MD 02/17/20 [phone]

## 2020-02-17 NOTE — ED Notes (Signed)
Pt transported to CT ?

## 2020-02-17 NOTE — ED Notes (Signed)
This RN was getting pt ready for discharge, daughter to take home. When pt sat on the side of the bed, she c/o dizziness and generalized weakness. VS taken as charted, stable. Pt continued to ask to lie down due to the dizziness. MD made aware, will continue to monitor a little longer, and try again.

## 2020-02-17 NOTE — ED Provider Notes (Signed)
-----------------------------------------   7:02 AM on 02/17/2020 -----------------------------------------  Blood pressure (!) 173/86, pulse 75, temperature 97.8 F (36.6 C), temperature source Oral, resp. rate 14, height 5\' 6"  (1.676 m), weight 72.6 kg, SpO2 98 %.  Assuming care from Dr. Tamala Julian.  In short, Destiny Neal is a 86 y.o. female with a chief complaint of Fall .  Refer to the original H&P for additional details.  The current plan of care is to follow-up labs and imaging following unwitnessed fall.  ----------------------------------------- 12:05 PM on 02/17/2020 -----------------------------------------  CT head and x-ray of elbow are negative for acute injury.  Patient has been unable to provide any urine with pure wick in place, on my assessment she had some suprapubic fullness with associated tenderness.  Bladder scan showed greater than 500 cc of urine consistent with urinary retention and Foley catheter was placed.  She had output of clear yellow urine and UA shows no signs of infection.  She is appropriate for discharge back to nursing facility with urology follow-up.  Patient and daughter agree with plan.    Blake Divine, MD 02/17/20 [phone]

## 2020-02-24 ENCOUNTER — Ambulatory Visit: Payer: Medicare PPO | Admitting: Urology

## 2020-02-24 ENCOUNTER — Other Ambulatory Visit
Admission: RE | Admit: 2020-02-24 | Discharge: 2020-02-24 | Disposition: A | Discharge status: Home / Self Care | Source: Ambulatory Visit | Attending: Urology | Admitting: Urology

## 2020-02-24 ENCOUNTER — Other Ambulatory Visit: Payer: Self-pay

## 2020-02-24 ENCOUNTER — Telehealth: Payer: Self-pay

## 2020-02-24 ENCOUNTER — Ambulatory Visit: Admitting: Urology

## 2020-02-24 ENCOUNTER — Encounter: Payer: Self-pay | Admitting: Urology

## 2020-02-24 VITALS — BP 128/74 | HR 83 | Ht 66.0 in

## 2020-02-24 DIAGNOSIS — R339 Retention of urine, unspecified: Secondary | ICD-10-CM

## 2020-02-24 DIAGNOSIS — R3989 Other symptoms and signs involving the genitourinary system: Secondary | ICD-10-CM

## 2020-02-24 LAB — URINALYSIS, COMPLETE (UACMP) WITH MICROSCOPIC
Glucose, UA: NEGATIVE mg/dL
Ketones, ur: 15 mg/dL — AB
Nitrite: POSITIVE — AB
Protein, ur: >300 mg/dL — AB
RBC / HPF: >50 RBC/hpf (ref 0–5)
Specific Gravity, Urine: 1.02 (ref 1.005–1.030)
WBC, UA: >50 WBC/hpf (ref 0–5)
pH: 5.5 (ref 5.0–8.0)

## 2020-02-24 LAB — BLADDER SCAN AMB NON-IMAGING

## 2020-02-24 MED ORDER — SULFAMETHOXAZOLE-TRIMETHOPRIM 800-160 MG PO TABS: 1.0000 | ORAL_TABLET | Freq: Two times a day (BID) | ORAL | 0 refills | Status: AC

## 2020-02-24 NOTE — Telephone Encounter (Signed)
-----   Message from Billey Co, MD sent at 02/24/2020  1:23 PM EST ----- UA very concerning for infection.  Please start Bactrim DS twice daily x3 days for suspected acute UTI, will call with culture results  Nickolas Madrid, MD 02/24/2020

## 2020-02-24 NOTE — Telephone Encounter (Signed)
Called pt's daughter per DPR informed her of the information below. Daughter gave verbal understanding. Rx sent in.

## 2020-02-24 NOTE — Progress Notes (Signed)
   02/24/2020 8:40 AM   [name] 05-May-1920 [id number]  Reason for visit: Follow up urinary retention, suspected UTI  HPI: I saw Ms. Quesnel in urology clinic today for urinary retention.  She is a 86 year old female who was originally seen by urology in June 2021 when the ER was unable to place a catheter with a bladder scan of over 1 L.  She then passed a follow-up void trial at the end of June.  She recently presented to the ED on 11/16 after a fall, and was found to have a bladder scan of 500 mL and the catheter was placed. UA was benign at that visit and showed no evidence of infection.  She had another fall last night, and has also been more confused the last 48 hours. She is very somnolent today and minimally conversational. Most of the history is obtained from the daughter. Urine in the catheter is yellow and very cloudy with debris, no hematuria.  Urinalysis today concerning for infection with greater than 50 WBCs, greater than 50 RBCs, many bacteria, large leukocytes, nitrite positive.  Will send for culture.  In the setting of suspected UTI, as well as her somnolence and frailty, I recommended keeping the catheter in for the time being, and treating her suspected UTI with antibiotics, with rescheduled void trial in 1 to 2 weeks.  We discussed the risks of sepsis at the catheter is removed acutely in the setting of infection with incomplete bladder emptying, and family is in agreement.  Bactrim DS twice daily x3 days for suspected acute UTI, follow-up culture results Re-schedule voiding trial for 1 to 2 weeks when clinically improved  Billey Co, MD  [facility] [address] [place], [place] [address] [phone]

## 2020-02-24 NOTE — Patient Instructions (Signed)
We will call with your urine results

## 2020-02-27 LAB — URINE CULTURE: Culture: >=100000 — AB

## 2020-02-29 NOTE — Progress Notes (Signed)
03/01/2020 2:51 PM   Destiny Neal Jul 25, 1920 [id number]  Referring provider: [facility] [place],  [place] [address]  Chief Complaint  Patient presents with  . Follow-up    voiding trial    HPI: Destiny Neal is a 86 y.o. female with urinary retention and UTI's who presents today for a voiding trial with her daughter, [name].    Urinary retention Retention in 10/2019 - PVR 1 L  Retention in 02/2020 - PVR 500 mL  Catheter removed this am for TOV.  She has drank two of the large hospital jugs of water and tea at lunch.  She has voided on her own and has had incontinent episodes.    PVR this afternoon is 214 mL.    UTI's + E.coli pan sensitive 02/24/2020 - Bactrim DS x 14 days  PMH: Past Medical History:  Diagnosis Date  . Arthritis    hips  . Breast cancer ([place])   . Depression   . Hypertension   . Hypothyroidism   . Migraines    none in 8-10 yrs  . Wears hearing aid    bilateral    Surgical History: Past Surgical History:  Procedure Laterality Date  . BREAST LUMPECTOMY    . CATARACT EXTRACTION W/PHACO Left 06/21/2015   Procedure: CATARACT EXTRACTION PHACO AND INTRAOCULAR LENS PLACEMENT (IOC);  Surgeon: Ronnell Freshwater, MD;  Location: [facility];  Service: Ophthalmology;  Laterality: Left;  . CATARACT EXTRACTION W/PHACO Right 07/12/2015   Procedure: CATARACT EXTRACTION PHACO AND INTRAOCULAR LENS PLACEMENT ([place]) right;  Surgeon: Ronnell Freshwater, MD;  Location: [facility];  Service: Ophthalmology;  Laterality: Right;  . HIP ARTHROPLASTY Right 03/01/2018   Procedure: ARTHROPLASTY BIPOLAR HIP (HEMIARTHROPLASTY);  Surgeon: Corky Mull, MD;  Location: [facility];  Service: Orthopedics;  Laterality: Right;  . JOINT REPLACEMENT Left    total hip    Home Medications:  Allergies as of 03/01/2020      Reactions   Lincocin [lincomycin Hcl] Swelling   angioedema   Metronidazole  Itching   severe   Codeine    Severe headache   Chlorzoxazone Other (See Comments)   Unspecified   Doxycycline    Hydrochlorothiazide Other (See Comments)   Hydrocodone Nausea Only, Other (See Comments)   fainted   Oxycodone Other (See Comments)   Sleep walks   Penicillins Swelling   Tramadol Nausea And Vomiting   Betadine [povidone Iodine] Rash   Itching   Iodine Rash   itching      Medication List       Accurate as of March 01, 2020  2:51 PM. If you have any questions, ask your nurse or doctor.        acetaminophen 325 MG tablet Commonly known as: TYLENOL Take 500 mg by mouth every 8 (eight) hours as needed. for mild to moderate pain or fever >100.5   amLODipine 5 MG tablet Commonly known as: NORVASC Take 1 tablet (5 mg total) by mouth daily.   Aspercreme Lidocaine 4 % Crea Generic drug: Lidocaine HCl Apply topically.   Aspirin Low Dose 81 MG EC tablet Generic drug: aspirin Take 81 mg by mouth daily.   clopidogrel 75 MG tablet Commonly known as: PLAVIX Take 1 tablet (75 mg total) by mouth daily.   diclofenac Sodium 1 % Gel Commonly known as: VOLTAREN Apply topically 2 (two) times daily as needed.   docusate sodium 100 MG capsule Commonly known  as: COLACE Take 100 mg by mouth 2 (two) times daily.   dorzolamide-timolol 22.3-6.8 MG/ML ophthalmic solution Commonly known as: COSOPT Place 1 drop into the left eye 2 (two) times daily.   gabapentin 100 MG capsule Commonly known as: NEURONTIN Take 100 mg by mouth 2 (two) times daily as needed.   Geri-Dryl 25 MG tablet Generic drug: diphenhydrAMINE Take 25 mg by mouth 2 (two) times daily as needed.   hydrALAZINE 25 MG tablet Commonly known as: APRESOLINE Take 25 mg by mouth every 12 (twelve) hours.   latanoprost 0.005 % ophthalmic solution Commonly known as: XALATAN Place 1 drop into both eyes at bedtime. glaucoma- wait 5 minutes between multiple drops   meclizine 12.5 MG tablet Commonly known  as: ANTIVERT Take 12.5 mg by mouth 3 (three) times daily as needed for dizziness.   melatonin 5 MG Tabs Take 5 mg by mouth at bedtime.   multivitamin capsule Take 1 capsule by mouth daily.   Myrbetriq 25 MG Tb24 tablet Generic drug: mirabegron ER Take 25 mg by mouth daily.   NON FORMULARY Diet Type: Regular   ondansetron 4 MG tablet Commonly known as: ZOFRAN Take by mouth.   PARoxetine 20 MG tablet Commonly known as: PAXIL Take 20 mg by mouth daily.   rosuvastatin 5 MG tablet Commonly known as: CRESTOR Take 1 tablet (5 mg total) by mouth daily at 6 PM.   SALINE NASAL SPRAY NA Place 2 sprays into both nostrils as needed.   simethicone 125 MG chewable tablet Commonly known as: MYLICON Chew by mouth.   sulfamethoxazole-trimethoprim 800-160 MG tablet Commonly known as: BACTRIM DS Take 1 tablet by mouth every 12 (twelve) hours. Started by: Zara Council, PA-C   traZODone 50 MG tablet Commonly known as: DESYREL Take 50 mg by mouth at bedtime.   vitamin B-12 1000 MCG tablet Commonly known as: CYANOCOBALAMIN Take 1,000 mcg by mouth daily.   Vitamin D3 50 MCG (2000 UT) Tabs Take 4,000 Units by mouth daily at 12 noon.       Allergies:  Allergies  Allergen Reactions  . Lincocin [Lincomycin Hcl] Swelling    angioedema  . Metronidazole Itching    severe  . Codeine     Severe headache  . Chlorzoxazone Other (See Comments)    Unspecified  . Doxycycline   . Hydrochlorothiazide Other (See Comments)  . Hydrocodone Nausea Only and Other (See Comments)    fainted  . Oxycodone Other (See Comments)    Sleep walks  . Penicillins Swelling  . Tramadol Nausea And Vomiting  . Betadine [Povidone Iodine] Rash    Itching  . Iodine Rash    itching    Family History: No family history on file.  Social History:  reports that she has never smoked. She has never used smokeless tobacco. She reports that she does not drink alcohol and does not use  drugs.  ROS: Pertinent ROS in HPI  Physical Exam: BP 127/78   Pulse 70   Ht 5\' 6"  (1.676 m)   Wt 170 lb (77.1 kg)   BMI 27.44 kg/m   Constitutional:  Well nourished. Alert and oriented, No acute distress. HEENT: [place] AT, mask in place.  Trachea midline Cardiovascular: No clubbing, cyanosis, or edema. Respiratory: Normal respiratory effort, no increased work of breathing. Neurologic: Grossly intact, no focal deficits, moving all 4 extremities. Psychiatric: Normal mood and affect.   Laboratory Data: Lab Results  Component Value Date   WBC 9.7 02/17/2020   HGB  15.8 (H) 02/17/2020   HCT 46.3 (H) 02/17/2020   MCV 91.1 02/17/2020   PLT 218 02/17/2020    Lab Results  Component Value Date   CREATININE 0.75 02/17/2020    Lab Results  Component Value Date   HGBA1C 5.8 (H) 07/03/2016       Component Value Date/Time   CHOL 190 07/03/2016 0407   HDL 63 07/03/2016 0407   CHOLHDL 3.0 07/03/2016 0407   VLDL 7 07/03/2016 0407   LDLCALC 120 (H) 07/03/2016 0407    Lab Results  Component Value Date   AST 26 03/01/2018   Lab Results  Component Value Date   ALT 18 03/01/2018    Urinalysis    Component Value Date/Time   COLORURINE AMBER (A) 02/24/2020 1145   APPEARANCEUR CLOUDY (A) 02/24/2020 1145   APPEARANCEUR Clear 05/27/2011 1243   LABSPEC 1.020 02/24/2020 1145   LABSPEC 1.006 05/27/2011 1243   PHURINE 5.5 02/24/2020 1145   GLUCOSEU NEGATIVE 02/24/2020 1145   GLUCOSEU Negative 05/27/2011 1243   HGBUR LARGE (A) 02/24/2020 1145   BILIRUBINUR MODERATE (A) 02/24/2020 1145   BILIRUBINUR Negative 05/27/2011 1243   KETONESUR 15 (A) 02/24/2020 1145   PROTEINUR >300 (A) 02/24/2020 1145   NITRITE POSITIVE (A) 02/24/2020 1145   LEUKOCYTESUR LARGE (A) 02/24/2020 1145   LEUKOCYTESUR Negative 05/27/2011 1243    I have reviewed the labs.  Catheter Removal  Patient is present today for a catheter removal.  15 ml of water was drained from the balloon. A 14 FR foley cath  was removed from the bladder no complications were noted . Patient tolerated well.  Performed by: Zara Council, PA-C   Pertinent Imaging: Results for LEGNA, MAUSOLF (MRN [id number]) as of 03/01/2020 14:48  Ref. Range 03/01/2020 14:33  Scan Result Unknown 214 ml    Assessment & Plan:    1. Urinary retention:    - foley catheter removed - voiding trial today   - PVR this afternoon is less than 300, but we will have her return in one week for PVR to keep a close eye on her - return sooner if unable to urinate or experiencing suprapubic discomfort  2. UTI - continue Bactrim    Return in about 1 week (around 03/08/2020) for PVR.  These notes generated with voice recognition software. I apologize for typographical errors.  Zara Council, PA-C  [facility] [address]  [place], [place] [address] [phone]  I spent 15 minutes on the day of the encounter to include pre-visit record review, face-to-face time with the patient, and post-visit ordering of tests.

## 2020-03-01 ENCOUNTER — Ambulatory Visit: Payer: Medicare Other | Admitting: Urology

## 2020-03-01 ENCOUNTER — Telehealth: Payer: Self-pay

## 2020-03-01 ENCOUNTER — Encounter: Payer: Self-pay | Admitting: Urology

## 2020-03-01 ENCOUNTER — Ambulatory Visit: Payer: Medicare PPO | Admitting: Urology

## 2020-03-01 VITALS — BP 127/78 | HR 70 | Ht 66.0 in | Wt 170.0 lb

## 2020-03-01 DIAGNOSIS — N3001 Acute cystitis with hematuria: Secondary | ICD-10-CM

## 2020-03-01 DIAGNOSIS — R339 Retention of urine, unspecified: Secondary | ICD-10-CM | POA: Diagnosis not present

## 2020-03-01 LAB — BLADDER SCAN AMB NON-IMAGING: Scan Result: 214

## 2020-03-01 MED ORDER — SULFAMETHOXAZOLE-TRIMETHOPRIM 800-160 MG PO TABS: 1.0000 | ORAL_TABLET | Freq: Two times a day (BID) | ORAL | 0 refills | Status: AC

## 2020-03-01 NOTE — Telephone Encounter (Signed)
-----   Message from Billey Co, MD sent at 02/27/2020  1:23 PM EST ----- Her urine culture grew out E. coli, sensitive to the Bactrim.  Please add Bactrim DS daily x14 days while the catheter is in to prevent recurrent infection, and follow-up as scheduled  Nickolas Madrid, MD 02/27/2020

## 2020-03-01 NOTE — Telephone Encounter (Signed)
Antibiotics sent in by Larene Beach at pt's appointment today.

## 2020-03-07 NOTE — Progress Notes (Signed)
03/08/2020 4:46 PM   Destiny Neal Jan 07, 1921 [id number]  Referring provider: [facility] [place],  [place] [address]  Chief Complaint  Patient presents with  . Follow-up    urinary retention    HPI: Destiny Neal is a 86 y.o. female with urinary retention and UTI's who presents today for a PVR with her daughter, [name].    Urinary retention Retention in 10/2019 - PVR 1 L  Retention in 02/2020 - PVR 500 mL Today's PVR is 29 mL  UTI's + E.coli pan sensitive 02/24/2020 - Bactrim DS x 14 days  PMH: Past Medical History:  Diagnosis Date  . Arthritis    hips  . Breast cancer ([place])   . Depression   . Hypertension   . Hypothyroidism   . Migraines    none in 8-10 yrs  . Wears hearing aid    bilateral    Surgical History: Past Surgical History:  Procedure Laterality Date  . BREAST LUMPECTOMY    . CATARACT EXTRACTION W/PHACO Left 06/21/2015   Procedure: CATARACT EXTRACTION PHACO AND INTRAOCULAR LENS PLACEMENT (IOC);  Surgeon: Ronnell Freshwater, MD;  Location: [facility];  Service: Ophthalmology;  Laterality: Left;  . CATARACT EXTRACTION W/PHACO Right 07/12/2015   Procedure: CATARACT EXTRACTION PHACO AND INTRAOCULAR LENS PLACEMENT ([place]) right;  Surgeon: Ronnell Freshwater, MD;  Location: [facility];  Service: Ophthalmology;  Laterality: Right;  . HIP ARTHROPLASTY Right 03/01/2018   Procedure: ARTHROPLASTY BIPOLAR HIP (HEMIARTHROPLASTY);  Surgeon: Corky Mull, MD;  Location: [facility];  Service: Orthopedics;  Laterality: Right;  . JOINT REPLACEMENT Left    total hip    Home Medications:  Allergies as of 03/08/2020      Reactions   Lincocin [lincomycin Hcl] Swelling   angioedema   Metronidazole Itching   severe   Codeine    Severe headache   Chlorzoxazone Other (See Comments)   Unspecified   Doxycycline    Hydrochlorothiazide Other (See Comments)   Hydrocodone Nausea Only, Other  (See Comments)   fainted   Oxycodone Other (See Comments)   Sleep walks   Penicillins Swelling   Tramadol Nausea And Vomiting   Betadine [povidone Iodine] Rash   Itching   Iodine Rash   itching      Medication List       Accurate as of March 08, 2020 11:59 PM. If you have any questions, ask your nurse or doctor.        acetaminophen 325 MG tablet Commonly known as: TYLENOL Take 500 mg by mouth every 8 (eight) hours as needed. for mild to moderate pain or fever >100.5   amLODipine 5 MG tablet Commonly known as: NORVASC Take 1 tablet (5 mg total) by mouth daily.   Aspercreme Lidocaine 4 % Crea Generic drug: Lidocaine HCl Apply topically.   Aspirin Low Dose 81 MG EC tablet Generic drug: aspirin Take 81 mg by mouth daily.   clopidogrel 75 MG tablet Commonly known as: PLAVIX Take 1 tablet (75 mg total) by mouth daily.   diclofenac Sodium 1 % Gel Commonly known as: VOLTAREN Apply topically 2 (two) times daily as needed.   docusate sodium 100 MG capsule Commonly known as: COLACE Take 100 mg by mouth 2 (two) times daily.   dorzolamide-timolol 22.3-6.8 MG/ML ophthalmic solution Commonly known as: COSOPT Place 1 drop into the left eye 2 (two) times daily.   gabapentin 100 MG capsule Commonly known as: NEURONTIN  Take 100 mg by mouth 2 (two) times daily as needed.   Geri-Dryl 25 MG tablet Generic drug: diphenhydrAMINE Take 25 mg by mouth 2 (two) times daily as needed.   hydrALAZINE 25 MG tablet Commonly known as: APRESOLINE Take 25 mg by mouth every 12 (twelve) hours.   latanoprost 0.005 % ophthalmic solution Commonly known as: XALATAN Place 1 drop into both eyes at bedtime. glaucoma- wait 5 minutes between multiple drops   meclizine 12.5 MG tablet Commonly known as: ANTIVERT Take 12.5 mg by mouth 3 (three) times daily as needed for dizziness.   melatonin 5 MG Tabs Take 5 mg by mouth at bedtime.   multivitamin capsule Take 1 capsule by mouth daily.    Myrbetriq 25 MG Tb24 tablet Generic drug: mirabegron ER Take 25 mg by mouth daily.   NON FORMULARY Diet Type: Regular   ondansetron 4 MG tablet Commonly known as: ZOFRAN Take by mouth.   PARoxetine 20 MG tablet Commonly known as: PAXIL Take 20 mg by mouth daily.   rosuvastatin 5 MG tablet Commonly known as: CRESTOR Take 1 tablet (5 mg total) by mouth daily at 6 PM.   SALINE NASAL SPRAY NA Place 2 sprays into both nostrils as needed.   simethicone 125 MG chewable tablet Commonly known as: MYLICON Chew by mouth.   sulfamethoxazole-trimethoprim 800-160 MG tablet Commonly known as: BACTRIM DS Take 1 tablet by mouth every 12 (twelve) hours.   traZODone 50 MG tablet Commonly known as: DESYREL Take 50 mg by mouth at bedtime.   vitamin B-12 1000 MCG tablet Commonly known as: CYANOCOBALAMIN Take 1,000 mcg by mouth daily.   Vitamin D3 50 MCG (2000 UT) Tabs Take 4,000 Units by mouth daily at 12 noon.       Allergies:  Allergies  Allergen Reactions  . Lincocin [Lincomycin Hcl] Swelling    angioedema  . Metronidazole Itching    severe  . Codeine     Severe headache  . Chlorzoxazone Other (See Comments)    Unspecified  . Doxycycline   . Hydrochlorothiazide Other (See Comments)  . Hydrocodone Nausea Only and Other (See Comments)    fainted  . Oxycodone Other (See Comments)    Sleep walks  . Penicillins Swelling  . Tramadol Nausea And Vomiting  . Betadine [Povidone Iodine] Rash    Itching  . Iodine Rash    itching    Family History: No family history on file.  Social History:  reports that she has never smoked. She has never used smokeless tobacco. She reports that she does not drink alcohol and does not use drugs.  ROS: Pertinent ROS in HPI  Physical Exam: BP 133/77   Pulse (!) 103   Ht 5\' 6"  (1.676 m)   Wt 167 lb (75.8 kg)   BMI 26.95 kg/m   Constitutional:  Well nourished. Alert and oriented, No acute distress. HEENT: [place] AT, mask in place.   Trachea midline Cardiovascular: No clubbing, cyanosis, or edema. Respiratory: Normal respiratory effort, no increased work of breathing. Neurologic: Grossly intact, no focal deficits, moving all 4 extremities. Psychiatric: Normal mood and affect.   Laboratory Data: Lab Results  Component Value Date   WBC 9.7 02/17/2020   HGB 15.8 (H) 02/17/2020   HCT 46.3 (H) 02/17/2020   MCV 91.1 02/17/2020   PLT 218 02/17/2020    Lab Results  Component Value Date   CREATININE 0.75 02/17/2020    Lab Results  Component Value Date   HGBA1C 5.8 (H)  07/03/2016       Component Value Date/Time   CHOL 190 07/03/2016 0407   HDL 63 07/03/2016 0407   CHOLHDL 3.0 07/03/2016 0407   VLDL 7 07/03/2016 0407   LDLCALC 120 (H) 07/03/2016 0407    Lab Results  Component Value Date   AST 26 03/01/2018   Lab Results  Component Value Date   ALT 18 03/01/2018  I have reviewed the labs.   Pertinent Imaging: Results for GLORIAN, MCDONELL (MRN [id number]) as of 03/08/2020 11:31  Ref. Range 03/08/2020 11:19  Scan Result Unknown 29 ml   Assessment & Plan:    1. Urinary retention:    - Resolved  2. UTI - complete Bactrim    Return if symptoms worsen or fail to improve.  These notes generated with voice recognition software. I apologize for typographical errors.  Zara Council, PA-C  [facility] [address]  [place], [place] [address] [phone]

## 2020-03-08 ENCOUNTER — Ambulatory Visit (INDEPENDENT_AMBULATORY_CARE_PROVIDER_SITE_OTHER): Payer: Medicare Other | Admitting: Urology

## 2020-03-08 ENCOUNTER — Other Ambulatory Visit: Payer: Self-pay | Admitting: *Deleted

## 2020-03-08 ENCOUNTER — Other Ambulatory Visit: Payer: Self-pay

## 2020-03-08 ENCOUNTER — Other Ambulatory Visit
Admission: RE | Admit: 2020-03-08 | Discharge: 2020-03-08 | Disposition: A | Discharge status: Home / Self Care | Payer: Medicare PPO | Attending: Urology | Admitting: Urology

## 2020-03-08 ENCOUNTER — Encounter: Payer: Self-pay | Admitting: Urology

## 2020-03-08 VITALS — BP 133/77 | HR 103 | Ht 66.0 in | Wt 167.0 lb

## 2020-03-08 DIAGNOSIS — R339 Retention of urine, unspecified: Secondary | ICD-10-CM

## 2020-03-08 LAB — BLADDER SCAN AMB NON-IMAGING: Scan Result: 29

## 2020-11-25 IMAGING — CR DG ELBOW COMPLETE 3+V*R*
1 series · 4 of 4 positions shown · non-contrast
Comparison: None.

CLINICAL DATA: Fall with skin laceration

EXAM:
RIGHT ELBOW - COMPLETE 3+ VIEW

[Series 1: dg elbow complete right (3+view) · 0.14mm/px · 4 of 4 slices shown]
[im 1/4]
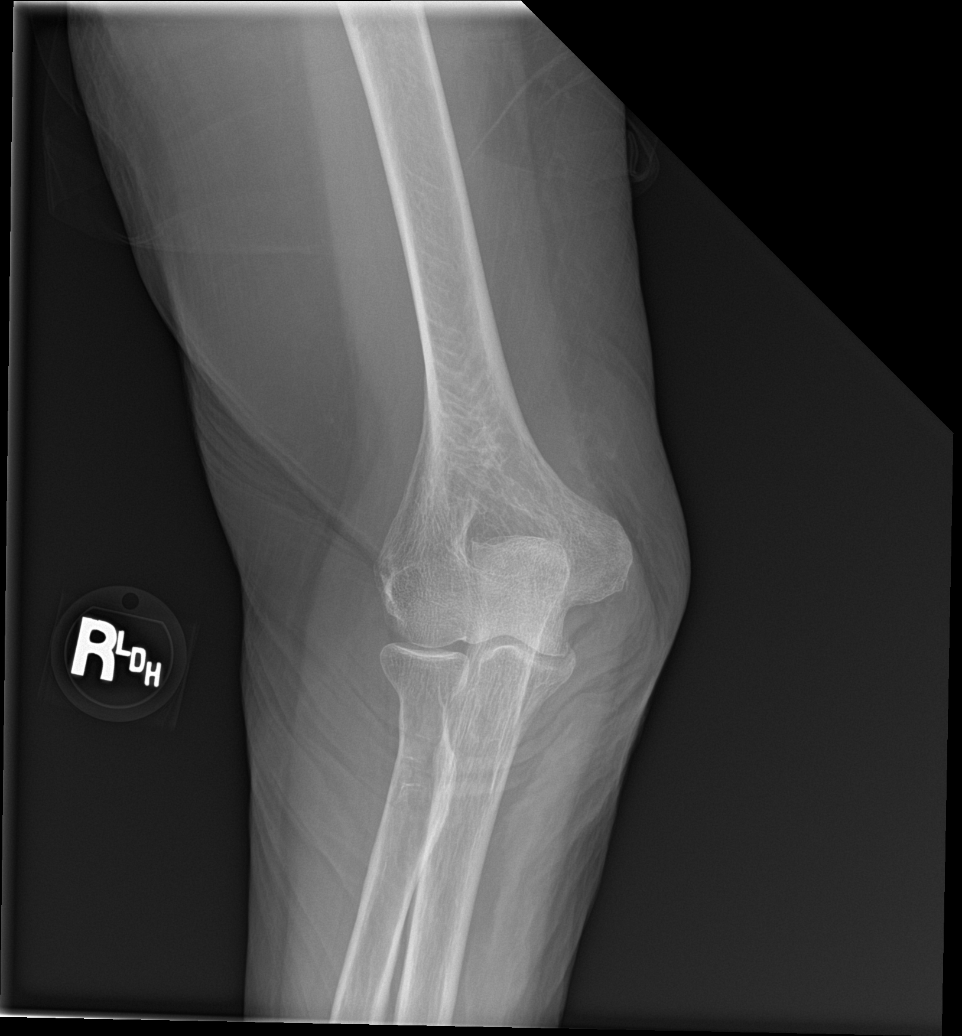
[im 2/4]
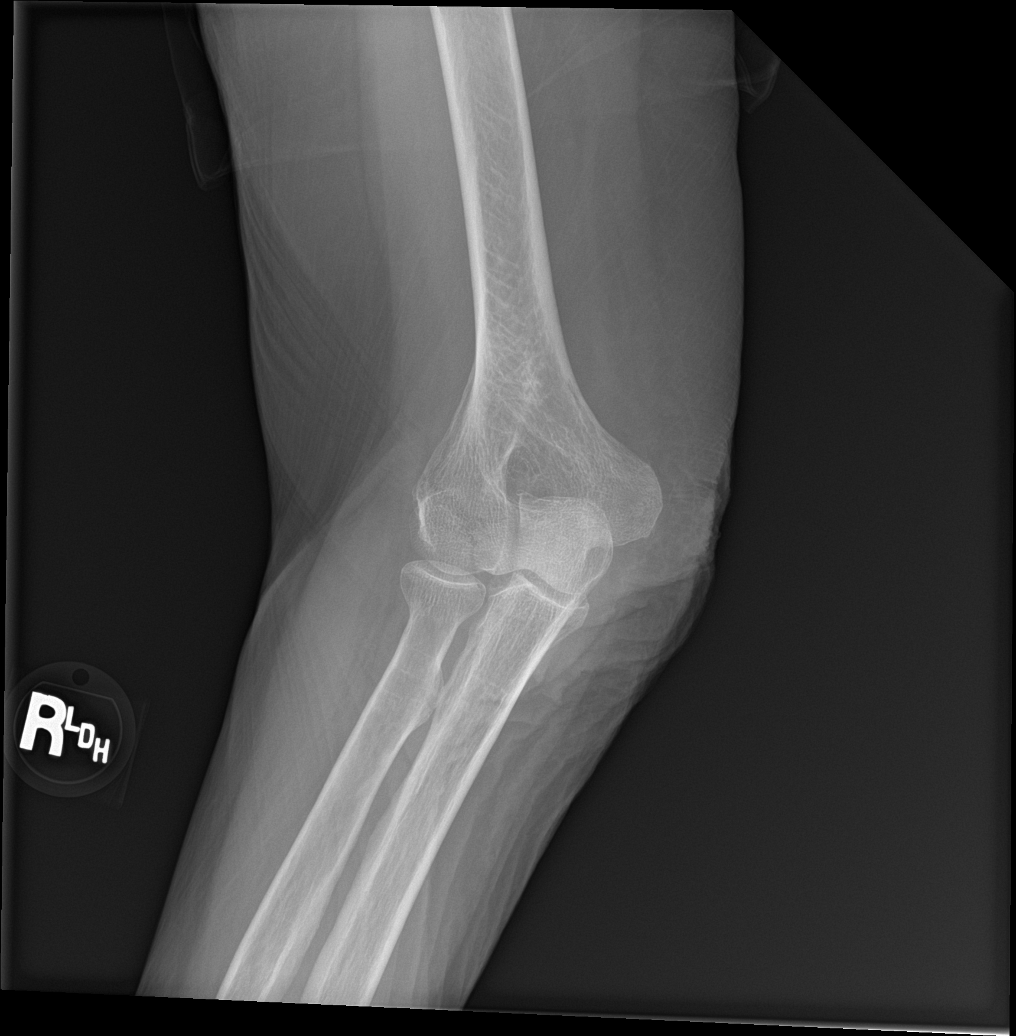
[im 3/4]
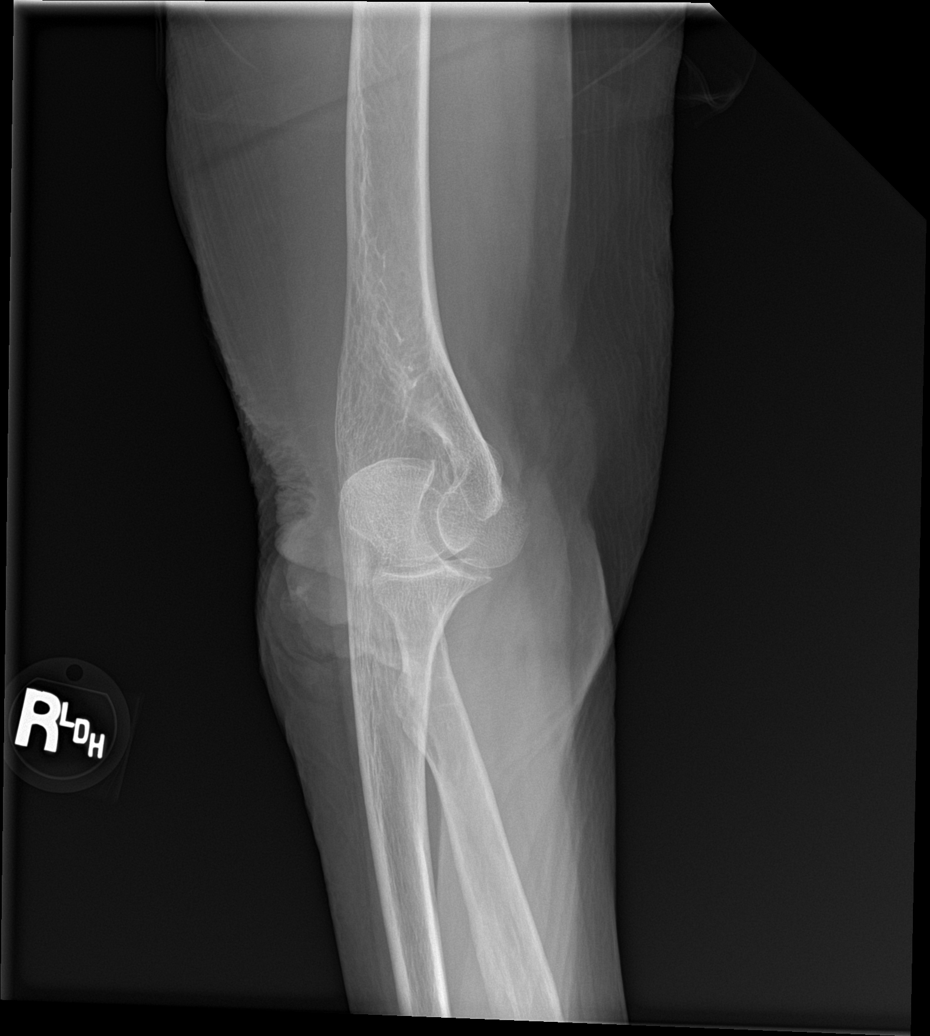
[im 4/4]
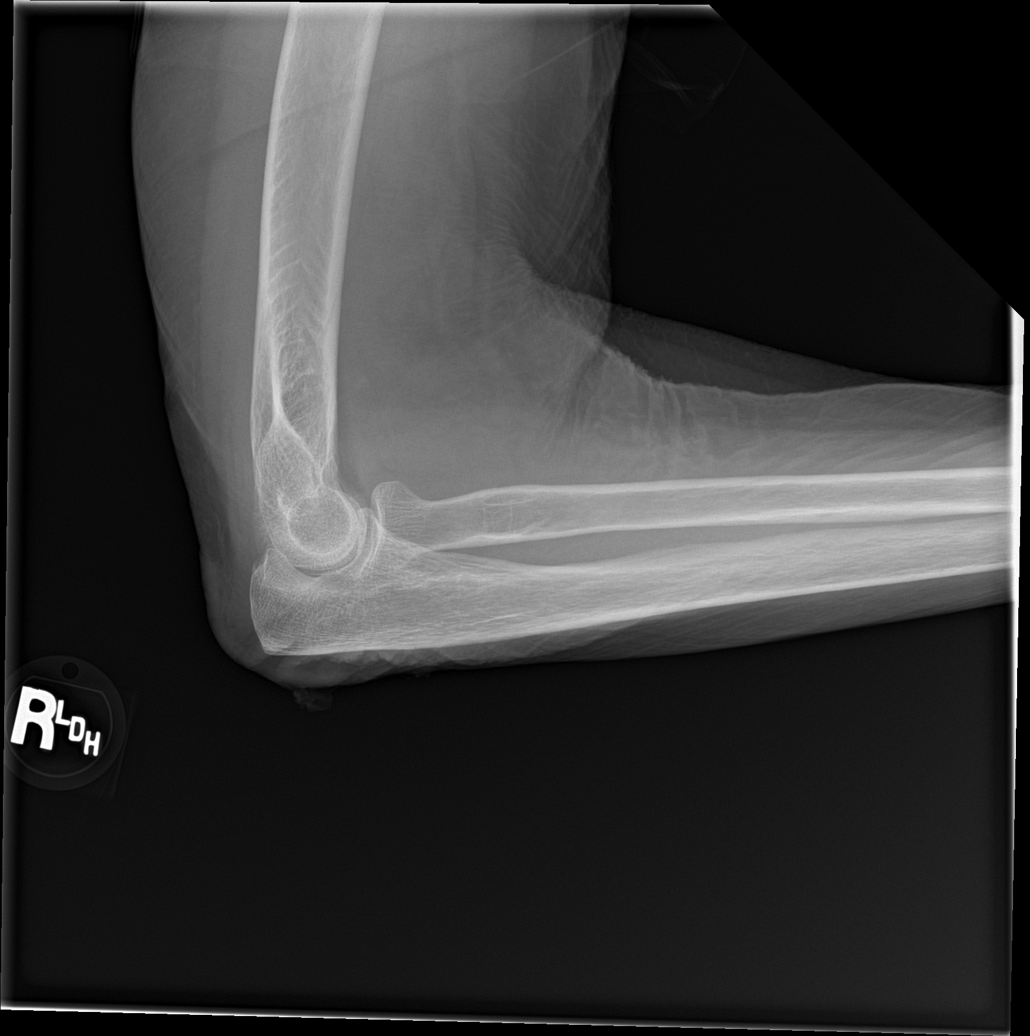

[4 of 4 positions shown; findings below may reference images not displayed]

FINDINGS: Skin irregularity or bandage posteriorly. No fracture, subluxation,
or joint effusion. Osteopenia. No degenerative finding
IMPRESSION: Negative for fracture or subluxation.

## 2021-10-01 DEATH — deceased
